# Patient Record
Sex: Female | Born: 1985 | Race: White | Hispanic: No | Marital: Married | Smoking: Never smoker
Health system: Southern US, Community
[De-identification: ages and names within clinical notes are randomized; demographics above are authoritative.]

## PROBLEM LIST (undated history)

## (undated) ENCOUNTER — Inpatient Hospital Stay (HOSPITAL_COMMUNITY): Payer: Self-pay

## (undated) DIAGNOSIS — K589 Irritable bowel syndrome without diarrhea: Secondary | ICD-10-CM

## (undated) DIAGNOSIS — O4690 Antepartum hemorrhage, unspecified, unspecified trimester: Secondary | ICD-10-CM

## (undated) DIAGNOSIS — N390 Urinary tract infection, site not specified: Secondary | ICD-10-CM

## (undated) DIAGNOSIS — Z8619 Personal history of other infectious and parasitic diseases: Secondary | ICD-10-CM

## (undated) DIAGNOSIS — I8390 Asymptomatic varicose veins of unspecified lower extremity: Secondary | ICD-10-CM

## (undated) DIAGNOSIS — F4322 Adjustment disorder with anxiety: Secondary | ICD-10-CM

## (undated) DIAGNOSIS — Z87898 Personal history of other specified conditions: Secondary | ICD-10-CM

## (undated) DIAGNOSIS — F909 Attention-deficit hyperactivity disorder, unspecified type: Secondary | ICD-10-CM

## (undated) DIAGNOSIS — J309 Allergic rhinitis, unspecified: Secondary | ICD-10-CM

## (undated) DIAGNOSIS — IMO0001 Reserved for inherently not codable concepts without codable children: Secondary | ICD-10-CM

## (undated) DIAGNOSIS — R51 Headache: Secondary | ICD-10-CM

## (undated) DIAGNOSIS — G5790 Unspecified mononeuropathy of unspecified lower limb: Secondary | ICD-10-CM

## (undated) DIAGNOSIS — N3010 Interstitial cystitis (chronic) without hematuria: Secondary | ICD-10-CM

## (undated) DIAGNOSIS — K602 Anal fissure, unspecified: Secondary | ICD-10-CM

## (undated) DIAGNOSIS — Z111 Encounter for screening for respiratory tuberculosis: Secondary | ICD-10-CM

## (undated) DIAGNOSIS — M545 Low back pain, unspecified: Secondary | ICD-10-CM

## (undated) DIAGNOSIS — N200 Calculus of kidney: Secondary | ICD-10-CM

## (undated) DIAGNOSIS — D649 Anemia, unspecified: Secondary | ICD-10-CM

## (undated) DIAGNOSIS — R0789 Other chest pain: Secondary | ICD-10-CM

## (undated) DIAGNOSIS — I493 Ventricular premature depolarization: Secondary | ICD-10-CM

## (undated) HISTORY — DX: Personal history of other specified conditions: Z87.898

## (undated) HISTORY — DX: Encounter for screening for respiratory tuberculosis: Z11.1

## (undated) HISTORY — DX: Ventricular premature depolarization: I49.3

## (undated) HISTORY — DX: Allergic rhinitis, unspecified: J30.9

## (undated) HISTORY — DX: Attention-deficit hyperactivity disorder, unspecified type: F90.9

## (undated) HISTORY — DX: Urinary tract infection, site not specified: N39.0

## (undated) HISTORY — DX: Reserved for inherently not codable concepts without codable children: IMO0001

## (undated) HISTORY — DX: Interstitial cystitis (chronic) without hematuria: N30.10

## (undated) HISTORY — DX: Anemia, unspecified: D64.9

## (undated) HISTORY — DX: Personal history of other infectious and parasitic diseases: Z86.19

## (undated) HISTORY — PX: BREAST SURGERY: SHX581

## (undated) HISTORY — DX: Antepartum hemorrhage, unspecified, unspecified trimester: O46.90

## (undated) HISTORY — DX: Low back pain, unspecified: M54.50

## (undated) HISTORY — DX: Irritable bowel syndrome, unspecified: K58.9

## (undated) HISTORY — PX: BREAST IMPLANT REMOVAL: SUR1101

## (undated) HISTORY — DX: Unspecified mononeuropathy of unspecified lower limb: G57.90

## (undated) HISTORY — DX: Low back pain: M54.5

## (undated) HISTORY — PX: ENDOVENOUS ABLATION SAPHENOUS VEIN W/ LASER: SUR449

## (undated) HISTORY — DX: Headache: R51

## (undated) HISTORY — DX: Anal fissure, unspecified: K60.2

## (undated) HISTORY — DX: Adjustment disorder with anxiety: F43.22

## (undated) HISTORY — DX: Other chest pain: R07.89

## (undated) HISTORY — DX: Asymptomatic varicose veins of unspecified lower extremity: I83.90

---
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
## 2005-01-04 ENCOUNTER — Ambulatory Visit: Payer: Self-pay | Admitting: Cardiology

## 2005-01-15 ENCOUNTER — Ambulatory Visit: Payer: Self-pay | Admitting: Gastroenterology

## 2005-01-17 ENCOUNTER — Emergency Department (HOSPITAL_COMMUNITY): Admission: EM | Admit: 2005-01-17 | Discharge: 2005-01-18 | Payer: Self-pay | Admitting: Emergency Medicine

## 2005-02-04 ENCOUNTER — Other Ambulatory Visit: Admission: RE | Admit: 2005-02-04 | Discharge: 2005-02-04 | Payer: Self-pay | Admitting: Obstetrics and Gynecology

## 2005-05-01 ENCOUNTER — Ambulatory Visit: Payer: Self-pay | Admitting: Internal Medicine

## 2005-06-03 ENCOUNTER — Encounter: Payer: Self-pay | Admitting: Internal Medicine

## 2005-06-07 ENCOUNTER — Ambulatory Visit (HOSPITAL_COMMUNITY): Admission: RE | Admit: 2005-06-07 | Discharge: 2005-06-07 | Payer: Self-pay | Admitting: Urology

## 2005-10-21 ENCOUNTER — Ambulatory Visit: Payer: Self-pay | Admitting: Internal Medicine

## 2006-02-06 ENCOUNTER — Other Ambulatory Visit: Admission: RE | Admit: 2006-02-06 | Discharge: 2006-02-06 | Payer: Self-pay | Admitting: Obstetrics and Gynecology

## 2006-04-05 ENCOUNTER — Emergency Department (HOSPITAL_COMMUNITY): Admission: EM | Admit: 2006-04-05 | Discharge: 2006-04-05 | Payer: Self-pay | Admitting: Emergency Medicine

## 2006-05-30 ENCOUNTER — Ambulatory Visit: Payer: Self-pay | Admitting: Internal Medicine

## 2006-06-04 ENCOUNTER — Ambulatory Visit: Payer: Self-pay | Admitting: Internal Medicine

## 2006-06-14 ENCOUNTER — Emergency Department (HOSPITAL_COMMUNITY): Admission: EM | Admit: 2006-06-14 | Discharge: 2006-06-14 | Payer: Self-pay | Admitting: Emergency Medicine

## 2006-06-16 ENCOUNTER — Ambulatory Visit: Payer: Self-pay | Admitting: Internal Medicine

## 2006-06-19 ENCOUNTER — Ambulatory Visit: Payer: Self-pay | Admitting: Gastroenterology

## 2006-06-19 LAB — CONVERTED CEMR LAB
Crystals: NEGATIVE
Hemoglobin, Urine: NEGATIVE
Ketones, ur: NEGATIVE mg/dL
Nitrite: POSITIVE — AB
Specific Gravity, Urine: 1.02 (ref 1.000–1.03)
Total Protein, Urine: NEGATIVE mg/dL
Urine Glucose: NEGATIVE mg/dL
Urobilinogen, UA: 0.2 (ref 0.0–1.0)
pH: 6.5 (ref 5.0–8.0)

## 2006-06-23 ENCOUNTER — Ambulatory Visit: Payer: Self-pay | Admitting: Gastroenterology

## 2006-07-05 ENCOUNTER — Ambulatory Visit: Payer: Self-pay | Admitting: Family Medicine

## 2006-07-10 ENCOUNTER — Ambulatory Visit: Payer: Self-pay | Admitting: Gastroenterology

## 2006-10-07 ENCOUNTER — Ambulatory Visit: Payer: Self-pay | Admitting: Internal Medicine

## 2007-01-09 ENCOUNTER — Ambulatory Visit: Payer: Self-pay | Admitting: Family Medicine

## 2007-01-14 ENCOUNTER — Ambulatory Visit: Payer: Self-pay | Admitting: Internal Medicine

## 2007-01-23 DIAGNOSIS — R51 Headache: Secondary | ICD-10-CM

## 2007-01-23 DIAGNOSIS — R519 Headache, unspecified: Secondary | ICD-10-CM | POA: Insufficient documentation

## 2007-01-29 ENCOUNTER — Encounter: Payer: Self-pay | Admitting: Internal Medicine

## 2007-02-28 IMAGING — US US TRANSVAGINAL NON-OB
2 series · 13 of 25 positions shown · non-contrast
Comparison: None.

CLINICAL DATA: Left lower quadrant abdominal abdominal and left pelvic pain.
Recent normal abdominal ultrasound.

TRANSABDOMINAL AND TRANSVAGINAL PELVIC ULTRASOUND 11/28/2004:
TECHNIQUE: Initially, transabdominal imaging was performed using the bladder as
an acoustical window. Subsequently, the bladder was emptied and transvaginal
imaging was performed.

[Series 1: unknown · 0.13mm/px · 8 of 36 slices shown (1 of 2)]
[im 1/36]
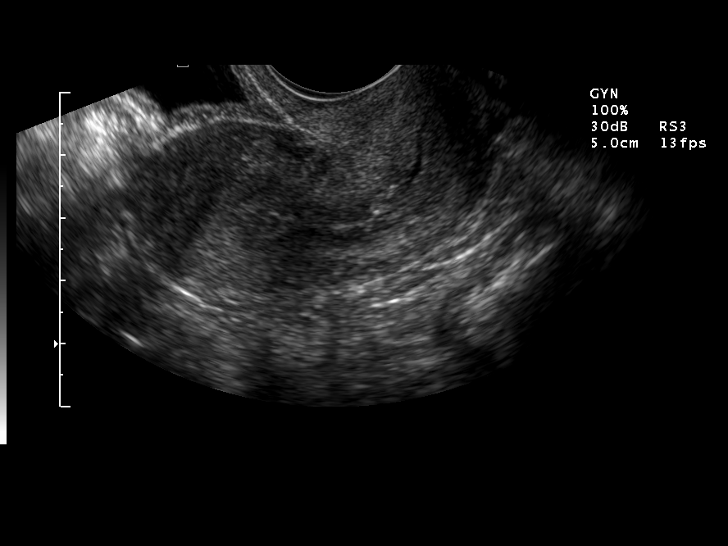
[im 5/36]
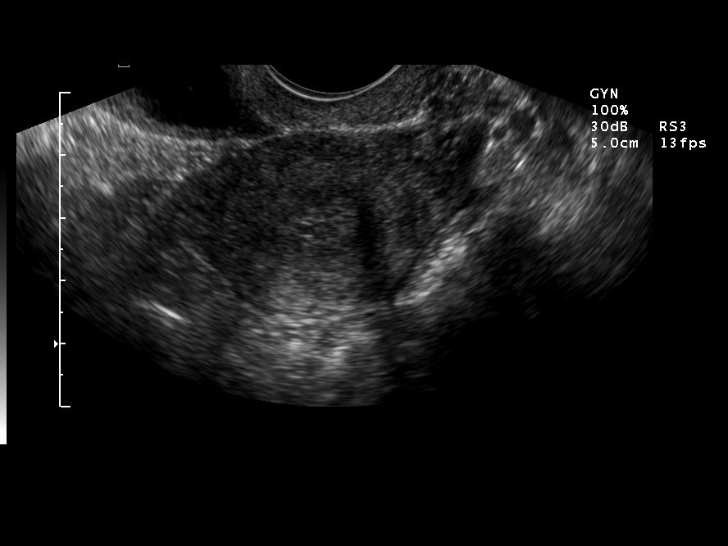
[im 10/36]
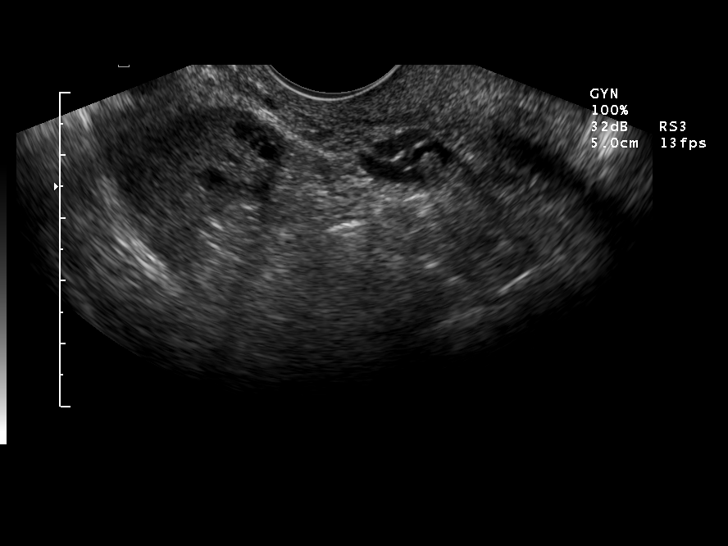
[im 15/36]
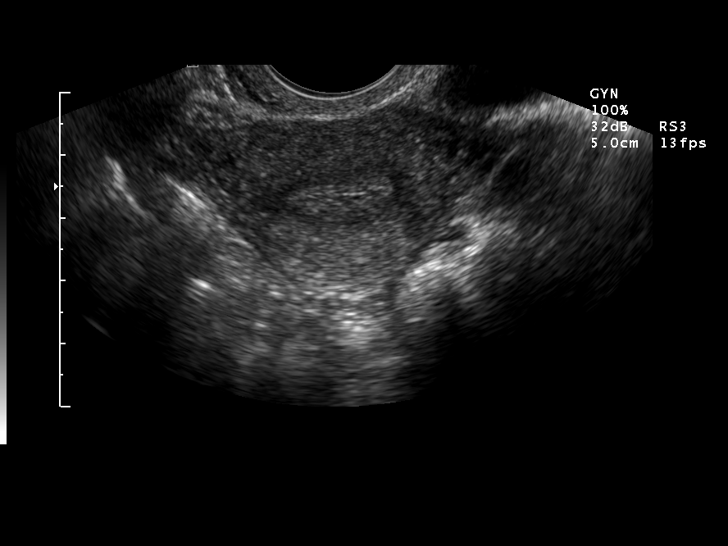
[im 19/36]
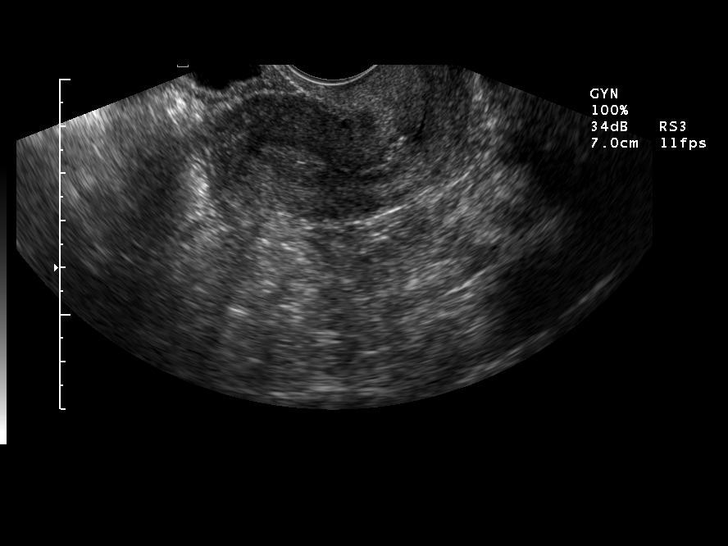
[im 24/36]
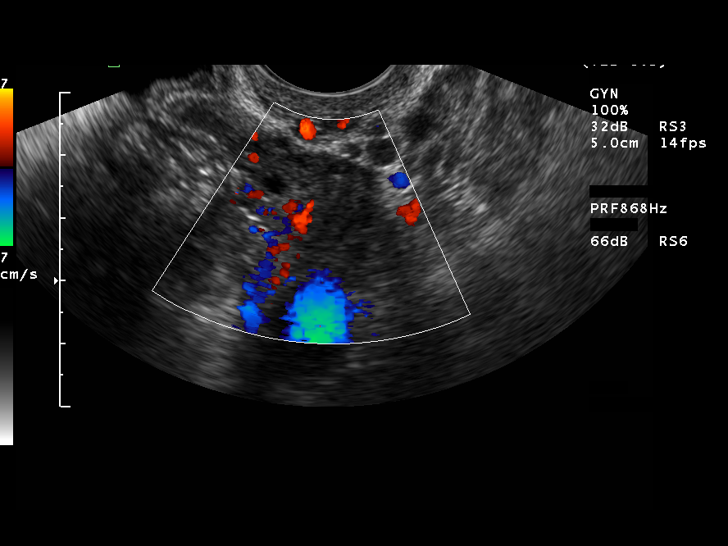
[im 29/36]
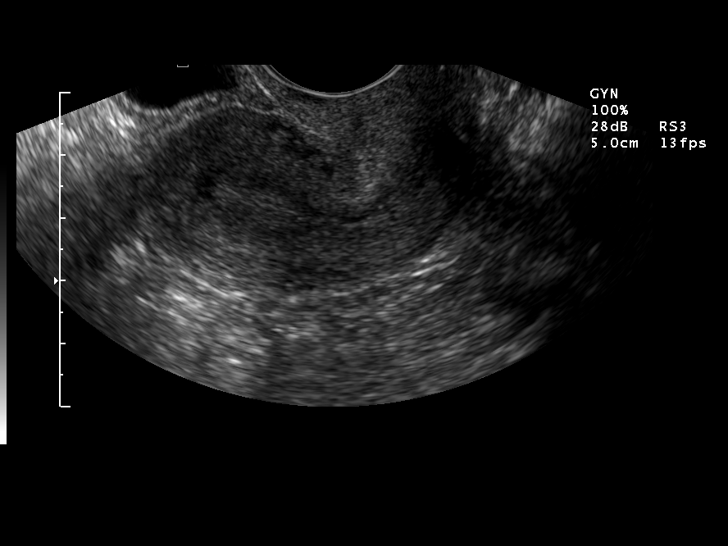
[im 33/36]
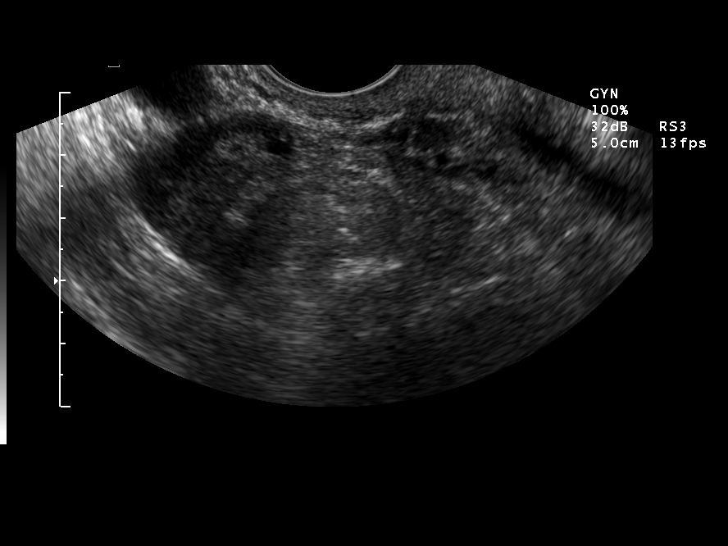

[Series 1: unknown · 0.35mm/px · 5 of 19 slices shown (2 of 2)]
[im 1/19]
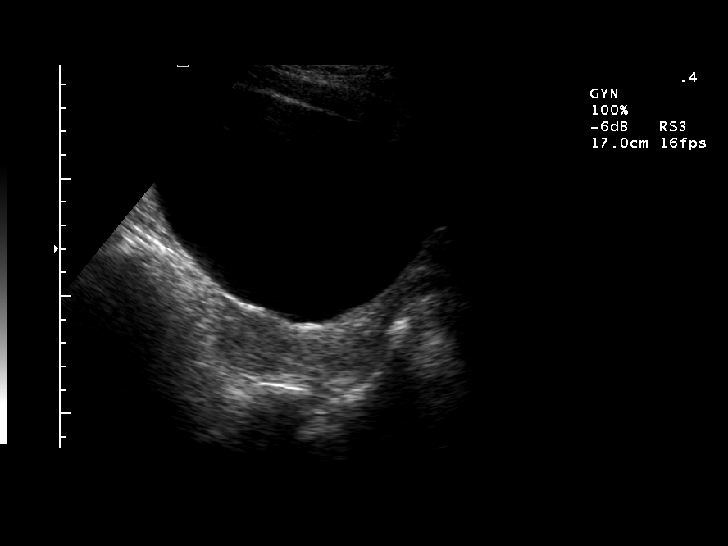
[im 5/19]
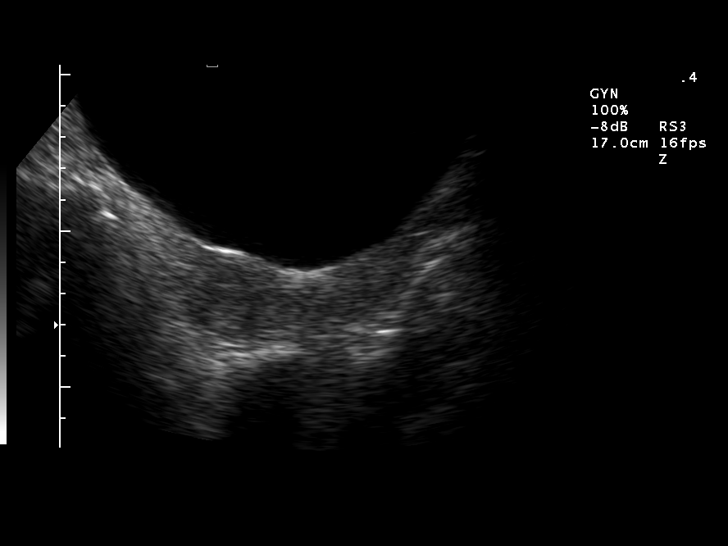
[im 10/19]
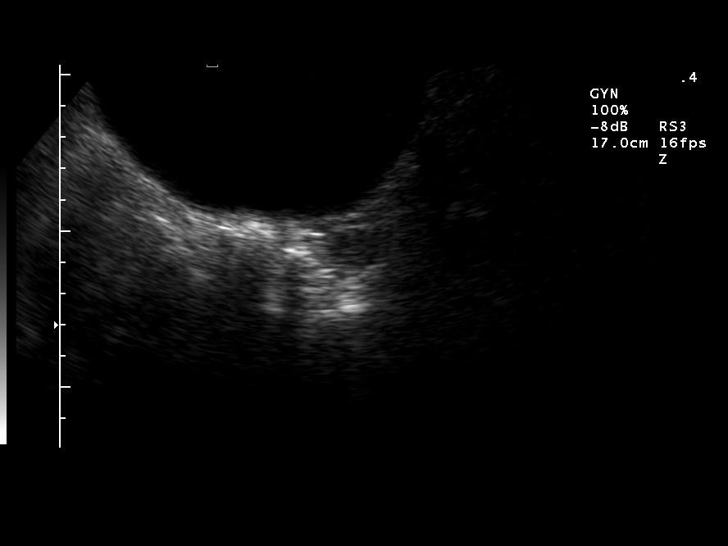
[im 14/19]
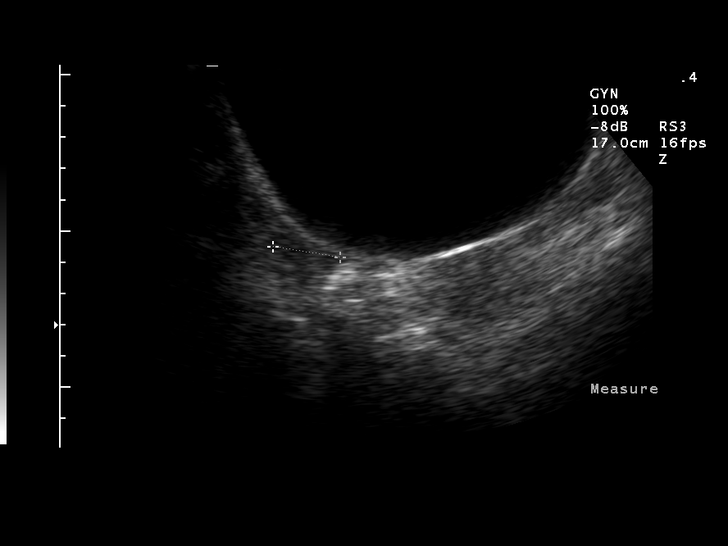
[im 19/19]
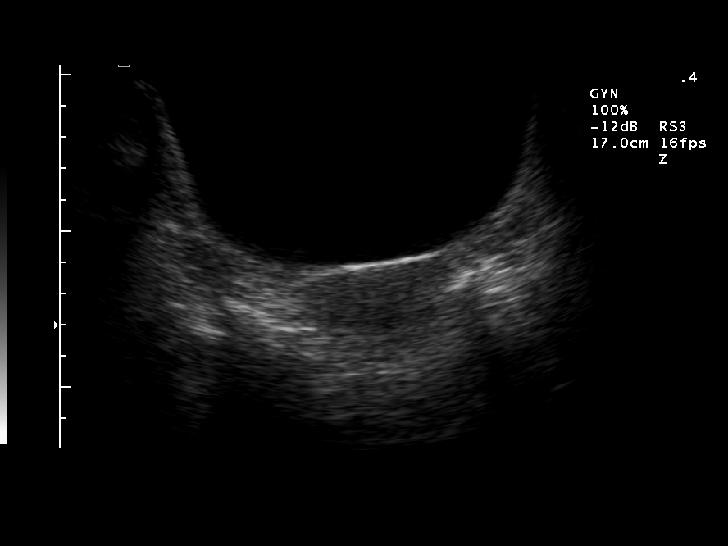

[13 of 25 positions shown; findings below may reference images not displayed]

FINDINGS: The uterus is normal in size and appearance and measures
approximately 5.9 x 3.1 x 3.9 cm. No focal myometrial masses are identified. The
endometrium is normal and trilayered and measures approximately 7 mm in
thickness.

Within the right ovary there is an approximately 0.7 x 0.5 x 0.6 cm echogenic
focus which likely represents an involuting follicle. No significant
abnormalities are identified involving the right ovary. It measures
approximately 2.7 x 2.3 x 2.8 cm. The left ovary is normal in size in appearance
and contains several follicles; it measures approximately 2.7 x 1.7 x 2.2 cm. No
adnexal masses or free fluid were identified.
IMPRESSION: Normal pelvic ultrasound.

## 2007-04-15 ENCOUNTER — Encounter: Admission: RE | Admit: 2007-04-15 | Discharge: 2007-04-15 | Payer: Self-pay | Admitting: Sports Medicine

## 2007-05-07 ENCOUNTER — Encounter: Payer: Self-pay | Admitting: Internal Medicine

## 2008-02-12 ENCOUNTER — Ambulatory Visit: Payer: Self-pay | Admitting: Family Medicine

## 2008-02-12 DIAGNOSIS — F909 Attention-deficit hyperactivity disorder, unspecified type: Secondary | ICD-10-CM | POA: Insufficient documentation

## 2008-08-16 ENCOUNTER — Encounter: Payer: Self-pay | Admitting: Internal Medicine

## 2008-08-17 ENCOUNTER — Ambulatory Visit: Payer: Self-pay | Admitting: Internal Medicine

## 2008-08-17 DIAGNOSIS — G5790 Unspecified mononeuropathy of unspecified lower limb: Secondary | ICD-10-CM | POA: Insufficient documentation

## 2008-08-17 DIAGNOSIS — IMO0001 Reserved for inherently not codable concepts without codable children: Secondary | ICD-10-CM

## 2008-08-17 DIAGNOSIS — N390 Urinary tract infection, site not specified: Secondary | ICD-10-CM | POA: Insufficient documentation

## 2008-08-17 DIAGNOSIS — J309 Allergic rhinitis, unspecified: Secondary | ICD-10-CM | POA: Insufficient documentation

## 2008-09-08 ENCOUNTER — Ambulatory Visit: Payer: Self-pay | Admitting: Internal Medicine

## 2008-11-23 ENCOUNTER — Telehealth: Payer: Self-pay | Admitting: Internal Medicine

## 2009-05-16 ENCOUNTER — Telehealth: Payer: Self-pay | Admitting: Internal Medicine

## 2009-07-01 HISTORY — PX: BREAST ENHANCEMENT SURGERY: SHX7

## 2009-07-04 ENCOUNTER — Ambulatory Visit: Payer: Self-pay | Admitting: Internal Medicine

## 2009-07-31 ENCOUNTER — Emergency Department (HOSPITAL_COMMUNITY): Admission: EM | Admit: 2009-07-31 | Discharge: 2009-07-31 | Payer: Self-pay | Admitting: Emergency Medicine

## 2009-11-10 ENCOUNTER — Ambulatory Visit: Payer: Self-pay | Admitting: Internal Medicine

## 2009-11-10 DIAGNOSIS — K602 Anal fissure, unspecified: Secondary | ICD-10-CM | POA: Insufficient documentation

## 2009-12-04 ENCOUNTER — Telehealth: Payer: Self-pay | Admitting: Internal Medicine

## 2010-01-02 ENCOUNTER — Ambulatory Visit: Payer: Self-pay | Admitting: Internal Medicine

## 2010-01-02 DIAGNOSIS — J45990 Exercise induced bronchospasm: Secondary | ICD-10-CM

## 2010-02-21 ENCOUNTER — Telehealth: Payer: Self-pay | Admitting: Internal Medicine

## 2010-03-26 ENCOUNTER — Telehealth: Payer: Self-pay | Admitting: Internal Medicine

## 2010-04-03 ENCOUNTER — Telehealth: Payer: Self-pay | Admitting: *Deleted

## 2010-05-08 ENCOUNTER — Ambulatory Visit: Payer: Self-pay | Admitting: Internal Medicine

## 2010-05-08 DIAGNOSIS — M549 Dorsalgia, unspecified: Secondary | ICD-10-CM | POA: Insufficient documentation

## 2010-05-10 ENCOUNTER — Ambulatory Visit: Payer: Self-pay | Admitting: Internal Medicine

## 2010-05-17 ENCOUNTER — Ambulatory Visit: Payer: Self-pay | Admitting: Internal Medicine

## 2010-05-17 ENCOUNTER — Telehealth: Payer: Self-pay | Admitting: *Deleted

## 2010-05-17 LAB — CONVERTED CEMR LAB
Bilirubin Urine: NEGATIVE
Glucose, Urine, Semiquant: NEGATIVE
Ketones, urine, test strip: NEGATIVE
Nitrite: NEGATIVE
Protein, U semiquant: NEGATIVE
Specific Gravity, Urine: 1.01
Urobilinogen, UA: 0.2
pH: 7.5

## 2010-06-07 ENCOUNTER — Encounter: Payer: Self-pay | Admitting: Internal Medicine

## 2010-07-31 NOTE — Assessment & Plan Note (Signed)
Summary: F/U FOR MED REFILL//SLM   Vital Signs:  Patient profile:   25 year old female Menstrual status:  regular LMP:     06/12/2009 Height:      67.75 inches Weight:      184 pounds BMI:     28.29 Pulse rate:   60 / minute BP sitting:   100 / 60  (right arm) Cuff size:   regular  Vitals Entered By: Romualdo Bolk, CMA (AAMA) (July 04, 2009 1:41 PM) CC: Follow-up visit on add meds LMP (date): 06/12/2009 LMP - Character: light Menarche (age onset years): 14-15   Menses interval (days): 28 Menstrual flow (days): 3 Enter LMP: 06/12/2009   History of Present Illness: Alexandra [name] comes in for follow up  medication. Since last visit  here  there have been no major changes in health status  .  Last ov 3.10  she has graduated from college . exercising some but not lacrosse.  #ADHD:uses  medication and works at  [employer] .   considering pharmacy school  working 35-40 .   takes med for work.  no sig chest pain .  no associated symptom /  #Sleep  ok .   No mood    or   rare  cp .    with  breathing.     still physically active.  #Asthma allergy stable. #Sees gyne  reg no problems.  on hormonal therapy nuvaring #? Peripheral neuropathy feet: no change .    Preventive Screening-Counseling & Management  Alcohol-Tobacco     Alcohol drinks/day: 0     Smoking Status: never  Caffeine-Diet-Exercise     Caffeine use/day: 1     Does Patient Exercise: yes     Type of exercise: running     Exercise (avg: min/session): >60     Times/week: 7  Current Medications (verified): 1)  Macrobid 100 Mg Caps (Nitrofurantoin Monohyd Macro) .... Take 1 Capsule By Mouth Twice A Day As Needed 2)  Neurontin 600 Mg  Tabs (Gabapentin) .... One By Mouth Three Times A Day Per  Neurology Dr  Estella Husk 3)  Vicodin Es 7.5-750 Mg  Tabs (Hydrocodone-Acetaminophen) .... Prn 4)  Voltaren 75 Mg  Tbec (Diclofenac Sodium) .... As Needed For Back 5)  Adderall 20 Mg  Tabs (Amphetamine-Dextroamphetamine)  .... Take 1 Tablet By Mouth Two Times A Day 6)  Nuvaring 0.12-0.015 Mg/24hr Ring (Etonogestrel-Ethinyl Estradiol) .... As Directed 7)  Ventolin Hfa 108 (90 Base) Mcg/act Aers (Albuterol Sulfate) .Marland Kitchen.. 1-2 Puffs Pre Exercise   Trial  or As Directed 8)  Flonase 50 Mcg/act Susp (Fluticasone Propionate) .... 2 Spray Each Nares Q D  For Nose Allergy 9)  Singulair 10 Mg Tabs (Montelukast Sodium) 10)  Adderall 20 Mg Tabs (Amphetamine-Dextroamphetamine) .Marland Kitchen.. 1 By Mouth Two Times A Day Fill On or After 08/04/2009 11)  Adderall 20 Mg Tabs (Amphetamine-Dextroamphetamine) .Marland Kitchen.. 1 By Mouth Two Times A Day Fill On or After 09/01/2009  Allergies (verified): No Known Drug Allergies  Past History:  Past medical, surgical, family and social histories (including risk factors) reviewed, and no changes noted (except as noted below).  Past Medical History: Headache IBS  colonoscopy Jarold Motto ADD Echo for PVCs normal Cdifficile 2007  Chickenpox  recurrent UTIs  Earlene Plater 2006 Foot pain  ? neuropathy ? fibromyalgia  Recurrent UTI hx   Past History:  Care Management: Neurology: [facility]. Gynecology:[facility]son.  Family History: Reviewed history from 09/08/2008 and no changes required. Family  History Diabetes 1st degree relative    No sudden death arrythmias  SD     Father:  high lipids   ? inhaler     Mother:  HBP     Siblings: well.        Social History: Reviewed history from 09/08/2008 and no changes required.  Single    [employer] college   finished school now at home . hh of 3     2 hh    pet dog and cat exposure     no alcohol    ? sweet tea a day max     goes to gyme at times.        Review of Systems  The patient denies anorexia, fever, weight loss, weight gain, vision loss, decreased hearing, hoarseness, syncope, peripheral edema, prolonged cough, hemoptysis, abdominal pain, melena, hematochezia, severe indigestion/heartburn, hematuria, suspicious skin lesions, transient  blindness, difficulty walking, unusual weight change, abnormal bleeding, enlarged lymph nodes, angioedema, and breast masses.    Physical Exam  General:  Well-developed,well-nourished,in no acute distress; alert,appropriate and cooperative throughout examination Head:  normocephalic and atraumatic.   Eyes:  vision grossly intact, pupils equal, and pupils round.   Ears:  R ear normal, L ear normal, and no external deformities.   Mouth:  pharynx pink and moist.   Neck:  No deformities, masses, or tenderness noted. Lungs:  Normal respiratory effort, chest expands symmetrically. Lungs are clear to auscultation, no crackles or wheezes.no dullness.   Heart:  Normal rate and regular rhythm. S1 and S2 normal without gallop, murmur, click, rub or other extra sounds.no lifts.   Abdomen:  Bowel sounds positive,abdomen soft and non-tender without masses, organomegaly or   noted. Extremities:  no clubbing cyanosis or edema  Neurologic:  alert & oriented X3, strength normal in all extremities, and gait normal.   Skin:  turgor normal and color normal.   Cervical Nodes:  No lymphadenopathy noted Psych:  Oriented X3, good eye contact, not anxious appearing, and not depressed appearing.     Impression & Recommendations:  Problem # 1:  ATTENTION DEFICIT HYPERACTIVITY DISORDER, ADULT (ICD-314.01) stable  with med  helping and no untoward se.    Problem # 2:  ALLERGIC RHINITIS (ICD-477.9) stable Her updated medication list for this problem includes:    Flonase 50 Mcg/act Susp (Fluticasone propionate) .Marland Kitchen... 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 Take 1 capsule by mouth twice a day as needed  Complete Medication List: 1)  Macrobid 100 Mg Caps (Nitrofurantoin monohyd macro) .... Take 1 capsule by mouth  twice a day as needed 2)  Neurontin 600 Mg Tabs (Gabapentin) .... One by mouth three times a day per  neurology dr  Estella Husk 3)  Vicodin Es 7.5-750 Mg Tabs (Hydrocodone-acetaminophen) .... Prn 4)  Voltaren 75 Mg Tbec (Diclofenac sodium) .... As needed for back 5)  Adderall 20 Mg Tabs (Amphetamine-dextroamphetamine) .... Take 1 tablet by mouth two times a day 6)  Nuvaring 0.12-0.015 Mg/24hr Ring (Etonogestrel-ethinyl estradiol) .... As directed 7)  Ventolin Hfa 108 (90 Base) Mcg/act Aers (Albuterol sulfate) .Marland Kitchen.. 1-2 puffs pre exercise   trial  or as directed 8)  Flonase 50 Mcg/act Susp (Fluticasone propionate) .... 2 spray each nares q d  for nose allergy 9)  Singulair 10 Mg Tabs (Montelukast sodium) 10)  Adderall 20 Mg Tabs (Amphetamine-dextroamphetamine) .Marland Kitchen.. 1 by mouth two times a day fill on or after 08/04/2009 11)  Adderall 20 Mg Tabs (Amphetamine-dextroamphetamine) .Marland Kitchen.. 1 by mouth two times a day fill on or after 09/01/2009  Patient Instructions: 1)  Please schedule a follow-up appointment in 6 months .  Prescriptions: ADDERALL 20 MG TABS (AMPHETAMINE-DEXTROAMPHETAMINE) 1 by mouth two times a day FILL ON OR AFTER 09/01/2009  #60 x 0   Entered by:   Romualdo Bolk, CMA (AAMA)   Authorized by:   Madelin Headings MD   Signed by:   Romualdo Bolk, CMA (AAMA) on 07/04/2009   Method used:   Print then Give to Patient   RxID:   [id number] ADDERALL 20 MG TABS (AMPHETAMINE-DEXTROAMPHETAMINE) 1 by mouth two times a day FILL ON OR AFTER 08/04/2009  #60 x 0   Entered by:   Romualdo Bolk, CMA (AAMA)   Authorized by:   Madelin Headings MD   Signed by:   Romualdo Bolk, CMA (AAMA) on 07/04/2009   Method used:   Print then Give to Patient   RxID:   [id number] ADDERALL 20 MG  TABS (AMPHETAMINE-DEXTROAMPHETAMINE) Take 1 tablet by mouth two times a day  #60 x 0   Entered by:   Romualdo Bolk, CMA (AAMA)   Authorized by:   Madelin Headings MD   Signed by:   Romualdo Bolk, CMA  (AAMA) on 07/04/2009   Method used:   Print then Give to Patient   RxID:   [id number]

## 2010-07-31 NOTE — Progress Notes (Signed)
Summary: refill  Phone Note Refill Request Call back at [phone] Message from:  Patient---live call  Refills Requested: Medication #1:  ADDERALL 20 MG  TABS Take 1 tablet by mouth two times a day call pt when ready.  Initial call taken by: Warnell Forester,  February 21, 2010 3:31 PM  Follow-up for Phone Call        LM on machine rx ready to pick up Follow-up by: Romualdo Bolk, CMA Duncan Dull),  February 21, 2010 5:36 PM    Prescriptions: ADDERALL 20 MG  TABS (AMPHETAMINE-DEXTROAMPHETAMINE) Take 1 tablet by mouth two times a day  #60 x 0   Entered by:   Romualdo Bolk, CMA (AAMA)   Authorized by:   Madelin Headings MD   Signed by:   Romualdo Bolk, CMA (AAMA) on 02/21/2010   Method used:   Print then Give to Patient   RxID:   [id number]

## 2010-07-31 NOTE — Progress Notes (Signed)
Summary: refill  Phone Note Refill Request Call back at [phone] Message from:  Patient---live call  Refills Requested: Medication #1:  ADDERALL 20 MG  TABS Take 1 tablet by mouth two times a day call pt when ready.  Initial call taken by: Warnell Forester,  December 04, 2009 9:12 AM  Follow-up for Phone Call        Pt needs a meds ck before next refill. Left message on machine that rx is ready to pick up. Follow-up by: Romualdo Bolk, CMA (AAMA),  December 04, 2009 9:14 AM    Prescriptions: ADDERALL 20 MG  TABS (AMPHETAMINE-DEXTROAMPHETAMINE) Take 1 tablet by mouth two times a day  #60 x 0   Entered by:   Romualdo Bolk, CMA (AAMA)   Authorized by:   Madelin Headings MD   Signed by:   Romualdo Bolk, CMA (AAMA) on 12/04/2009   Method used:   Print then Give to Patient   RxID:   [id number]

## 2010-07-31 NOTE — Assessment & Plan Note (Signed)
Summary: UTI/dm   Vital Signs:  Patient profile:   25 year old female Menstrual status:  regular Weight:      175 pounds Temp:     97.7 degrees F oral BP sitting:   120 / 70  (left arm) Cuff size:   regular  Vitals Entered By: Duard Brady LPN (May 17, 2010 3:12 PM) CC: c/o back pain - feels like fire Is Patient Diabetic? No   CC:  c/o back pain - feels like fire.  History of Present Illness: 25 year old patient who has a history of chronic UTI, as well as chronic low back pain.  She is followed by neurology for a peripheral neuropathy and has a history of ADD as well as fibromyalgia.  For the past few weeks.  She has had some intermittent burning right lumbar pain.  This has intensified over the past 3 days.  She works at a [employer] standing from 9 to 5- and she feels  that the  prolonged standing aggravates the pain  Preventive Screening-Counseling & Management  Alcohol-Tobacco     Smoking Status: never  Allergies (verified): No Known Drug Allergies  Past History:  Past Medical History: Reviewed history from 01/02/2010 and no changes required. Headache IBS  colonoscopy Jarold Motto ADD Echo for PVCs normal Cdifficile 2007  Chickenpox  recurrent UTIs  Earlene Plater 2006 Foot pain  ? neuropathy  back radiating pain  to feet  ? fibromyalgia  Recurrent UTI hx   Family History: Reviewed history from 09/08/2008 and no changes required. Family History Diabetes 1st degree relative    No sudden death arrythmias  SD     Father:  high lipids   ? inhaler     Mother:  HBP     Siblings: well.        Social History: Reviewed history from 11/10/2009 and no changes required.  Single    [employer] college   finished school now at home . hh of 3     2 hh    pet dog and cat exposure     no alcohol    ? sweet tea a day max     goes to gym at times.  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        Review of Systems  The patient denies anorexia, fever, weight loss, weight  gain, vision loss, decreased hearing, hoarseness, chest pain, syncope, dyspnea on exertion, peripheral edema, prolonged cough, headaches, hemoptysis, abdominal pain, melena, hematochezia, severe indigestion/heartburn, hematuria, incontinence, genital sores, muscle weakness, suspicious skin lesions, transient blindness, difficulty walking, depression, unusual weight change, abnormal bleeding, enlarged lymph nodes, angioedema, and breast masses.    Physical Exam  General:  overweight-appearing.  overweight-appearing.   Msk:   Tenderness in the right lumbar area to palpation.  No inflammatory signs.  Straight leg testing negative.  Range-of-motion of the right hip intact;  internal rotation to 10 aggravate the pain somewhat   Impression & Recommendations:  Problem # 1:  BACK PAIN, LUMBOSACRAL, CHRONIC (ICD-724.5)  Her updated medication list for this problem includes:    Vicodin Es 7.5-750 Mg Tabs (Hydrocodone-acetaminophen) .Marland Kitchen... Prn    Voltaren 75 Mg Tbec (Diclofenac sodium) .Marland Kitchen... As needed for back  not taking  currently  05/2010  Orders: UA Dipstick W/ Micro (manual) (24401) Depo- Medrol 80mg  (J1040) Admin of Therapeutic Inj  intramuscular or subcutaneous (02725)  Her updated medication list for this problem includes:    Vicodin Es 7.5-750 Mg Tabs (Hydrocodone-acetaminophen) .Marland Kitchen... Prn  Voltaren 75 Mg Tbec (Diclofenac sodium) .Marland Kitchen... 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 Take 1 capsule by mouth twice a day as needed  Her updated medication list for this problem includes:    Macrobid 100 Mg Caps (Nitrofurantoin monohyd macro) .Marland Kitchen... Take 1 capsule by mouth twice a day as needed  Complete Medication List: 1)  Macrobid 100 Mg Caps (Nitrofurantoin monohyd macro) .... Take 1 capsule by mouth  twice a day as needed 2)  Neurontin 600 Mg Tabs (Gabapentin) .... One by mouth three times a day per  neurology dr  Estella Husk  for back pain. 3)  Vicodin Es 7.5-750 Mg Tabs (Hydrocodone-acetaminophen) .... Prn 4)  Voltaren 75 Mg Tbec (Diclofenac sodium) .... As needed for back  not taking  currently  05/2010 5)  Adderall 20 Mg Tabs (Amphetamine-dextroamphetamine) .... Take 1 tablet by mouth two times a day 6)  Nuvaring 0.12-0.015 Mg/24hr Ring (Etonogestrel-ethinyl estradiol) .... As directed 7)  Ventolin Hfa 108 (90 Base) Mcg/act Aers (Albuterol sulfate) .Marland Kitchen.. 1-2 puffs pre exercise   trial  or as directed 8)  Flonase 50 Mcg/act Susp (Fluticasone propionate) .... 2 spray each nares q d  for nose allergy 9)  Singulair 10 Mg Tabs (Montelukast sodium) 10)  Adderall 30 Mg Tabs (Amphetamine-dextroamphetamine) .Marland Kitchen.. 1 by mouth qam and 1 1/2 in pm 11)  Adderall 20 Mg Tabs (Amphetamine-dextroamphetamine) .Marland Kitchen.. 1 by mouth two times a day fill on or after 05/26/10 12)  Hemorrhoidal-hc 25 Mg Supp (Hydrocortisone acetate) .... Use two times a day for up to 2 weeks 13)  Sumatriptan Succinate 100 Mg Tabs (Sumatriptan succinate) .Marland Kitchen.. 1 by mouth  at onset  and can repeat in 2-4 hours  Patient Instructions: 1)  Most patients (90%) with low back pain will improve with time (2-6 weeks). Keep active but avoid activities that are painful. Apply moist heat and/or ice to lower back several times a day.   Medication Administration  Injection # 1:    Medication: Depo- Medrol 80mg     Diagnosis: BACK PAIN, LUMBOSACRAL, CHRONIC (ICD-724.5)    Route: IM    Site: RUOQ gluteus    Exp Date: 10/2012    Lot #: [id number]    Mfr: [employer]    Patient tolerated injection without complications    Given by: Duard Brady LPN (May 17, 2010 4:03 PM)  Orders Added: 1)  UA Dipstick W/ Micro (manual) [81000] 2)  Est. Patient Level III [45409] 3)  Depo- Medrol 80mg  [J1040] 4)  Admin of Therapeutic Inj  intramuscular or  subcutaneous [96372]    Laboratory Results   Urine Tests  Date/Time Received: May 17, 2010 3:25 PM  Date/Time Reported: May 17, 2010 3:25 PM   Routine Urinalysis   Color: yellow Appearance: Clear Glucose: negative   (Normal Range: Negative) Bilirubin: negative   (Normal Range: Negative) Ketone: negative   (Normal Range: Negative) Spec. Gravity: 1.010   (Normal Range: 1.003-1.035) Blood: trace-lysed   (Normal Range: Negative) pH: 7.5   (Normal Range: 5.0-8.0) Protein: negative   (Normal Range: Negative) Urobilinogen: 0.2   (Normal Range: 0-1) Nitrite: negative   (Normal Range: Negative) Leukocyte Esterace: moderate   (Normal Range: Negative)

## 2010-07-31 NOTE — Letter (Signed)
Summary: Out of Work  Adult nurse at [employer]  [address]   [place], [place] [address]   Phone: [phone]  Fax: [phone]    May 17, 2010   Employee:  Eloise Levels    To Whom It May Concern:   For Medical reasons, please excuse the above named employee from work for the following dates:  Start:   05-18-10  End:   05-21-10  If you need additional information, please feel free to contact our office.         Sincerely,    Gordy Savers  MD

## 2010-07-31 NOTE — Assessment & Plan Note (Signed)
Summary: 6 month rov/njr   Vital Signs:  Patient profile:   25 year old female Menstrual status:  regular Weight:      175 pounds Pulse rate:   60 / minute Pulse rhythm:   regular BP sitting:   90 / 66  (left arm) Cuff size:   regular  Vitals Entered By: Raechel Ache, RN (January 02, 2010 10:39 AM) CC: 6 mo ROV.   History of Present Illness: Alexandra [name] comes in today   for follow up meds and problems. ADHD :  medication ..taking two times a day during  working.    working 5 days a week. to 6 week.  Am and then 1/2 to 1  No cp sob. No mood or sig sleep issues with this and seems to work well  Allergy asthma better  when not  in  spring. Feet   numbness   :  dx from back    was given empiiric rx for herpes /varicella  to see if changes  nymbness . Gi and has stable  Preventive Screening-Counseling & Management  Alcohol-Tobacco     Alcohol drinks/day: 0     Smoking Status: never  Safety-Violence-Falls     Seat Belt Use: yes  Allergies: No Known Drug Allergies  Past History:  Past medical, surgical, family and social histories (including risk factors) reviewed, and no changes noted (except as noted below).  Past Medical History: Headache IBS  colonoscopy Jarold Motto ADD Echo for PVCs normal Cdifficile 2007  Chickenpox  recurrent UTIs  Earlene Plater 2006 Foot pain  ? neuropathy  back radiating pain  to feet  ? fibromyalgia  Recurrent UTI hx   Family History: Reviewed history from 09/08/2008 and no changes required. Family History Diabetes 1st degree relative    No sudden death arrythmias  SD     Father:  high lipids   ? inhaler     Mother:  HBP     Siblings: well.        Social History: Reviewed history from 11/10/2009 and no changes required.  Single    [employer] college   finished school now at home . hh of 3     2 hh    pet dog and cat exposure     no alcohol    ? sweet tea a day max     goes to gym at times.  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     Seat Belt Use:  yes  Review of Systems  The patient denies chest pain, syncope, dyspnea on exertion, and peripheral edema.         able to run    had  radiculoar  signs in  feet from back     Physical Exam  General:  alert, well-developed, well-nourished, and well-hydrated.   Head:  normocephalic and atraumatic.   Eyes:  vision grossly intact, pupils equal, and pupils round.   Ears:  R ear normal and L ear normal.   Neck:  No deformities, masses, or tenderness noted. Lungs:  Normal respiratory effort, chest expands symmetrically. Lungs are clear to auscultation, no crackles or wheezes.no dullness.   Heart:  Normal rate and regular rhythm. S1 and S2 normal without gallop, murmur, click, rub or other extra sounds.no lifts.   Abdomen:  Bowel sounds positive,abdomen soft and non-tender without masses, organomegaly or   noted. Pulses:  nl cap refill  Extremities:  no clubbing cyanosis or edema  Neurologic:  nl gait  no motor deficits ..no tremor Skin:  turgor normal and color normal.   Cervical Nodes:  No lymphadenopathy noted Psych:  Oriented X3, normally interactive, good eye contact, not anxious appearing, and not depressed appearing.     Impression & Recommendations:  Problem # 1:  ATTENTION DEFICIT HYPERACTIVITY DISORDER, ADULT (ICD-314.01) stable on current med without  sig se.  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   Problem # 3:  ALLERGIC RHINITIS (ICD-477.9) with EIA   stable on current regimen Her updated medication list for this problem includes:    Flonase 50 Mcg/act Susp (Fluticasone propionate) .Marland Kitchen... 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 Take 1 capsule by mouth twice a day as needed  Problem # 5:  EXERCISE INDUCED ASTHMA (ICD-493.81) Assessment: Comment Only  Her updated medication list for this problem includes:    Ventolin Hfa 108 (90 Base) Mcg/act Aers (Albuterol sulfate) .Marland Kitchen... 1-2 puffs pre exercise   trial  or as directed    Singulair 10 Mg Tabs (Montelukast sodium)  Complete Medication List: 1)  Macrobid 100 Mg Caps (Nitrofurantoin monohyd macro) .... Take 1 capsule by mouth twice a day as needed 2)  Neurontin 600 Mg Tabs (Gabapentin) .... One by mouth three times a day per  neurology dr  Estella Husk 3)  Vicodin Es 7.5-750 Mg Tabs (Hydrocodone-acetaminophen) .... Prn 4)  Voltaren 75 Mg Tbec (Diclofenac sodium) .... As needed for back 5)  Adderall 20 Mg Tabs (Amphetamine-dextroamphetamine) .... Take 1 tablet by mouth two times a day 6)  Nuvaring 0.12-0.015 Mg/24hr Ring (Etonogestrel-ethinyl estradiol) .... As directed 7)  Ventolin Hfa 108 (90 Base) Mcg/act Aers (Albuterol sulfate) .Marland Kitchen.. 1-2 puffs pre exercise   trial  or as directed 8)  Flonase 50 Mcg/act Susp (Fluticasone propionate) .... 2 spray each nares q d  for nose allergy 9)  Singulair 10 Mg Tabs (Montelukast sodium) 10)  Adderall 20 Mg Tabs (Amphetamine-dextroamphetamine) .Marland Kitchen.. 1 by mouth two times a day fill on or after 08/04/2009 11)  Adderall 20 Mg Tabs (Amphetamine-dextroamphetamine) .Marland Kitchen.. 1 by mouth two times a day fill on or after 09/01/2009 12)  Hemorrhoidal-hc 25 Mg Supp (Hydrocortisone acetate) .... Use two times a day for up to 2 weeks  Patient Instructions: 1)  rov in 6 months or as needed .

## 2010-07-31 NOTE — Assessment & Plan Note (Signed)
Summary: headaches//ccm   Vital Signs:  Patient profile:   25 year old female Menstrual status:  regular LMP:     05/02/2010 Height:      67.75 inches Weight:      177 pounds BMI:     27.21 Pulse rate:   60 / minute BP sitting:   120 / 80  (left arm) Cuff size:   regular  Vitals Entered By: Romualdo Bolk, CMA (AAMA) (May 08, 2010 8:08 AM) CC: Miagraine Ha's. Pt states that she has 3 in 1 1/2 weeks. Pt relates this to work. Nausea and vomiting with them. Pt is also having sentivity to light and sound. LMP (date): 05/02/2010 LMP - Character: light Menarche (age onset years): [age]   Menses interval (days): 28 Menstrual flow (days): 3 Enter LMP: 05/02/2010   History of Present Illness: Alexandra [name] comes in today  for above .  problem .She is having increasing  frequency HAs .  Remote hx of  HAs.    as a teen No injury fever today  Work  intensity is up and  40 hours.  Pain  7-8  /10  rest not that helpful.  See above.    Pattern  increasing   previous  1-2 months .    Onset originally  in middle  school.  Usually on right side and then all ever head and hard to bend over.  Pounding type of HA.   ocass blurry left eye but no flashing  or aura.   NO vision loss . Other trigger ? unclear.  No weakness or nubmenss assoicated with HAs.   Sleep: good increase Caffiene:  tea in am     Not a lot of exercise. No meal skipping.  Not period related and no allergy flare.   Sees neuro  every  4-6 months for her back and poss neuropathy.    only takes narc ocassionally for back pain . 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    Asks for completion.  Preventive Screening-Counseling & Management  Alcohol-Tobacco     Alcohol drinks/day: 0     Smoking Status: never  Caffeine-Diet-Exercise     Caffeine use/day: 1     Does Patient Exercise: yes     Type of exercise: running     Exercise (avg: min/session): >60     Times/week: 7  Current Medications  (verified): 1)  Macrobid 100 Mg Caps (Nitrofurantoin Monohyd Macro) .... Take 1 Capsule By Mouth Twice A Day As Needed 2)  Neurontin 600 Mg  Tabs (Gabapentin) .... One By Mouth Three Times A Day Per  Neurology Dr  Estella Husk 3)  Vicodin Es 7.5-750 Mg  Tabs (Hydrocodone-Acetaminophen) .... Prn 4)  Voltaren 75 Mg  Tbec (Diclofenac Sodium) .... As Needed For Back 5)  Adderall 20 Mg  Tabs (Amphetamine-Dextroamphetamine) .... Take 1 Tablet By Mouth Two Times A Day 6)  Nuvaring 0.12-0.015 Mg/24hr Ring (Etonogestrel-Ethinyl Estradiol) .... As Directed 7)  Ventolin Hfa 108 (90 Base) Mcg/act Aers (Albuterol Sulfate) .Marland Kitchen.. 1-2 Puffs Pre Exercise   Trial  or As Directed 8)  Flonase 50 Mcg/act Susp (Fluticasone Propionate) .... 2 Spray Each Nares Q D  For Nose Allergy 9)  Singulair 10 Mg Tabs (Montelukast Sodium) 10)  Adderall 30 Mg Tabs (Amphetamine-Dextroamphetamine) .Marland Kitchen.. 1 By Mouth Qam and 1 1/2 in Pm 11)  Adderall 20 Mg Tabs (Amphetamine-Dextroamphetamine) .Marland Kitchen.. 1 By Mouth Two Times A Day Fill On or After  05/26/10 12)  Hemorrhoidal-Hc 25 Mg Supp (Hydrocortisone Acetate) .... Use Two Times A Day For Up To 2 Weeks  Allergies (verified): No Known Drug Allergies  Past History:  Past medical, surgical, family and social histories (including risk factors) reviewed, and no changes noted (except as noted below).  Past Medical History: Reviewed history from 01/02/2010 and no changes required. Headache IBS  colonoscopy Jarold Motto ADD Echo for PVCs normal Cdifficile 2007  Chickenpox  recurrent UTIs  Earlene Plater 2006 Foot pain  ? neuropathy  back radiating pain  to feet  ? fibromyalgia  Recurrent UTI hx   Past History:  Care Management: Neurology: [facility]. Gynecology:[facility].  Family History: Reviewed history from 09/08/2008 and no changes required. Family History Diabetes 1st degree relative    No sudden death arrythmias  SD     Father:  high lipids   ? inhaler     Mother:  HBP      Siblings: well.        Social History: Reviewed history from 11/10/2009 and no changes required.  Single    [employer] college   finished school now at home . hh of 3     2 hh    pet dog and cat exposure     no alcohol    ? sweet tea a day max     goes to gym at times.  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        Review of Systems  The patient denies anorexia, fever, weight loss, weight gain, vision loss, decreased hearing, hoarseness, chest pain, syncope, peripheral edema, prolonged cough, hemoptysis, melena, hematochezia, severe indigestion/heartburn, hematuria, muscle weakness, transient blindness, difficulty walking, abnormal bleeding, enlarged lymph nodes, and angioedema.    Physical Exam  General:  alert, well-developed, well-nourished, and well-hydrated.   Head:  normocephalic, atraumatic, and no abnormalities observed.   Eyes:  PERRL, EOMs full, conjunctiva clear  Ears:  R ear normal, L ear normal, and no external deformities.   Nose:  no external deformity and no nasal discharge.   Mouth:  pharynx pink and moist.   Neck:  No deformities, masses, or tenderness noted. Lungs:  Normal respiratory effort, chest expands symmetrically. Lungs are clear to auscultation, no crackles or wheezes.no dullness.   Heart:  Normal rate and regular rhythm. S1 and S2 normal without gallop, murmur, click, rub or other extra sounds.no lifts.   Abdomen:  Bowel sounds positive,abdomen soft and non-tender without masses, organomegaly or   noted. Msk:  no joint warmth and no redness over joints.   Pulses:  pulses intact without delay   Extremities:  no clubbing cyanosis or edema  Neurologic:  alert & oriented X3, strength normal in all extremities, and gait normal.   Skin:  turgor normal, color normal, no ecchymoses, and no petechiae.   Cervical Nodes:  No lymphadenopathy noted Psych:  Oriented X3, normally interactive, good eye contact, not anxious appearing, and not depressed appearing.      Impression & Recommendations:  Problem # 1:  HEADACHE (ICD-784.0) this seems like migraines with no other alarm symptoms increasing felt secondary to intensity and stress of work. Discussed tracking and lifestyle intervention and avoidance of rebound medications. We try tryptophan.   input  When she follows up with her neurologist. and follow-up here is discussed.   HA  calendar given. Her updated medication list for this problem includes:    Vicodin Es 7.5-750 Mg Tabs (Hydrocodone-acetaminophen) .Marland Kitchen... Prn    Voltaren 75  Mg Tbec (Diclofenac sodium) .Marland Kitchen... As needed for back  not taking  currently  05/2010    Sumatriptan Succinate 100 Mg Tabs (Sumatriptan succinate) .Marland Kitchen... 1 by mouth  at onset  and can repeat in 2-4 hours  Problem # 2:  SCREENING, PULMONARY TUBERCULOSIS (ICD-V74.1) low risk  Problem # 3:  FIBROMYALGIA (ICD-729.1) stable  Her updated medication list for this problem includes:    Vicodin Es 7.5-750 Mg Tabs (Hydrocodone-acetaminophen) .Marland Kitchen... Prn    Voltaren 75 Mg Tbec (Diclofenac sodium) .Marland Kitchen... As needed for back  not taking  currently  05/2010  Problem # 4:  BACK PAIN, LUMBOSACRAL, CHRONIC (ICD-724.5) followed by neurology. Previous evaluation. No limitations except heavy lifting Her updated medication list for this problem includes:    Vicodin Es 7.5-750 Mg Tabs (Hydrocodone-acetaminophen) .Marland Kitchen... Prn    Voltaren 75 Mg Tbec (Diclofenac sodium) .Marland Kitchen... As needed for back  not taking  currently  05/2010  Complete Medication List: 1)  Macrobid 100 Mg Caps (Nitrofurantoin monohyd macro) .... Take 1 capsule by mouth twice a day as needed 2)  Neurontin 600 Mg Tabs (Gabapentin) .... One by mouth three times a day per  neurology dr  Estella Husk  for back pain. 3)  Vicodin Es 7.5-750 Mg Tabs (Hydrocodone-acetaminophen) .... Prn 4)  Voltaren 75 Mg Tbec (Diclofenac sodium) .... As needed for back  not taking  currently  05/2010 5)  Adderall 20 Mg Tabs (Amphetamine-dextroamphetamine) ....  Take 1 tablet by mouth two times a day 6)  Nuvaring 0.12-0.015 Mg/24hr Ring (Etonogestrel-ethinyl estradiol) .... As directed 7)  Ventolin Hfa 108 (90 Base) Mcg/act Aers (Albuterol sulfate) .Marland Kitchen.. 1-2 puffs pre exercise   trial  or as directed 8)  Flonase 50 Mcg/act Susp (Fluticasone propionate) .... 2 spray each nares q d  for nose allergy 9)  Singulair 10 Mg Tabs (Montelukast sodium) 10)  Adderall 30 Mg Tabs (Amphetamine-dextroamphetamine) .Marland Kitchen.. 1 by mouth qam and 1 1/2 in pm 11)  Adderall 20 Mg Tabs (Amphetamine-dextroamphetamine) .Marland Kitchen.. 1 by mouth two times a day fill on or after 05/26/10 12)  Hemorrhoidal-hc 25 Mg Supp (Hydrocortisone acetate) .... Use two times a day for up to 2 weeks 13)  Sumatriptan Succinate 100 Mg Tabs (Sumatriptan succinate) .Marland Kitchen.. 1 by mouth  at onset  and can repeat in 2-4 hours  Patient Instructions: 1)  track HAs  as discussed  to minimize triggers  2)  Do not skip meals . 3)   minimize caffiene 4)  Take 2 aleve   at onset   and track  can add  Imitrex  also   5)  Then consider follow up here or with neurology  about further management  6)  call in meantime if worse.  Prescriptions: SUMATRIPTAN SUCCINATE 100 MG TABS (SUMATRIPTAN SUCCINATE) 1 by mouth  at onset  and can repeat in 2-4 hours  #9 x 1   Entered and Authorized by:   Madelin Headings MD   Signed by:   Madelin Headings MD on 05/08/2010   Method used:   [facility] [address] [id number][facility]ail[facility]       [address]       [place], [place]  [address]       Ph: [phone] or [phone]       Fax: [phone]   RxID:   [id number]    Orders Added: 1)  Est. Patient  Level IV [[address]]   Immunizations Administered:  PPD Skin Test:    Vaccine Type: PPD    Site: left forearm    Mfr: Sanofi Pasteur    Dose: 0.1 ml    Route: ID    Given by: Romualdo Bolk, CMA (AAMA)    Exp. Date: 05/03/2011    Lot #: [id number]   Immunizations Administered:  PPD  Skin Test:    Vaccine Type: PPD    Site: left forearm    Mfr: Sanofi Pasteur    Dose: 0.1 ml    Route: ID    Given by: Romualdo Bolk, CMA (AAMA)    Exp. Date: 05/03/2011    Lot #: [id number]

## 2010-07-31 NOTE — Progress Notes (Signed)
Summary: Pts neurologist is out until [facility] 05/21/10. What should she do?  Phone Note Call from Patient Call back at [phone]    Caller: Patient Summary of Call: Pt called and was told to go to her Neurologist,but her neurologist is out until monday 05/21/10. Pls advise pt what she should do re: kindney pain?  Initial call taken by: Lucy Antigua,  May 17, 2010 1:33 PM  Follow-up for Phone Call        Pt is coming in today to be seen. Follow-up by: Romualdo Bolk, CMA Duncan Dull),  May 17, 2010 2:24 PM     Appended Document: Pts neurologist is out until [facility] 05/21/10. What should she do? Pt was on the schedule for this am but last night we called her and found out that she was having a burning pain in her back. We told her that she needed to go the neurologist instead. So we cancelled pt's appt for today.

## 2010-07-31 NOTE — Assessment & Plan Note (Signed)
Summary: PERSONAL // RS   Vital Signs:  Patient profile:   25 year old female Menstrual status:  regular LMP:     10/25/2009 Weight:      174 pounds Temp:     98.1 degrees F oral Pulse rate:   66 / minute BP sitting:   100 / 60  (right arm) Cuff size:   regular  Vitals Entered By: Romualdo Bolk, CMA Duncan Dull) (Nov 10, 2009 3:42 PM) CC: Hemmorroid- rectal pain and some burning and itching, This has been going on for 2 weeks now. LMP (date): 10/25/2009 LMP - Character: light Menarche (age onset years): [age]   Menses interval (days): 28 Menstrual flow (days): 3 Enter LMP: 10/25/2009   History of Present Illness: Alexandra [name] comesin comes in today  for   SDA  for above problem today.   See above .More pain as  an issue.   Going on for a few weeks  and getting worse   bending over and now worse.    NO meds. No blood in stool    .    Had a strain with a firm BM  and then this started.  BMs usually  4-5 x per week.   has had a prob with c onstipation at times . No blood. itch and pain.  Remote hx of similar problem but m  Preventive Screening-Counseling & Management  Alcohol-Tobacco     Alcohol drinks/day: 0     Smoking Status: never  Caffeine-Diet-Exercise     Caffeine use/day: 1     Does Patient Exercise: yes     Type of exercise: running     Exercise (avg: min/session): >60     Times/week: 7  Current Medications (verified): 1)  Macrobid 100 Mg Caps (Nitrofurantoin Monohyd Macro) .... Take 1 Capsule By Mouth Twice A Day As Needed 2)  Neurontin 600 Mg  Tabs (Gabapentin) .... One By Mouth Three Times A Day Per  Neurology Dr  Estella Husk 3)  Vicodin Es 7.5-750 Mg  Tabs (Hydrocodone-Acetaminophen) .... Prn 4)  Voltaren 75 Mg  Tbec (Diclofenac Sodium) .... As Needed For Back 5)  Adderall 20 Mg  Tabs (Amphetamine-Dextroamphetamine) .... Take 1 Tablet By Mouth Two Times A Day 6)  Nuvaring 0.12-0.015 Mg/24hr Ring (Etonogestrel-Ethinyl Estradiol) .... As Directed 7)  Ventolin  Hfa 108 (90 Base) Mcg/act Aers (Albuterol Sulfate) .Marland Kitchen.. 1-2 Puffs Pre Exercise   Trial  or As Directed 8)  Flonase 50 Mcg/act Susp (Fluticasone Propionate) .... 2 Spray Each Nares Q D  For Nose Allergy 9)  Singulair 10 Mg Tabs (Montelukast Sodium) 10)  Adderall 20 Mg Tabs (Amphetamine-Dextroamphetamine) .Marland Kitchen.. 1 By Mouth Two Times A Day Fill On or After 08/04/2009 11)  Adderall 20 Mg Tabs (Amphetamine-Dextroamphetamine) .Marland Kitchen.. 1 By Mouth Two Times A Day Fill On or After 09/01/2009  Allergies (verified): No Known Drug Allergies  Past History:  Past medical, surgical, family and social histories (including risk factors) reviewed, and no changes noted (except as noted below).  Past Medical History: Reviewed history from 07/04/2009 and no changes required. Headache IBS  colonoscopy Jarold Motto ADD Echo for PVCs normal Cdifficile 2007  Chickenpox  recurrent UTIs  Earlene Plater 2006 Foot pain  ? neuropathy ? fibromyalgia  Recurrent UTI hx   Past History:  Care Management: Neurology: [facility]. Gynecology:[facility].  Family History: Reviewed history from 09/08/2008 and no changes required. Family History Diabetes 1st degree relative    No sudden death arrythmias  SD  Father:  high lipids   ? inhaler     Mother:  HBP     Siblings: well.        Social History: Reviewed history from 07/04/2009 and no changes required.  Single    [employer] college   finished school now at home . hh of 3     2 hh    pet dog and cat exposure     no alcohol    ? sweet tea a day max     goes to gym at times.  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        Review of Systems  The patient denies anorexia, fever, weight loss, weight gain, abdominal pain, melena, hematochezia, abnormal bleeding, and enlarged lymph nodes.    Physical Exam  General:  Well-developed,well-nourished,in no acute distress; alert,appropriate and cooperative throughout examination Abdomen:  Bowel sounds positive,abdomen  soft and non-tender without masses, organomegaly or hernias noted. Rectal:  no masses, no fistulae, and no perianal rash.  slight hemorrhoidal  swelling and redness no blood and hemorrhoid felt onrectal  no fissure seen  and no blood Pulses:  nl cap refill  Neurologic:  ontact  Skin:  turgor normal, color normal, and no ecchymoses.   Cervical Nodes:  No lymphadenopathy noted Psych:  Oriented X3, good eye contact, not anxious appearing, and not depressed appearing.     Impression & Recommendations:  Problem # 1:  ANAL FISSURE (ICD-565.0) vs hemorrhoid.     hx of constipation  no change in bowel habits .     stool softener and empiric rx.   follow up if persistent or  progressive    Complete Medication List: 1)  Macrobid 100 Mg Caps (Nitrofurantoin monohyd macro) .... Take 1 capsule by mouth twice a day as needed 2)  Neurontin 600 Mg Tabs (Gabapentin) .... One by mouth three times a day per  neurology dr  Estella Husk 3)  Vicodin Es 7.5-750 Mg Tabs (Hydrocodone-acetaminophen) .... Prn 4)  Voltaren 75 Mg Tbec (Diclofenac sodium) .... As needed for back 5)  Adderall 20 Mg Tabs (Amphetamine-dextroamphetamine) .... Take 1 tablet by mouth two times a day 6)  Nuvaring 0.12-0.015 Mg/24hr Ring (Etonogestrel-ethinyl estradiol) .... As directed 7)  Ventolin Hfa 108 (90 Base) Mcg/act Aers (Albuterol sulfate) .Marland Kitchen.. 1-2 puffs pre exercise   trial  or as directed 8)  Flonase 50 Mcg/act Susp (Fluticasone propionate) .... 2 spray each nares q d  for nose allergy 9)  Singulair 10 Mg Tabs (Montelukast sodium) 10)  Adderall 20 Mg Tabs (Amphetamine-dextroamphetamine) .Marland Kitchen.. 1 by mouth two times a day fill on or after 08/04/2009 11)  Adderall 20 Mg Tabs (Amphetamine-dextroamphetamine) .Marland Kitchen.. 1 by mouth two times a day fill on or after 09/01/2009 12)  Hemorrhoidal-hc 25 Mg Supp (Hydrocortisone acetate) .... Use two times a day for up to 2 weeks  Patient Instructions: 1)  med as discussed    2)  stool softener to try in  addition   3)  call if not improving by end of meds  Prescriptions: HEMORRHOIDAL-HC 25 MG SUPP (HYDROCORTISONE ACETATE) use two times a day for up to 2 weeks  #28 x 1   Entered and Authorized by:   Madelin Headings MD   Signed by:   Madelin Headings MD on 11/10/2009   Method used:   [facility]        [facility] [address] [id number][facility] ([facility]ail)       [address]       [place]       [place], [place]  [address]       Ph: [phone] or [phone]       Fax: [phone]   RxID:   [id number]

## 2010-07-31 NOTE — Assessment & Plan Note (Signed)
Summary: tb reading//ccm   Nurse Visit   Allergies: No Known Drug Allergies  PPD Results    Date of reading: 05/10/2010    Results: < 5mm    Interpretation: negative  Orders Added: 1)  TB Skin Test [[address]] form completed wkp.

## 2010-07-31 NOTE — Progress Notes (Signed)
Summary: new rx  Phone Note Refill Request Message from:  Patient on [phone]  Refills Requested: Medication #1:  ADDERALL 20 MG  TABS Take 1 tablet by mouth two times a day 3 month supply  Initial call taken by: Heron Sabins,  March 26, 2010 1:09 PM  Follow-up for Phone Call        Left message on machine that rx is ready to pick up. Follow-up by: Romualdo Bolk, CMA (AAMA),  March 26, 2010 3:42 PM    New/Updated Medications: ADDERALL 20 MG  TABS (AMPHETAMINE-DEXTROAMPHETAMINE) Take 1 tablet by mouth two times a day ADDERALL 20 MG TABS (AMPHETAMINE-DEXTROAMPHETAMINE) 1 by mouth two times a day FILL ON OR AFTER 04/25/10 ADDERALL 20 MG TABS (AMPHETAMINE-DEXTROAMPHETAMINE) 1 by mouth two times a day FILL ON OR AFTER 05/26/10 Prescriptions: ADDERALL 20 MG TABS (AMPHETAMINE-DEXTROAMPHETAMINE) 1 by mouth two times a day FILL ON OR AFTER 05/26/10  #60 x 0   Entered by:   Romualdo Bolk, CMA (AAMA)   Authorized by:   Madelin Headings MD   Signed by:   Romualdo Bolk, CMA (AAMA) on 03/26/2010   Method used:   Print then Give to Patient   RxID:   [id number] ADDERALL 20 MG TABS (AMPHETAMINE-DEXTROAMPHETAMINE) 1 by mouth two times a day FILL ON OR AFTER 04/25/10  #60 x 0   Entered by:   Romualdo Bolk, CMA (AAMA)   Authorized by:   Madelin Headings MD   Signed by:   Romualdo Bolk, CMA (AAMA) on 03/26/2010   Method used:   Print then Give to Patient   RxID:   [id number] ADDERALL 20 MG  TABS (AMPHETAMINE-DEXTROAMPHETAMINE) Take 1 tablet by mouth two times a day  #60 x 0   Entered by:   Romualdo Bolk, CMA (AAMA)   Authorized by:   Madelin Headings MD   Signed by:   Romualdo Bolk, CMA (AAMA) on 03/26/2010   Method used:   Print then Give to Patient   RxID:   [id number]

## 2010-07-31 NOTE — Progress Notes (Signed)
Summary: new rx  Phone Note Call from Patient Call back at Home Phone [phone] Call back at [phone]   Caller: Patient Call For: Madelin Headings MD Summary of Call: pt needs new rx adderall 30 mg due to only strength avaiable at pharm Initial call taken by: Heron Sabins,  April 03, 2010 3:38 PM  Follow-up for Phone Call        Doe thay have 15 mg  and do 1and 1/2 two times a day  or take 4 10 mg two times a day  but i would not rec to go up to 60 mg per day  Follow-up by: Madelin Headings MD,  April 04, 2010 9:17 AM  Additional Follow-up for Phone Call Additional follow up Details #1::        LMTOCB to let us know what she found out with the pharmacy and we can write a rx accordingly. Additional Follow-up by: Romualdo Bolk, CMA (AAMA),  April 04, 2010 9:23 AM    Additional Follow-up for Phone Call Additional follow up Details #2::    only mg is available is plain 30mg . please return her call. Follow-up by: Warnell Forester,  April 04, 2010 10:39 AM  Additional Follow-up for Phone Call Additional follow up Details #3:: Details for Additional Follow-up Action Taken: can try  1 in am and 1.2 in pm  disp   45 of the 30 mg tabs.  adderall 30 mg .  Additional Follow-up by: Madelin Headings MD,  April 04, 2010 5:57 PM  New/Updated Medications: ADDERALL 30 MG TABS (AMPHETAMINE-DEXTROAMPHETAMINE) 1 by mouth qam and 1 1/2 in pm Prescriptions: ADDERALL 30 MG TABS (AMPHETAMINE-DEXTROAMPHETAMINE) 1 by mouth qam and 1 1/2 in pm  #45 x 0   Entered by:   Romualdo Bolk, CMA (AAMA)   Authorized by:   Madelin Headings MD   Signed by:   Romualdo Bolk, CMA (AAMA) on 04/10/2010   Method used:   Print then Give to Patient   RxID:   [id number]  Left message for pt that rx is ready to pick up. Romualdo Bolk, CMA Duncan Dull)  April 10, 2010 2:32 PM

## 2010-08-02 NOTE — Letter (Signed)
Summary: [facility]   Imported By: Maryln Gottron 06/27/2010 12:27:22  _____________________________________________________________________  External Attachment:    Type:   Image     Comment:   External Document

## 2010-08-03 ENCOUNTER — Telehealth: Payer: Self-pay | Admitting: *Deleted

## 2010-08-03 ENCOUNTER — Other Ambulatory Visit: Payer: Self-pay | Admitting: Internal Medicine

## 2010-08-03 NOTE — Telephone Encounter (Signed)
Error

## 2010-08-30 ENCOUNTER — Telehealth: Payer: Self-pay | Admitting: Internal Medicine

## 2010-08-30 NOTE — Telephone Encounter (Signed)
Pt req script for Adderall 20mg  #60. Pls call when ready for pick up.

## 2010-08-31 MED ORDER — AMPHETAMINE-DEXTROAMPHETAMINE 20 MG PO TABS: 20.0000 mg | ORAL_TABLET | Freq: Two times a day (BID) | ORAL | Status: DC

## 2010-08-31 NOTE — Telephone Encounter (Signed)
Left message on machine that rx will be ready to pick up on Monday and that she is due for a follow up appt before her next refill.

## 2010-09-07 ENCOUNTER — Telehealth: Payer: Self-pay | Admitting: *Deleted

## 2010-09-07 ENCOUNTER — Encounter: Payer: Self-pay | Admitting: Internal Medicine

## 2010-09-07 ENCOUNTER — Ambulatory Visit (INDEPENDENT_AMBULATORY_CARE_PROVIDER_SITE_OTHER): Payer: BC Managed Care – PPO | Admitting: Internal Medicine

## 2010-09-07 VITALS — BP 130/80 | HR 78 | Temp 97.4°F | Resp 12

## 2010-09-07 DIAGNOSIS — K5289 Other specified noninfective gastroenteritis and colitis: Secondary | ICD-10-CM

## 2010-09-07 DIAGNOSIS — K589 Irritable bowel syndrome without diarrhea: Secondary | ICD-10-CM

## 2010-09-07 DIAGNOSIS — F909 Attention-deficit hyperactivity disorder, unspecified type: Secondary | ICD-10-CM

## 2010-09-07 DIAGNOSIS — R112 Nausea with vomiting, unspecified: Secondary | ICD-10-CM

## 2010-09-07 DIAGNOSIS — K529 Noninfective gastroenteritis and colitis, unspecified: Secondary | ICD-10-CM

## 2010-09-07 MED ORDER — ONDANSETRON 4 MG PO TBDP: 4.0000 mg | ORAL_TABLET | Freq: Three times a day (TID) | ORAL | Status: AC | PRN

## 2010-09-07 MED ORDER — PROMETHAZINE HCL 25 MG/ML IJ SOLN: 25.0000 mg | Freq: Once | INTRAMUSCULAR | Status: DC

## 2010-09-07 MED ORDER — PROMETHAZINE HCL 25 MG/ML IJ SOLN
50.0000 mg | Freq: Once | INTRAMUSCULAR | Status: AC
  Administered 2010-09-07: 25 mg via INTRAMUSCULAR

## 2010-09-07 NOTE — Progress Notes (Signed)
  Subjective:    Patient ID: Alexandra [name], female    DOB: December 01, 1985, 25 y.o.   MRN: [id number]  HPI  patient comes in as a walk-in for acute onset of vomiting at 12:30 AM this morning. She has vomited a most every hour and then more frequently up until now. She then began to have diarrhea like "being out of my butt" was no blood fever or rashes.   No severe abdominal pain just cramps nausea and discomfort. She has not tried to keep anything down and feels thirsty. Her last meal was fried rice chicken from by mouth to PF changs last pm .  Hasn't eaten anything since then. She is now having equivalent of dry heaves and sometimes green substance.   No history of travel exposures only new medicines was propranolol for her headaches and  Trazodone given to her for her knee problem fibromyalgia but she hasn't taken not.   Her last period was about 2 weeks ago and she is on Nuvaring  Past Medical History  Diagnosis Date  . Allergy   . Anal fissure   . ADHD (attention deficit hyperactivity disorder)   . Low back pain   . Atypical chest pain   . Anemia   . Myalgia and myositis, unspecified   . Headache   . Mononeuritis of lower limb, unspecified   . Screening examination for pulmonary tuberculosis   . Urinary tract infection, site not specified   . IBS (irritable bowel syndrome)    No past surgical history on file.  reports that she has never smoked. She does not have any smokeless tobacco history on file. She reports that she does not drink alcohol. Her drug history not on file. family history includes Asthma in her father; Hyperlipidemia in her father; and Hypertension in her mother. No Known Allergies   Review of Systems  negative for fever change in vision and decreased hearing cough rash GU problem. Rest of her problems such as peripheral neuropathy ADD exercise-induced asthma and fibromyalgia are the same.    Objective:   Physical Exam  well-developed well-nourished mildly ill  nontoxic in no acute distress. Cognition is normal. HEENT: Normocephalic ;atraumatic , Eyes;  PERRL, EOMs  Full, lids and conjunctiva clear,,Ears: no deformities, canals nl, TM landmarks normal, Nose: no deformity or discharge  Mouth : OP clear without lesion or edema . Mucous membranes are moist Neck supple and no masses or adenopathy Chest:  Clear to A&P without wheezes rales or rhonchi CV:  S1-S2 no gallops or murmurs peripheral perfusion is normal Abdomen:  Sof,t normal bowel sounds without hepatosplenomegaly, no guarding rebound or masses no CVA tenderness  Midly tender or sore but soft . No clubbing cyanosis or edema  Skin no acute rashes turgor is normal  Neuro cognition and affect nl speech normal     Assessment & Plan:   Acute vomiting and diarrhea of presumed infectious acute gastroenteritis with mild hydration issue. Currently she is stable cardiovascular-wise.  She is a remote history of C. Difficile but I do not think that that is the problem today.   Discussed treatment her symptoms hydration and close observation over the weekend no work for the next 2-3 days or until significantly better. Expectant management.  Father came to pick her up    Instruction s given  No work for 2-3 days until better

## 2010-09-07 NOTE — Telephone Encounter (Signed)
[facility] Triage Call Report Triage Record Num: [id number] Operator: Edgar Frisk Patient Name: Alexandra [name] Call Date & Time: 09/07/2010 5:13:01AM Patient Phone: [phone] PCP: Neta Mends. Panosh Patient Gender: Female PCP Fax : [phone] Patient DOB: [birth year] [facility] Name[facility] Reason for Call: Pt reports woke at 12mn vomiting , vomiting approx q 30 min since onset , now has dry heaves, no fever, some nausea, no pain or cramping, no other symptms. care advice per guideline SS for call back reviewed Protocol(s) Used: Nausea or Vomiting Recommended Outcome per Protocol: Provide Home/Self Care Reason for Outcome: All other situations Care Advice: Call provider if symptoms continue for 24 hours, blood in vomit, severe abdominal pain, fever over 101.5 F (38.6 C), rapid breathing or pulse, or severe vomiting with diarrhea. ~ Nausea Care Advice: - Drink small amounts of clear, sweetened liquids or ice cold drinks. - Eat light, bland foods such as saltine crackers or plain bread. - Do not eat high fat, highly seasoned, high fiber, or high sugar content foods. - Avoid mixing hot food and cold foods. - Eat smaller, more frequent meals. - Rest as much as possible in a sitting or in a propped lying position. Do not lie flat for at least 2 hours after eating. - Do not take pain medication (such as aspirin, NSAIDs) while nauseated. - Rest as much as possible until symptoms improve since activity may worsen nausea. ~ Go to the ED if you have developed signs and symptoms of dehydration such as very dry mouth and tongue; increased pulse rate at rest; no urine output for 8 hours or more; increasing weakness or drowsiness, or lightheadedness when trying to sit upright or standing. ~ Vomiting Care Advice: - Do not eat solid foods until vomiting subsides. - Begin taking fluids by sucking on ice chips or popsicles or taking sips of cool clear, nonprescription oral  rehydration solution). - Gradually drink larger amounts of these fluids so that you are drinking six to eight 8 oz. (.2 liter) of fluids a day. - Keep activity to a minimum. - After vomiting subsides, eat smaller, more frequent meals of easily digested foods such as crackers, toast, bananas, rice, cooked cereal, applesauce, broth, baked or mashed potatoes, chicken or [place] without skin. Eat slowly. - Take fluids 30 minutes before or 60 minutes after meals. - Avoid high fat, highly seasoned, high fiber or high sugar content foods. - Avoid extremely hot or cold foods. - Do not take pain medication (such as aspirin, NSAIDs) while nauseated or vomiting. - Consult your provider for advice regarding continuing prescription medication. - Rest as much as possible in a sitting or in a propped lying position. Do not lie flat for at least 2 hours after eating. ~ 09/07/2010 5:19:26AM Page 1 of 1 [id number]

## 2010-09-07 NOTE — Patient Instructions (Signed)
Small sips of water of hydration fluid frequently . After the next 3-4 hours .  ie 1 oz evey 15-20 minutes  Rest and fluids for hydration. Expect vomiting to bet better in the next 4-6 hours  . diarrhea may last days ... Gastroenteritis (Vomiting, Diarrhea, and/or Dehydration) Viral gastroenteritis is also known as stomach flu. This condition affects the stomach and intestinal tract. The illness typically lasts 3 to 8 days. Most people develop an immune response. This eventually gets rid of the virus. While this natural response develops, the virus can make you quite ill. The majority of those affected are young infants. CAUSES Diarrhea and vomiting are often caused by a virus. Medicines (antibiotics) that kill germs will not help unless there is also a germ (bacterial) infection. SYMPTOMS The most common symptom is diarrhea. This can cause severe loss of fluids (dehydration) and body salt (electrolyte) imbalance. TREATMENT Treatments for this illness are aimed at rehydration. Antidiarrheal medicines are not recommended. They do not decrease diarrhea volume and may be harmful. Usually, home treatment is all that is needed. The most serious cases involve vomiting so severely that you are not able to keep down fluids taken by mouth (orally). In these cases, intravenous (IV) fluids are needed. Vomiting with viral gastroenteritis is common, but it will usually go away with treatment. HOME CARE INSTRUCTIONS Small amounts of fluids should be taken frequently. Large amounts at one time may not be tolerated. Plain water may be harmful in infants and the elderly. Oral rehydration solutions (ORS) are available at pharmacies and grocery stores. ORS replace water and important electrolytes in proper proportions. Sports drinks are not as effective as ORS and may be harmful due to sugars worsening diarrhea.  As a general guideline for children, replace any new fluid losses from diarrhea and/or vomiting with ORS as  follows:   If your child weighs 22 pounds or under (10 kg or less), give 60-120 mL (1/4 - 1/2 cup or 2 - 4 ounces) of ORS for each diarrheal stool or vomiting episode.   If your child weighs more than 22 pounds (more than 10 kgs), give 120-240 mL (1/2 - 1 cup or 4 - 8 ounces) of ORS for each diarrheal stool or vomiting episode.   In a child with vomiting, it may be helpful to give the above ORS replacement in 5 mL (1 teaspoon) amounts every 5 minutes, then increase as tolerated.   While correcting for dehydration, children should eat normally. However, foods high in sugar should be avoided because this may worsen diarrhea. Large amounts of carbonated soft drinks, juice, gelatin desserts, and other highly sugared drinks should be avoided.   After correction of dehydration, other liquids that are appealing to the child may be added. Children should drink small amounts of fluids frequently and fluids should be increased as tolerated.   Adults should eat normally while drinking more fluids than usual. Drink small amounts of fluids frequently and increase as tolerated. Drink enough water and fluids to keep your urine clear or pale yellow. Broths, weak decaffeinated tea, lemon-lime soft drinks (allowed to go flat), and ORS replace fluids and electrolytes.  Avoid:  Carbonated drinks.  Juice.   Extremely hot or cold fluids.   Caffeine drinks.   Fatty, greasy foods.   Alcohol.  Tobacco.   Too much intake of anything at one time.   Gelatin desserts.    Probiotics are active cultures of beneficial bacteria. They may lessen the amount and number of  diarrheal stools in adults. Probiotics can be found in yogurt with active cultures and in supplements.   Wash your hands well to avoid spreading bacteria and viruses.   Antidiarrheal medicines are not recommended for infants and children.   Only take over-the-counter or prescription medicines for pain, discomfort, or fever as directed by your  caregiver. Do not give aspirin to children.   For adults with dehydration, ask your caregiver if you should continue all prescribed and over-the-counter medicines.   If your caregiver has given you a follow-up appointment, it is very important to keep that appointment. Not keeping the appointment could result in a lasting (chronic) or permanent injury and disability. If there is any problem keeping the appointment, you must call to reschedule.  SEEK IMMEDIATE MEDICAL CARE IF:  You are unable to keep fluids down.   There is no urine output in 6 to 8 hours or there is only a small amount of very dark urine.   You develop shortness of breath.   There is blood in the vomit (may look like coffee grounds) or stool.   Belly (abdominal) pain develops, increases, or localizes.   There is persistent vomiting or diarrhea.   You or your child has an oral temperature above 101, not controlled by medicine.   Your baby is older than 3 months with a rectal temperature of 102 F (38.9 C) or higher.   Your baby is 79 months old or younger with a rectal temperature of 100.4 F (38 C) or higher.  MAKE SURE YOU:  Understand these instructions.   Will watch your condition.   Will get help right away if you are not doing well or get worse.  Document Released: 06/17/2005 Document Re-Released: 12/05/2009 [facility] Patient Information 2011 [place], [place].

## 2010-09-17 LAB — URINE CULTURE
Colony Count: NO GROWTH
Culture: NO GROWTH

## 2010-09-17 LAB — URINALYSIS, ROUTINE W REFLEX MICROSCOPIC
Bilirubin Urine: NEGATIVE
Glucose, UA: NEGATIVE mg/dL
Hgb urine dipstick: NEGATIVE
Ketones, ur: NEGATIVE mg/dL
Nitrite: NEGATIVE
Protein, ur: NEGATIVE mg/dL
Specific Gravity, Urine: 1.019 (ref 1.005–1.030)
Urobilinogen, UA: 0.2 mg/dL (ref 0.0–1.0)
pH: 7 (ref 5.0–8.0)

## 2010-09-17 LAB — URINE MICROSCOPIC-ADD ON

## 2010-09-17 LAB — POCT PREGNANCY, URINE: Preg Test, Ur: NEGATIVE

## 2010-09-25 ENCOUNTER — Ambulatory Visit: Payer: BC Managed Care – PPO | Admitting: Internal Medicine

## 2010-10-03 ENCOUNTER — Encounter: Payer: Self-pay | Admitting: Internal Medicine

## 2010-10-03 ENCOUNTER — Ambulatory Visit (INDEPENDENT_AMBULATORY_CARE_PROVIDER_SITE_OTHER): Payer: BC Managed Care – PPO | Admitting: Internal Medicine

## 2010-10-03 VITALS — BP 120/70 | HR 66 | Wt 168.0 lb

## 2010-10-03 DIAGNOSIS — M79609 Pain in unspecified limb: Secondary | ICD-10-CM

## 2010-10-03 DIAGNOSIS — F909 Attention-deficit hyperactivity disorder, unspecified type: Secondary | ICD-10-CM

## 2010-10-03 DIAGNOSIS — M79605 Pain in left leg: Secondary | ICD-10-CM

## 2010-10-03 DIAGNOSIS — R51 Headache: Secondary | ICD-10-CM

## 2010-10-03 DIAGNOSIS — J309 Allergic rhinitis, unspecified: Secondary | ICD-10-CM

## 2010-10-03 DIAGNOSIS — J45990 Exercise induced bronchospasm: Secondary | ICD-10-CM

## 2010-10-03 MED ORDER — AMPHETAMINE-DEXTROAMPHETAMINE 20 MG PO TABS: ORAL_TABLET | ORAL | Status: DC

## 2010-10-03 MED ORDER — AMPHETAMINE-DEXTROAMPHETAMINE 20 MG PO TABS: 20.0000 mg | ORAL_TABLET | Freq: Two times a day (BID) | ORAL | Status: DC

## 2010-10-03 NOTE — Progress Notes (Signed)
  Subjective:    Patient ID: Alexandra [name], female    DOB: 12-15-1985, 25 y.o.   MRN: [id number]  HPI Patient comes in today for followup of her ADD medications. Taking meds most days esp on work days.   Since her last visit she is significantly improved from her GI episode. Her neurologist is placed her on long-acting Inderal for her headaches which has helped significantly. She denies significant side effects. Her blood pressure has been good her sleep has been stable. Her peripheral neuropathy is about the same. Allergy stable  Takes inhaler prn high pollen days   Review of Systems She's had some pain on her left lateral thigh that may be nerve pain not worse with exercise doesn't cause her to limp and there was no injury   it does not limit her activity.  No hip or groin pain. No limitation of exercise     Objective:   Physical Exam Well-developed well-nourished in no acute distress HEENT: Normocephalic ;atraumatic , Eyes;  PERRL, EOMs  Full, lids and conjunctiva clear,,Ears: no deformities, canals nl, TM landmarks normal, Nose: no deformity or discharge  Mouth : OP clear without lesion or edema . Neck no masses or thyromegaly Chest:  Clear to A&P without wheezes rales or rhonchi CV:  S1-S2 no gallops or murmurs peripheral perfusion is normal Abdomen:  Sof,t normal bowel sounds without hepatosplenomegaly, no guarding rebound or masses no CVA tenderness Neuro  non focal Left leg no mass No clubbing cyanosis or edema no atrophy.   Nl gait.    Assessment & Plan:  ADHD  Stable continue on meds    . No untoward side effects.  Leg pain  ? Ms  Consider IT band  Monitor stretch recheck if  persistent or progressive .  Asthma allergy stable on meds    Refill meds  Wellness check in 6 months.  Or prn

## 2010-10-03 NOTE — Progress Notes (Signed)
  Subjective:    Patient ID: Alexandra [name], female    DOB: [birth year], 25 y.o.   MRN: [id number]  HPI Patient comes in for follow up of  ADHD  Meds.        Review of Systems     Objective:   Physical Exam        Assessment & Plan:

## 2010-11-01 ENCOUNTER — Telehealth: Payer: Self-pay | Admitting: Internal Medicine

## 2010-11-01 NOTE — Telephone Encounter (Signed)
Pt called to req copy of immunization record. Pt is starting new job at hospital. Pt also would like to req to be tested for lyme disease. Pt was dx with Fibro 4 yrs ago and meds prescribed for this, aren't working.

## 2010-11-02 NOTE — Telephone Encounter (Signed)
Per Dr. Fabian Sharp- Needs ov to discuss. Who dx with fibro and we need records. Pt states that we referred her to neuro so we should have records but she will call over there and get them for [place]. She is also aware that her immunization records are up front for her to pick up.

## 2010-11-05 ENCOUNTER — Encounter: Payer: Self-pay | Admitting: Internal Medicine

## 2010-11-05 ENCOUNTER — Ambulatory Visit (INDEPENDENT_AMBULATORY_CARE_PROVIDER_SITE_OTHER): Payer: BC Managed Care – PPO | Admitting: Internal Medicine

## 2010-11-05 VITALS — BP 110/60 | HR 60 | Wt 167.0 lb

## 2010-11-05 DIAGNOSIS — M129 Arthropathy, unspecified: Secondary | ICD-10-CM

## 2010-11-05 DIAGNOSIS — M1990 Unspecified osteoarthritis, unspecified site: Secondary | ICD-10-CM

## 2010-11-05 DIAGNOSIS — J309 Allergic rhinitis, unspecified: Secondary | ICD-10-CM

## 2010-11-05 DIAGNOSIS — F909 Attention-deficit hyperactivity disorder, unspecified type: Secondary | ICD-10-CM

## 2010-11-05 DIAGNOSIS — M549 Dorsalgia, unspecified: Secondary | ICD-10-CM

## 2010-11-05 DIAGNOSIS — IMO0001 Reserved for inherently not codable concepts without codable children: Secondary | ICD-10-CM

## 2010-11-05 LAB — BASIC METABOLIC PANEL
BUN: 8 mg/dL (ref 6–23)
Calcium: 9.1 mg/dL (ref 8.4–10.5)
Chloride: 104 mEq/L (ref 96–112)
Creatinine, Ser: 0.7 mg/dL (ref 0.4–1.2)

## 2010-11-05 LAB — CBC WITH DIFFERENTIAL/PLATELET
Basophils Relative: 0.4 % (ref 0.0–3.0)
Eosinophils Relative: 0.7 % (ref 0.0–5.0)
HCT: 40.1 % (ref 36.0–46.0)
Monocytes Relative: 8.6 % (ref 3.0–12.0)
Neutrophils Relative %: 53.3 % (ref 43.0–77.0)
Platelets: 214 10*3/uL (ref 150.0–400.0)
RBC: 4.33 Mil/uL (ref 3.87–5.11)
WBC: 8.4 10*3/uL (ref 4.5–10.5)

## 2010-11-05 LAB — POCT URINALYSIS DIPSTICK
Nitrite, UA: NEGATIVE
Protein, UA: NEGATIVE
Spec Grav, UA: 1.015
Urobilinogen, UA: 0.2

## 2010-11-05 LAB — HEPATIC FUNCTION PANEL
ALT: 12 U/L (ref 0–35)
Bilirubin, Direct: 0.1 mg/dL (ref 0.0–0.3)
Total Bilirubin: 0.7 mg/dL (ref 0.3–1.2)
Total Protein: 7.3 g/dL (ref 6.0–8.3)

## 2010-11-05 LAB — TSH: TSH: 0.85 u[IU]/mL (ref 0.35–5.50)

## 2010-11-05 LAB — SEDIMENTATION RATE: Sed Rate: 20 mm/hr (ref 0–22)

## 2010-11-05 NOTE — Assessment & Plan Note (Signed)
With radiculopathy treated by Dr. Marylene Land with various medicines including injections.

## 2010-11-05 NOTE — Patient Instructions (Signed)
Will notify you of  Results . Copy to you . Your knees pain could be from  Patellar  Tracking  Problem also.  Suggest you get copy of specialty evaluations for your records and future care  And share them with [place].

## 2010-11-05 NOTE — Progress Notes (Signed)
Subjective:    Patient ID: Alexandra [name], female    DOB: 08-06-1985, 25 y.o.   MRN: [id number]  HPI The patient comes in today with father who encouraged to visit because of concern about her diagnosis of fibromyalgia that could be Lyme disease. They remember in retrospect a tick  being pulled off of her before the onset of her symptoms.  She used to be very active and in sports. And then began having joint pains that began in her feet. She went to a podiatrist who then referred her to a rheumatologist that upon questioning in the exam room was probably Dr. Kellie Simmering.  He told her that she had fibromyalgia because of her pain points. Since that time she has had GI symptoms and went to Dr. Jarold Motto who told her she had IBS.  She has also had a back pain with radicular symptoms down her leg is being treated by her neurologist Dr Astrid Drafts neurological.   She has had 2 injections in her back that did not help but she was playing sports at that time. It is suggested that she could try another.  She had a trial of Cymbalta which made her sac has recently been changed to [facility] last week which seems to have been helping her pain some. She is having bilateral knee pain that has been somewhat chronic with occasional swelling and her father states that he got his orthopedist to do MRIs on her knees but they showed no disease. The specialist was in [place].  The specialist was not asked about the differential diagnosis of possible Lyme disease at the time.      Review of Systems No unusual rashes headaches paralysis chest pain shortness of breath change in bowel habits blood in her stool.    Objective:   Physical Exam Well-developed well-nourished in no acute distress HEENT is grossly normal OP is clear Neck supple without adenopathy Chest:  Clear to A&P without wheezes rales or rhonchi CV:  S1-S2 no gallops or murmurs peripheral perfusion is normal Abdomen:  Sof,t normal bowel  sounds without hepatosplenomegaly, no guarding rebound or masses no CVA tenderness EXTR  No acute changes  Crepitus over both patella and some valgus changes but no effusion today.  Skin no acute rashes.    Neuro grossly intact sensation not tested      Assessment & Plan:  Joint pains knees and back mostly Musculoskeletal pains with history of diagnosis of fibromyalgia.  Diagnosis was given after foot pain and knee pain by a rheumatologist. During the visit today Records were faxed over.  And diagnosis was  Poly  arthralgia with a borderline elevated ANA. No evidence of rheumatoid disease and no elevation of inflammatory markers.  In the number of significant tender points. She was given Flexeril at night at some point. Concern about other diagnoses.  Discussed at length the pandoras box of Lyme disease diagnoses.  Although it is reasonable to do the serology tests because of her arthritis arthralgia. And sudden onset of sx and hx of tick bite?( If deer or dog type) However fibromyalgia is a different symptom complex.  The back pain and radicular sx seem separate sx . If a serology were positive  Could do the oral antibiotic rx but would not make FM sx better.   Fortunately Lavora has a good attitude and is coping with what she has been functioning through her days.  Since she has not had monitoring blood work tests through her  specialists  all order basic blood work today including a reflex Lyme titer HLA-B27 which apparently was never done and celiac 10 panel..  Will let her know  results including a copy of results  . Then plan followup. IBS stable  Total visit > 50% spent counseling and coordinating care

## 2010-11-06 LAB — B. BURGDORFI ANTIBODIES: B burgdorferi Ab IgG+IgM: 0.63 {ISR}

## 2010-11-06 LAB — CELIAC PANEL 10
Tissue Transglut Ab: 10.8 U/mL (ref <20)
Tissue Transglutaminase Ab, IgA: 5.7 U/mL (ref <20)

## 2010-11-07 LAB — HLA-B27 ANTIGEN

## 2010-11-08 ENCOUNTER — Telehealth: Payer: Self-pay | Admitting: Internal Medicine

## 2010-11-08 NOTE — Telephone Encounter (Signed)
Pt would like blood work results °

## 2010-11-09 ENCOUNTER — Telehealth: Payer: Self-pay | Admitting: *Deleted

## 2010-11-09 ENCOUNTER — Other Ambulatory Visit (INDEPENDENT_AMBULATORY_CARE_PROVIDER_SITE_OTHER): Payer: BC Managed Care – PPO | Admitting: Internal Medicine

## 2010-11-09 DIAGNOSIS — R300 Dysuria: Secondary | ICD-10-CM

## 2010-11-09 DIAGNOSIS — Z0289 Encounter for other administrative examinations: Secondary | ICD-10-CM

## 2010-11-09 NOTE — Telephone Encounter (Signed)
Message copied by Tor Netters on Fri Nov 09, 2010  8:31 AM ------      Message from: [facility], [place] K      Created: Thu Nov 08, 2010  6:19 PM       Advise patient that all tests are normal.. Including lyme antibody  test.  Her urine has slight amount of WBC but this may be insignificant unless she is having bladder infection symptoms.            She does not have lyme disease.             Please respond to phone message requesting results and then get her a copy of results.

## 2010-11-09 NOTE — Telephone Encounter (Signed)
Pt aware of results. She does have some burning upon urination. Pt has been on Macrobid for recurrent UTI's. She thinks that she may be immune to the Macrobid. She has taken 2 doses of this so far. Pt uses [facility].

## 2010-11-09 NOTE — Telephone Encounter (Signed)
Per Dr. Fabian Sharp- have pt come in to get a urine culture. Pt aware and will come in this afternoon for a culture.

## 2010-11-13 NOTE — Progress Notes (Signed)
Pt aware of results 

## 2010-11-16 NOTE — Assessment & Plan Note (Signed)
[facility]                                 ON-CALL NOTE   CAROLINE, LONGIE                      MRN:          [id number]  DATE:06/15/2006                            DOB:          1985/08/22    DATE OF INTERACTION:  June 15, 2006 at 5:58 p.m.   PHONE NUMBER:  [phone]   CALLER:  Brita Romp.   OBJECTIVE:  The patient saw Dr. Fabian Sharp a couple weeks ago, had blood in  her stool.  On Friday night had right lower quadrant back pain.  Went to  the emergency room and had a CT scan done that was read as normal.  Was  given morphine and sent home with Percocet to take.  No diagnosis was  given.  She had blood in her stool again today.  Has an ultrasound  scheduled tomorrow for gynecology.  Had a colonoscopy a year ago,  presumably normal with no diverticula and diagnosed with irritable bowel  at that time.  She had a back MRI during this time that was negative.  She is constipated at times, uses a laxative to go, does not know  whether she has hemorrhoids or not.  The blood has been bright red and  is indeed from the rectum, not from the vaginal area.  She is not  particularly hurting right now.  She did have fever Friday night when  she went to the emergency room.  Has not had any this weekend.   ASSESSMENT:  Blood from the rectum with right lower back pain.   PLAN:  Would get the ultrasound tomorrow as scheduled, and then see Dr.  Fabian Sharp later in the week, possibly back to GI.   PRIMARY CARE Cesario Weidinger:  Dr. Fabian Sharp.  Home office is [place].     Arta Silence, MD  Electronically Signed    RNS/MedQ  DD: 06/15/2006  DT: 06/15/2006  Job #: [id number]

## 2010-11-16 NOTE — Assessment & Plan Note (Signed)
[facility]                         GASTROENTEROLOGY OFFICE NOTE   NAME:[name], Alexandra PASCH                    MRN:          [id number]  DATE:06/19/2006                            DOB:          16-Feb-1986    Alexandra [name] has had recurrent right CVA pain, and apparently has had  recurrent urinary tract infections, and has been on recurrent  antibiotics.  Has chronic IBS, and recently developed some bloody  diarrhea.  Was found to have a positive C. difficile toxin or titer, and  has been on appropriate doses of metronidazole, and has improvement in  terms of her crampy abdominal pain, diarrhea, and bleeding.  She and her  father come in today because of continued frustration because of her IBS-  type complaints despite her taking a year off from college to try to get  her life together.  She continues with typical IBS complaints of gas,  bloating, and crampy abdominal pain.  She has had otherwise negative  extensive GI workup from our previous notes of colonoscopy in June 2006.  Laboratory data at that time also showed normal CBC, sed rate, C-  reactive protein, and negative sprue serologies.  She also had a  transabdominal transpelvic ultrasound in May 2006 which was entirely  normal.  Upper abdominal ultrasound at that time also was normal without  evidence of cholelithiasis.   Patient was recently in the emergency room on June 15, 2006, again  with abdominal pain and back pain, and at that time was complaining of  some constipation.  Laboratory data showed a mild leukocytosis with a  left shift, and she did have C. difficile infection.  Urinalysis on  June 14, 2006 was unremarkable without evidence of pyuria.  CT scan  on June 14, 2006 was entirely normal except for probable  constipation.  CT of the abdomen also was unremarkable.  Other results  from the lab show negative stool exams for Giardia Cryptosporidia, and I  think she had a  negative stool culture.   EXAMINATION:  Exam today shows the patient to be healthy, in no acute  distress without any stigmata of chronic liver disease.  Her weight is 179 pounds, which is up 3 pounds from July 2006.  Blood  pressure is 100/60, which is compatible with her previous blood  pressures, and pulse was 62 and regular.  She, for some reason, was exquisitely sensitive to palpation in her CVA  areas, but I could not appreciate any masses, tenderness, erythema, or  increased in her back.  Her abdominal exam otherwise was unremarkable  without hepatosplenomegaly, masses or tenderness.  Bowel sounds were  normal.  She had a very flat affect.   ASSESSMENT:  1. Clostridium difficile colitis which appears to be resolving with      metronidazole therapy.  2. Chronic irritable bowel syndrome with alternating diarrhea,      constipation, gas and bloating.  It is certainly possible that she      has an element of chronic bacterial overgrowth syndrome.  3. Chronic back pain of unexplained etiology with history of recurrent  urinary tract infections.   RECOMMENDATIONS:  1. Complete metronidazole therapy.  2. Align probiotic therapy for several weeks.  3. After she completes metronidazole, prescribe Xifaxan 400 mg t.i.d.      for 10 days.  4. Check inflammatory bowel disease serology panel and repeat      urinalysis.  5. Consider referral to [facility] depending on clinical      course.     Vania Rea. Jarold Motto, MD, Caleen Essex, FAGA  Electronically Signed    DRP/MedQ  DD: 06/19/2006  DT: 06/19/2006  Job #: [id number]   cc:   Neta Mends. Fabian Sharp, MD

## 2010-11-16 NOTE — Assessment & Plan Note (Signed)
[facility]                                 ON-CALL NOTE   NAME:GRANTCharelle, Petrakis                    MRN:          [id number]  DATE:07/04/2006                            DOB:          21-May-1986    Father, Brita Romp, called from [phone], stating that his daughter  has UTI symptoms.  They called at 9:54 p.m.  I recommended they come to  the clinic on Saturday morning to be evaluated.  They asked if there is  something they can do until then.  I recommended they get AZO over-the-  counter, or go to the emergency room if the pain worsened tonight,  otherwise I will see her in the morning to evaluate her.     Lelon Perla, DO  Electronically Signed    Shawnie Dapper  DD: 07/05/2006  DT: 07/05/2006  Job #: [id number]   cc:   Neta Mends. Fabian Sharp, MD

## 2011-01-22 ENCOUNTER — Encounter: Payer: Self-pay | Admitting: Internal Medicine

## 2011-01-22 ENCOUNTER — Ambulatory Visit (INDEPENDENT_AMBULATORY_CARE_PROVIDER_SITE_OTHER): Payer: 59 | Admitting: Internal Medicine

## 2011-01-22 VITALS — BP 120/80 | HR 72 | Temp 99.0°F | Wt 168.0 lb

## 2011-01-22 DIAGNOSIS — G5790 Unspecified mononeuropathy of unspecified lower limb: Secondary | ICD-10-CM

## 2011-01-22 DIAGNOSIS — M545 Low back pain, unspecified: Secondary | ICD-10-CM

## 2011-01-22 DIAGNOSIS — M549 Dorsalgia, unspecified: Secondary | ICD-10-CM

## 2011-01-22 MED ORDER — METHYLPREDNISOLONE ACETATE 80 MG/ML IJ SUSP
120.0000 mg | Freq: Once | INTRAMUSCULAR | Status: AC
  Administered 2011-01-22: 120 mg via INTRAMUSCULAR

## 2011-01-22 MED ORDER — PREDNISONE 10 MG PO TABS: ORAL_TABLET | ORAL | Status: DC

## 2011-01-22 NOTE — Patient Instructions (Signed)
To begin the prednisone tablets tomorrow. Call your neurologist about refilling narcotic medication. Avoid prolonged sitting at work. Sitting can flareup back pain and pinched nerves.

## 2011-01-22 NOTE — Progress Notes (Signed)
  Subjective:    Patient ID: Alexandra [name], female    DOB: 1985-12-16, 25 y.o.   MRN: [id number]  HPI Patient comes in today for an acute work in visit. Onset a few days ago with heavy spasm , revolved and ocass twinge and sharp and throbbing and now constant .    And hard to get comfortable   At work.     Sitts and standing. Never this bad in the past.  Not leg but on right.   OUt of refills fr vicodin.  Calling today  Es .    Just  On savella for about  3 months.   Past history family history social history reviewed in the electronic medical record.  Review of Systems Negative for chest pain shortness of breath fever weakness no change in the numbness in her feet  no fall has had a change in work schedule now sitting a lot more during her job. No specific injury.  Past history family history social history reviewed in the electronic medical record. Past Medical History  Diagnosis Date  . Allergic rhinitis, cause unspecified   . Anal fissure   . ADHD (attention deficit hyperactivity disorder)   . Low back pain   . Atypical chest pain   . Anemia   . Myalgia and myositis, unspecified   . Headache   . Mononeuritis of lower limb, unspecified   . Screening examination for pulmonary tuberculosis   . Urinary tract infection, site not specified   . IBS (irritable bowel syndrome)   . History of Clostridium difficile infection     2007  . PVC (premature ventricular contraction)     had negative ECHO  . Hx of varicella    No past surgical history on file.  reports that she has never smoked. She does not have any smokeless tobacco history on file. She reports that she does not drink alcohol. Her drug history not on file. family history includes Asthma in her father; Hyperlipidemia in her father; and Hypertension in her mother. No Known Allergies     Objective:   Physical Exam Well-developed well-nourished in no acute distress but uncomfortable in certain positions. Affect  slightly flat   gait within normal limits. Tender mid spine upper lumbar area and to the right paralumbar area. Weakness noted DTRs are present sensation not tested.  Oriented x3 cranial nerves appear to be intact. No CVA pain.  Chest:  Clear to A&P without wheezes rales or rhonchi CV:  S1-S2 no gallops or murmurs peripheral perfusion is normal Skin no acute rashes  No vesicles     Assessment & Plan:  Acute low back pain radiating to the right Acute on chronic  Under care of the neurologists with history of neuropathy.  Difficult situation on multiple medicines for management perhaps prolonged sitting at her new job is aggravating it. Options discussed will give parenteral steroids as it helped for a few days last time and then pills of prednisone. She will refill her narcotic medication which he doesn't take up frequently from her specialist.  DepoMedrol  today and prednisone  Taper.  FU  with neuro before September  .    Addendum apparently  They  Cant get her in before September.. Consider other referral locally or other .

## 2011-01-23 ENCOUNTER — Telehealth: Payer: Self-pay | Admitting: Internal Medicine

## 2011-01-23 NOTE — Telephone Encounter (Signed)
Pt requesting refill amphetamine-dextroamphetamine (ADDERALL, 20MG ,) 20 MG tablet

## 2011-01-24 ENCOUNTER — Ambulatory Visit: Payer: BC Managed Care – PPO | Admitting: Internal Medicine

## 2011-01-24 NOTE — Telephone Encounter (Signed)
Fax sent.

## 2011-01-25 ENCOUNTER — Telehealth: Payer: Self-pay | Admitting: Internal Medicine

## 2011-01-25 NOTE — Telephone Encounter (Signed)
-   Refill Adderall

## 2011-01-28 MED ORDER — AMPHETAMINE-DEXTROAMPHETAMINE 20 MG PO TABS: 20.0000 mg | ORAL_TABLET | Freq: Two times a day (BID) | ORAL | Status: DC

## 2011-01-28 NOTE — Telephone Encounter (Signed)
Left message for pt to call back to let us know if she wants 90 days supply or 3- 30 days.

## 2011-01-28 NOTE — Telephone Encounter (Signed)
Pt called back stating that she wants a 90 days supply.

## 2011-01-28 NOTE — Telephone Encounter (Signed)
Rx ready to pick up. Pt aware of this.

## 2011-04-10 ENCOUNTER — Ambulatory Visit (INDEPENDENT_AMBULATORY_CARE_PROVIDER_SITE_OTHER): Payer: 59 | Admitting: Internal Medicine

## 2011-04-10 ENCOUNTER — Encounter: Payer: Self-pay | Admitting: Internal Medicine

## 2011-04-10 DIAGNOSIS — M419 Scoliosis, unspecified: Secondary | ICD-10-CM

## 2011-04-10 DIAGNOSIS — F909 Attention-deficit hyperactivity disorder, unspecified type: Secondary | ICD-10-CM

## 2011-04-10 DIAGNOSIS — M549 Dorsalgia, unspecified: Secondary | ICD-10-CM

## 2011-04-10 DIAGNOSIS — M4120 Other idiopathic scoliosis, site unspecified: Secondary | ICD-10-CM

## 2011-04-10 DIAGNOSIS — J45990 Exercise induced bronchospasm: Secondary | ICD-10-CM

## 2011-04-10 MED ORDER — AMPHETAMINE-DEXTROAMPHETAMINE 20 MG PO TABS: 20.0000 mg | ORAL_TABLET | Freq: Two times a day (BID) | ORAL | Status: DC

## 2011-04-10 NOTE — Patient Instructions (Signed)
Try taking 20 mg in am and 10 mg a few hours later to see if get better response  .   Call for refill and progress when   Need refill. Agree with continued exercise to help your back.

## 2011-04-10 NOTE — Progress Notes (Signed)
  Subjective:    Patient ID: Alexandra [name], female    DOB: 11-30-1985, 25 y.o.   MRN: [id number]  HPI  Patient comes in today for follow up of  multiple medical problems.   ADHD: taking adderall  About 30 mg in am     last most of day some1-2  Different times .  No se of meds no cp sob psych se.  Back : has seen spine specialist second opinion and not is a pt   program and  Pain much more tolerable. Trying to correct  Postural issues  .  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  Ha: controlled  Allergies stable asthma stable  Review of Systems Neg cp sob fever;no   New numbness or weakness  No cough rash rest of hpin ns   Past history family history social history reviewed in the electronic medical record.     Objective:   Physical Exam WDWN in nad  Neck no masses  Supple Chest:  Clear to A&P without wheezes rales or rhonchi CV:  S1-S2 no gallops or murmurs peripheral perfusion is normal Abdomen:  Sof,t normal bowel sounds without hepatosplenomegaly, no guarding rebound or masses no CVA tenderness  Reviewed last note from Dr Estella Husk sept 4     Assessment & Plan:  ADD Doing well on same meds 1-2 per day  No side effects of meds of consequence  Back pain radiation with scoliosis Spinal postural problems Asthma quiescent

## 2011-04-12 ENCOUNTER — Encounter: Payer: Self-pay | Admitting: Internal Medicine

## 2011-04-12 NOTE — Assessment & Plan Note (Signed)
Ok to maintain on 1-2 per day 20- 40 mg per day . 90 day rx given  . Then 6 months check

## 2011-04-16 ENCOUNTER — Other Ambulatory Visit (HOSPITAL_COMMUNITY): Payer: Self-pay | Admitting: Sports Medicine

## 2011-04-16 DIAGNOSIS — M25562 Pain in left knee: Secondary | ICD-10-CM

## 2011-04-21 ENCOUNTER — Ambulatory Visit (HOSPITAL_COMMUNITY)
Admission: RE | Admit: 2011-04-21 | Discharge: 2011-04-21 | Disposition: A | Discharge status: Home / Self Care | Payer: 59 | Source: Ambulatory Visit | Attending: Sports Medicine | Admitting: Sports Medicine

## 2011-04-21 DIAGNOSIS — M25569 Pain in unspecified knee: Secondary | ICD-10-CM | POA: Insufficient documentation

## 2011-04-21 DIAGNOSIS — M25562 Pain in left knee: Secondary | ICD-10-CM

## 2011-05-02 ENCOUNTER — Encounter: Payer: Self-pay | Admitting: Internal Medicine

## 2011-05-02 ENCOUNTER — Ambulatory Visit (INDEPENDENT_AMBULATORY_CARE_PROVIDER_SITE_OTHER): Payer: 59 | Admitting: Internal Medicine

## 2011-05-02 VITALS — BP 110/60 | HR 66 | Temp 98.2°F | Wt 171.0 lb

## 2011-05-02 DIAGNOSIS — R51 Headache: Secondary | ICD-10-CM

## 2011-05-02 MED ORDER — PREDNISONE 20 MG PO TABS: ORAL_TABLET | ORAL | Status: AC

## 2011-05-02 MED ORDER — KETOROLAC TROMETHAMINE 60 MG/2ML IM SOLN
60.0000 mg | Freq: Once | INTRAMUSCULAR | Status: AC
  Administered 2011-05-02: 60 mg via INTRAMUSCULAR

## 2011-05-02 NOTE — Patient Instructions (Signed)
IT is possible that weather is trigger  Even if not in the past. Ok to take Imitrex a couple more days. If needed can take prednisone for 5 days that sometimes can help   Put the migraine in remission. Call if  persistent or progressive or fever etc.

## 2011-05-02 NOTE — Progress Notes (Signed)
  Subjective:    Patient ID: Alexandra [name], female    DOB: [birth year], 25 y.o.   MRN: [id number]  HPI  Patient comes in today for SDA  For acute problem evaluation.  Onset of neck pain and then HA last week  . Took imitrex  Over weekend  Then got better and relapsed again.   No fever but had face flushig.  Nausea and LBP and neck pain. Vision change didn't happened. weather ususally not a trigger.   Review of Systems NO fever uri sx trauma VD rash  Has some nausea with migraine  Back about the same continues to exercise   Rest non c   Past history family history social history reviewed in the electronic medical record.     Objective:   Physical Exam  WDWN in nad mildly uncomfortable HEENT: Normocephalic ;atraumatic , Eyes;  PERRL, EOMs  Full, lids and conjunctiva clear,,Ears: no deformities, canals nl, TM landmarks normal, Nose: no deformity or discharge  Mouth : OP clear without lesion or edema . CV rr no g or m  Neck supple co pain at lateral musclke neck but no meningismus.   No mass or adenopathy NEuro oriented non focal   dtrs present  CN 3-12 seem ok  Grossly non focal exam  Gait wnl Oriented x 3 and no noted deficits in memory, attention, and speech.      Assessment & Plan:  Headache   Exacerbation acute  Migraines waxing and waning long lasting   Slightly atypical from reg migraines   Observe toradol im now  and pred if needed. Call if not getting better
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
## 2011-06-27 ENCOUNTER — Other Ambulatory Visit: Payer: Self-pay | Admitting: Internal Medicine

## 2011-08-08 ENCOUNTER — Telehealth: Payer: Self-pay | Admitting: Internal Medicine

## 2011-08-08 ENCOUNTER — Emergency Department (HOSPITAL_COMMUNITY): Payer: 59

## 2011-08-08 ENCOUNTER — Emergency Department (HOSPITAL_COMMUNITY)
Admission: EM | Admit: 2011-08-08 | Discharge: 2011-08-08 | Disposition: A | Discharge status: Home / Self Care | Payer: 59 | Attending: Emergency Medicine | Admitting: Emergency Medicine

## 2011-08-08 ENCOUNTER — Encounter (HOSPITAL_COMMUNITY): Payer: Self-pay | Admitting: *Deleted

## 2011-08-08 DIAGNOSIS — F909 Attention-deficit hyperactivity disorder, unspecified type: Secondary | ICD-10-CM | POA: Insufficient documentation

## 2011-08-08 DIAGNOSIS — M549 Dorsalgia, unspecified: Secondary | ICD-10-CM | POA: Insufficient documentation

## 2011-08-08 DIAGNOSIS — Z79899 Other long term (current) drug therapy: Secondary | ICD-10-CM | POA: Insufficient documentation

## 2011-08-08 DIAGNOSIS — N23 Unspecified renal colic: Secondary | ICD-10-CM | POA: Insufficient documentation

## 2011-08-08 DIAGNOSIS — R630 Anorexia: Secondary | ICD-10-CM | POA: Insufficient documentation

## 2011-08-08 DIAGNOSIS — R10819 Abdominal tenderness, unspecified site: Secondary | ICD-10-CM | POA: Insufficient documentation

## 2011-08-08 DIAGNOSIS — R110 Nausea: Secondary | ICD-10-CM | POA: Insufficient documentation

## 2011-08-08 DIAGNOSIS — R109 Unspecified abdominal pain: Secondary | ICD-10-CM | POA: Insufficient documentation

## 2011-08-08 LAB — COMPREHENSIVE METABOLIC PANEL
ALT: 12 U/L (ref 0–35)
AST: 19 U/L (ref 0–37)
Albumin: 4 g/dL (ref 3.5–5.2)
Alkaline Phosphatase: 59 U/L (ref 39–117)
Calcium: 9.5 mg/dL (ref 8.4–10.5)
GFR calc Af Amer: >90 mL/min (ref >90)
Glucose, Bld: 75 mg/dL (ref 70–99)
Potassium: 3.9 mEq/L (ref 3.5–5.1)
Sodium: 137 mEq/L (ref 135–145)
Total Protein: 7.9 g/dL (ref 6.0–8.3)

## 2011-08-08 LAB — URINE MICROSCOPIC-ADD ON

## 2011-08-08 LAB — URINALYSIS, ROUTINE W REFLEX MICROSCOPIC
Bilirubin Urine: NEGATIVE
Glucose, UA: NEGATIVE mg/dL
Ketones, ur: NEGATIVE mg/dL
Nitrite: NEGATIVE
Protein, ur: NEGATIVE mg/dL
Specific Gravity, Urine: 1.025 (ref 1.005–1.030)
Urobilinogen, UA: 0.2 mg/dL (ref 0.0–1.0)
pH: 7 (ref 5.0–8.0)

## 2011-08-08 LAB — DIFFERENTIAL
Basophils Absolute: 0 10*3/uL (ref 0.0–0.1)
Basophils Relative: 0 % (ref 0–1)
Eosinophils Absolute: 0.1 10*3/uL (ref 0.0–0.7)
Eosinophils Relative: 1 % (ref 0–5)
Neutrophils Relative %: 50 % (ref 43–77)

## 2011-08-08 LAB — CBC
MCH: 30.6 pg (ref 26.0–34.0)
MCHC: 35.1 g/dL (ref 30.0–36.0)
MCV: 87.3 fL (ref 78.0–100.0)
Platelets: 192 10*3/uL (ref 150–400)
RBC: 4.64 MIL/uL (ref 3.87–5.11)
RDW: 12.9 % (ref 11.5–15.5)

## 2011-08-08 LAB — POCT PREGNANCY, URINE: Preg Test, Ur: NEGATIVE

## 2011-08-08 NOTE — ED Provider Notes (Signed)
History     CSN: [id number]  Arrival date & time 08/08/11  1237   First MD Initiated Contact with Patient 08/08/11 1303      Chief Complaint  Patient presents with  . Flank Pain  . Nausea    (Consider location/radiation/quality/duration/timing/severity/associated sxs/prior treatment) HPI Comments: Ms. Kennedy Bucker complains of 2 day history of nausea, worsening chronic back pain, and intermittent shooting RLQ abdominal pains. Her nausea has been constant and is worsened by eating. She denies vomiting.  She has not tried taking anything to relieve the nausea. Ms. Kennedy Bucker has chronic right-sided lumbar pain. This pain has been constant for the past two days and has increased in severity. She has not taken anything to relieve this pain aside from her regular meds (gabapentin, vicodin ES). The shooting abdominal pain is located in her right lower quadrant. She describes the pain as sharp. It radiates from her right side down to her right lower abdomen. The pain comes on suddenly, lasts for a few seconds, then goes away. She had 3-4 episodes of this pain yesterday evening and 2-3 today. She denies fever, dysuria, hematuria, change to bowel movements, and blood in stool. LMP was 2 weeks ago and was normal for her. No history of kidney stones, no history of abdominal surgery.   Patient is a 26 y.o. female presenting with flank pain.  Flank Pain This is a new problem. The current episode started in the past 7 days. The problem occurs intermittently. The problem has been gradually worsening. Associated symptoms include abdominal pain and nausea. Pertinent negatives include no chest pain, chills, coughing, fever, headaches, myalgias, rash, sore throat or vomiting. The symptoms are aggravated by nothing. She has tried oral narcotics for the symptoms. The treatment provided mild relief.    Past Medical History  Diagnosis Date  . Allergic rhinitis, cause unspecified   . Anal fissure   . ADHD (attention deficit  hyperactivity disorder)   . Low back pain   . Atypical chest pain   . Anemia   . Myalgia and myositis, unspecified   . Headache   . Mononeuritis of lower limb, unspecified   . Screening examination for pulmonary tuberculosis   . Urinary tract infection, site not specified   . IBS (irritable bowel syndrome)   . History of Clostridium difficile infection     2007  . PVC (premature ventricular contraction)     had negative ECHO  . Hx of varicella     Past Surgical History  Procedure Date  . Breast surgery     augmentation    Family History  Problem Relation Age of Onset  . Hypertension Mother   . Hyperlipidemia Father   . Asthma Father     History  Substance Use Topics  . Smoking status: Never Smoker   . Smokeless tobacco: Not on file  . Alcohol Use: No    OB History    Grav Para Term Preterm Abortions TAB SAB Ect Mult Living                  Review of Systems  Constitutional: Positive for appetite change (no appetite, but able to eat). Negative for fever and chills.  HENT: Negative for sore throat and rhinorrhea.   Eyes: Negative for redness.  Respiratory: Negative for cough.   Cardiovascular: Negative for chest pain.  Gastrointestinal: Positive for nausea and abdominal pain. Negative for vomiting, diarrhea, blood in stool and anal bleeding.  Genitourinary: Positive for flank pain. Negative for  dysuria, frequency, hematuria, vaginal bleeding, vaginal discharge and difficulty urinating.  Musculoskeletal: Positive for back pain. Negative for myalgias.  Skin: Negative for rash.  Neurological: Negative for headaches.    Allergies  Review of patient's allergies indicates no known allergies.  Home Medications   Current Outpatient Rx  Name Route Sig Dispense Refill  . ALBUTEROL SULFATE HFA 108 (90 BASE) MCG/ACT IN AERS Inhalation Inhale 2 puffs into the lungs every 6 (six) hours as needed.      . AMPHETAMINE-DEXTROAMPHETAMINE 20 MG PO TABS Oral Take 1 tablet (20  mg total) by mouth 2 (two) times daily. 180 tablet 0  . ETONOGESTREL-ETHINYL ESTRADIOL 0.12-0.015 MG/24HR VA RING Vaginal Place 1 each vaginally every 28 (twenty-eight) days. Insert vaginally and leave in place for 3 consecutive weeks, then remove for 1 week.     Marland Kitchen FLUTICASONE PROPIONATE 50 MCG/ACT NA SUSP  USE 2 SPRAYS IN EACH NOSTRIL DAILY FOR NOSE ALLERGY 16 g 3  . GABAPENTIN 800 MG PO TABS Oral Take 800 mg by mouth 3 (three) times daily. 1 qam and 2 hs     . MONTELUKAST SODIUM 10 MG PO TABS Oral Take 10 mg by mouth at bedtime.      . SUMATRIPTAN SUCCINATE 100 MG PO TABS Oral Take 100 mg by mouth every 2 (two) hours as needed.        BP 112/83  Pulse 67  Temp(Src) 98.7 F (37.1 C) (Oral)  Resp 18  Ht 5\' 8"  (1.727 m)  Wt 165 lb (74.844 kg)  BMI 25.09 kg/m2  SpO2 100%  LMP 07/27/2011  Physical Exam  Nursing note and vitals reviewed. Constitutional: She is oriented to person, place, and time. She appears well-developed and well-nourished. No distress.  HENT:  Head: Normocephalic and atraumatic.  Eyes: Conjunctivae are normal. Right eye exhibits no discharge. Left eye exhibits no discharge.  Neck: Normal range of motion. Neck supple.  Cardiovascular: Normal rate, regular rhythm and normal heart sounds.   No murmur heard. Pulmonary/Chest: Effort normal and breath sounds normal.  Abdominal: Soft. Bowel sounds are normal. She exhibits no distension and no mass. There is tenderness (diffulsely tender throughout right abdomen). There is no rebound and no guarding.  Neurological: She is alert and oriented to person, place, and time.  Skin: Skin is warm and dry. She is not diaphoretic.  Psychiatric: She has a normal mood and affect.    ED Course  Procedures (including critical care time)  Labs Reviewed  URINALYSIS, ROUTINE W REFLEX MICROSCOPIC - Abnormal; Notable for the following:    APPearance TURBID (*)    Hgb urine dipstick SMALL (*)    Leukocytes, UA LARGE (*)    All other  components within normal limits  COMPREHENSIVE METABOLIC PANEL - Abnormal; Notable for the following:    Total Bilirubin 0.2 (*)    All other components within normal limits  URINE MICROSCOPIC-ADD ON - Abnormal; Notable for the following:    Squamous Epithelial / LPF FEW (*)    Bacteria, UA MANY (*)    Crystals CA OXALATE CRYSTALS (*)    All other components within normal limits  CBC  DIFFERENTIAL  POCT PREGNANCY, URINE  URINE CULTURE   US Renal  08/08/2011  *RADIOLOGY REPORT*  Clinical Data: 26 year old female with flank pain and nausea.  RENAL/URINARY TRACT ULTRASOUND COMPLETE  Comparison:  CT abdomen pelvis 06/14/2006.  Findings:  Right Kidney:  No hydronephrosis.  Renal length 11.5 cm.  Cortical echotexture within normal limits.  Left Kidney:  No hydronephrosis.  Renal length 11.2 cm.  Cortical echotexture within normal limits.  Bladder:  Unremarkable.  IMPRESSION: Normal renal ultrasound.  Original Report Authenticated By: Harley Hallmark, M.D.     1. Renal colic     1:59 PM Patient seen and examined. Work-up initiated. Pt declines pain medicine.   Vital signs reviewed and are as follows: Filed Vitals:   08/08/11 1245  BP: 112/83  Pulse: 67  Temp: 98.7 F (37.1 C)  Resp: 18   3:58 PM Patient was discussed with Gavin Pound. Ghim, MD Renal US ordered. Will avoid radiation from CT and order renal US to r/o significant hydronephrosis.   3:58 PM Patient in [place].  Patient had normal renal ultrasound. Patient and family were informed of results. She was given strainer and urged to followup with her primary care physician. Urine culture sent. Patient has home pain medication.  The patient was urged to return to the Emergency Department immediately with worsening of current symptoms, worsening abdominal pain, persistent vomiting, blood noted in stools, fever, or any other concerns. The patient verbalized understanding.     MDM  Patient with flank pain without fever. Suspicion for  kidney stone given blood in the calcium oxalate crystals in urine. Ultrasound performed in lieu of CT scan. Remainder of lab tests are reassuring. Kidney function is normal. Urine culture sent. Will treat as renal colic at this time. Return precautions given.        Eustace Moore [place], [place] 08/08/11 2021

## 2011-08-08 NOTE — ED Notes (Signed)
Pt unable to void @ present.

## 2011-08-08 NOTE — Telephone Encounter (Signed)
Pt called complaining of rt lower quadrant, abd pain. Pt was offered ov, per Triage, but pt declined visit and decided to go to ER.

## 2011-08-08 NOTE — Telephone Encounter (Signed)
Please do follow up call to patient to see how she is doing.

## 2011-08-08 NOTE — ED Notes (Signed)
Patient transported to Ultrasound 

## 2011-08-08 NOTE — ED Notes (Signed)
Pt states "been having pain x 2 days"; pt indicates pain is in right flank that is constant but then has sharp pain radiating into RLQ; denies dysruia

## 2011-08-09 NOTE — Telephone Encounter (Signed)
Spoke to pt and she is feeling okay but when she moves it starts to hurt. They says it is possibly a kidney stone. Pt is going to follow up with her urologist. But will call us if she needs anything in the mean time.

## 2011-08-09 NOTE — ED Provider Notes (Signed)
Medical screening examination/treatment/procedure(s) were performed by non-physician practitioner and as supervising physician I was immediately available for consultation/collaboration. Maddisyn Hegwood Y.   Gavin Pound. Oletta Lamas, MD 08/09/11 1610

## 2011-08-09 NOTE — Telephone Encounter (Signed)
Left message on machine to call back and let us know how she is doing.

## 2011-08-10 LAB — URINE CULTURE

## 2011-08-15 ENCOUNTER — Ambulatory Visit (INDEPENDENT_AMBULATORY_CARE_PROVIDER_SITE_OTHER): Payer: 59 | Admitting: Family

## 2011-08-15 ENCOUNTER — Encounter: Payer: Self-pay | Admitting: Family

## 2011-08-15 VITALS — BP 120/60 | Temp 98.2°F | Wt 175.0 lb

## 2011-08-15 DIAGNOSIS — N200 Calculus of kidney: Secondary | ICD-10-CM

## 2011-08-15 DIAGNOSIS — N10 Acute tubulo-interstitial nephritis: Secondary | ICD-10-CM

## 2011-08-15 LAB — POCT URINALYSIS DIPSTICK
Bilirubin, UA: NEGATIVE
Glucose, UA: NEGATIVE
Nitrite, UA: NEGATIVE
Spec Grav, UA: 1.02
Urobilinogen, UA: 1

## 2011-08-15 MED ORDER — SULFAMETHOXAZOLE-TRIMETHOPRIM 800-160 MG PO TABS: 1.0000 | ORAL_TABLET | Freq: Two times a day (BID) | ORAL | Status: AC

## 2011-08-15 NOTE — Progress Notes (Signed)
Subjective:    Patient ID: Alexandra [name], female    DOB: 11/07/1985, 26 y.o.   MRN: [id number]  HPI 26 year old white female, nonsmoker, patient of Dr. Fabian Sharp is in today with complaints of right lower back pain. She was seen in the emergency department one week ago diagnosed with urinary calculi. Her ultrasound did not show urinary calculi. However she did have a urine that had 3+ leukocytes and a culture showed multiple bacteria in her urine. The patient has pain in the right flank area that is constant, rates it a 7/10 without improvement. She does respond to pain medication. She denies any fever, lightheadedness, dizziness, nausea or vomiting, diarrhea or constipation.    Review of Systems  Constitutional: Negative.   Respiratory: Negative.   Cardiovascular: Negative.   Gastrointestinal: Positive for abdominal pain.  Genitourinary: Positive for flank pain. Negative for frequency.  Musculoskeletal: Positive for back pain.       Right flank pain  Skin: Negative.   Neurological: Negative.   Hematological: Negative.   Psychiatric/Behavioral: Negative.    Past Medical History  Diagnosis Date  . Allergic rhinitis, cause unspecified   . Anal fissure   . ADHD (attention deficit hyperactivity disorder)   . Low back pain   . Atypical chest pain   . Anemia   . Myalgia and myositis, unspecified   . Headache   . Mononeuritis of lower limb, unspecified   . Screening examination for pulmonary tuberculosis   . Urinary tract infection, site not specified   . IBS (irritable bowel syndrome)   . History of Clostridium difficile infection     2007  . PVC (premature ventricular contraction)     had negative ECHO  . Hx of varicella     History   Social History  . Marital Status: Single    Spouse Name: N/A    Number of Children: N/A  . Years of Education: N/A   Occupational History  . Not on file.   Social History Main Topics  . Smoking status: Never Smoker   . Smokeless  tobacco: Not on file  . Alcohol Use: No  . Drug Use: Not on file  . Sexually Active: Not on file   Other Topics Concern  . Not on file   Social History Narrative   Single graduated [employer].Living apt with roommate2 hhPet dog and cat exposure? Sweet tea a day maxGoes to the gym at [employer] the Lead pharmacy tech at [employer]Now works [employer].    Past Surgical History  Procedure Date  . Breast surgery     augmentation    Family History  Problem Relation Age of Onset  . Hypertension Mother   . Hyperlipidemia Father   . Asthma Father     No Known Allergies  Current Outpatient Prescriptions on File Prior to Visit  Medication Sig Dispense Refill  . albuterol (VENTOLIN HFA) 108 (90 BASE) MCG/ACT inhaler Inhale 2 puffs into the lungs every 6 (six) hours as needed.        Marland Kitchen amphetamine-dextroamphetamine (ADDERALL) 20 MG tablet Take 20 mg by mouth 2 (two) times daily. Use as needed for school purposes      . etonogestrel-ethinyl estradiol (NUVARING) 0.12-0.015 MG/24HR vaginal ring Place 1 each vaginally every 28 (twenty-eight) days. Insert vaginally and leave in place for 3 consecutive weeks, then remove for 1 week.       . fluticasone (FLONASE) 50 MCG/ACT nasal spray USE 2 SPRAYS IN  EACH NOSTRIL DAILY FOR NOSE ALLERGY  16 g  3  . gabapentin (NEURONTIN) 800 MG tablet Take 4,000 mg by mouth as directed. 1 morning, 1 afternoon and 3 at bedtime      . HYDROcodone-acetaminophen (VICODIN ES) 7.5-750 MG per tablet Take 1 tablet by mouth every 6 (six) hours as needed.      . SUMAtriptan (IMITREX) 100 MG tablet Take 100 mg by mouth every 2 (two) hours as needed.          BP 120/60  Temp(Src) 98.2 F (36.8 C) (Oral)  Wt 175 lb (79.379 kg)  LMP 01/26/2013chart     Objective:   Physical Exam  Constitutional: She is oriented to person, place, and time. She appears well-developed and well-nourished.  Neck: Normal range of motion. Neck  supple.  Pulmonary/Chest: Effort normal and breath sounds normal.  Abdominal: Soft. There is tenderness.       Tenderness noted to the pelvic region.  Genitourinary:       Right CVA tenderness to palpation.  Musculoskeletal: Normal range of motion.  Neurological: She is alert and oriented to person, place, and time.  Skin: Skin is warm and dry.  Psychiatric: She has a normal mood and affect.          Assessment & Plan:  Assessment: Acute pyelonephritis  Plan: Bactrim twice a day x10 days. Drink plenty of fluids. We'll bring patient back for recheck of her urine in one week. Patient to call the office if her symptoms worsen or persist, recheck as scheduled and when necessary. We'll send her urine for culture.Marland Kitchen

## 2011-08-15 NOTE — Patient Instructions (Signed)
Pyelonephritis, Adult Pyelonephritis is a kidney infection. In general, there are 2 main types of pyelonephritis:  Infections that come on quickly without any warning (acute pyelonephritis).   Infections that persist for a long period of time (chronic pyelonephritis).  CAUSES  Two main causes of pyelonephritis are:  Bacteria traveling from the bladder to the kidney. This is a problem especially in pregnant women. The urine in the bladder can become filled with bacteria from multiple causes, including:   Inflammation of the prostate gland (prostatitis).   Sexual intercourse in females.   Bladder infection (cystitis).   Bacteria traveling from the bloodstream to the tissue part of the kidney.  Problems that may increase your risk of getting a kidney infection include:  Diabetes.   Kidney stones or bladder stones.   Cancer.   Catheters placed in the bladder.   Other abnormalities of the kidney or ureter.  SYMPTOMS   Abdominal pain.   Pain in the side or flank area.   Fever.   Chills.   Upset stomach.   Blood in the urine (dark urine).   Frequent urination.   Strong or persistent urge to urinate.   Burning or stinging when urinating.  DIAGNOSIS  Your caregiver may diagnose your kidney infection based on your symptoms. A urine sample may also be taken. TREATMENT  In general, treatment depends on how severe the infection is.   If the infection is mild and caught early, your caregiver may treat you with oral antibiotics and send you home.   If the infection is more severe, the bacteria may have gotten into the bloodstream. This will require intravenous (IV) antibiotics and a hospital stay. Symptoms may include:   High fever.   Severe flank pain.   Shaking chills.   Even after a hospital stay, your caregiver may require you to be on oral antibiotics for a period of time.   Other treatments may be required depending upon the cause of the infection.  HOME CARE  INSTRUCTIONS   Take your antibiotics as directed. Finish them even if you start to feel better.   Make an appointment to have your urine checked to make sure the infection is gone.   Drink enough fluids to keep your urine clear or pale yellow.   Take medicines for the bladder if you have urgency and frequency of urination as directed by your caregiver.  SEEK IMMEDIATE MEDICAL CARE IF:   You have a fever.   You are unable to take your antibiotics or fluids.   You develop shaking chills.   You experience extreme weakness or fainting.   There is no improvement after 2 days of treatment.  MAKE SURE YOU:  Understand these instructions.   Will watch your condition.   Will get help right away if you are not doing well or get worse.  Document Released: 06/17/2005 Document Revised: 02/27/2011 Document Reviewed: 11/21/2010 Exit[employer] Patient Information 2012 [employer]. 

## 2011-08-17 LAB — URINE CULTURE
Colony Count: NO GROWTH
Organism ID, Bacteria: NO GROWTH

## 2011-08-19 ENCOUNTER — Telehealth: Payer: Self-pay | Admitting: Internal Medicine

## 2011-08-19 MED ORDER — CIPROFLOXACIN HCL 250 MG PO TABS: 250.0000 mg | ORAL_TABLET | Freq: Two times a day (BID) | ORAL | Status: AC

## 2011-08-19 MED ORDER — AMPHETAMINE-DEXTROAMPHETAMINE 20 MG PO TABS: 20.0000 mg | ORAL_TABLET | Freq: Two times a day (BID) | ORAL | Status: DC

## 2011-08-19 NOTE — Telephone Encounter (Signed)
(  ADDERALL) 20 MG tablet- needs refill//came in saw Padonda on Thursday says med she was given she is allergic to and ahe wanted to see if she can get Cipiro or something like that called in.

## 2011-08-19 NOTE — Telephone Encounter (Signed)
Rx sent to pharmacy   

## 2011-08-19 NOTE — Telephone Encounter (Signed)
Cipro 250mg  bid x 5 days #10 no refills

## 2011-08-19 NOTE — Telephone Encounter (Signed)
Rx printed out for the Adderall 20 mg for Dr. Fabian Sharp to sign. Will forward to Padonda to see about changing the antibiotic to Cipro.

## 2011-08-20 ENCOUNTER — Telehealth: Payer: Self-pay | Admitting: Internal Medicine

## 2011-08-20 NOTE — Telephone Encounter (Signed)
Come in and see any provider today.. Dr. Fabian Sharp preferably.

## 2011-08-20 NOTE — Telephone Encounter (Signed)
Patient called stating she is having a shooting pain on her right side with a burning sensation and it makes her nauseated. Please advise. Please call patient at work [phone]

## 2011-08-20 NOTE — Telephone Encounter (Signed)
Pt states she came in last week and was treated for an UTI and took Septra. Dr. Fabian Sharp gave her Cipro yesterday, but today she has moved her bowels x 3 times and has a sharp throbbing pain on right side with nausea.  Not sure about fever.  No vomiting. Not diarrhea, and no cramping. Pt is very worried about not getting better.  Please call and let her know whether to come in.  Very discouraged about this whole episode.

## 2011-08-20 NOTE — Telephone Encounter (Signed)
Per Dr. Fabian Sharp- can work in today but will have to wait because it will be a work in. Ask if she has seen her urologist. Find out were the pain is.   Spoke to pt- Left message to call back.

## 2011-08-20 NOTE — Telephone Encounter (Signed)
Spoke to pt- she hasn't seen the urologist yet. The pain is on the lower rt side but more intense. It's shift to the middle of her back and sometimes to the left. But constant on the rt side. She is about the freeze and others say she feels warm.   Spoke to [facility] and they told me to have pt call and ask for [facility]. Then they would have to set up appt from there. They couldn't tell me if they would be able to see pt today or not.

## 2011-08-21 ENCOUNTER — Telehealth: Payer: Self-pay | Admitting: Internal Medicine

## 2011-08-21 NOTE — Telephone Encounter (Signed)
Per convo with the nurse patient made an appt with urology for tomorrow however she would like the nurse to call and try to get her in today and would like a call back.

## 2011-08-21 NOTE — Telephone Encounter (Signed)
Spoke to Alexandra [name]- they couldn't see her yesterday. She is having severe lower abd pain. They couldn't see her today. The first thing they had was tomorrow.  I called [facility] and they can see her today at 11:15am by Dr. Laverle Patter. Alexandra [name] aware of this.

## 2011-08-23 ENCOUNTER — Ambulatory Visit: Payer: 59 | Admitting: Internal Medicine

## 2011-08-26 ENCOUNTER — Telehealth: Payer: Self-pay | Admitting: Internal Medicine

## 2011-08-26 DIAGNOSIS — N200 Calculus of kidney: Secondary | ICD-10-CM

## 2011-08-26 NOTE — Telephone Encounter (Signed)
Does this pt need to be dealing with [place] or [facility]???

## 2011-08-26 NOTE — Telephone Encounter (Signed)
Pt states she called [facility] and states she left a message for the doctor.  Pt states she has taken Miralax since Wednesday and mag ox on Friday.  Pt states she has been cleansed but is having worse sharpe pains.  Pt states she is waiting for a return call from [facility] and will call our office on tomorrow to report what she is told.  Pt offered OV with Dr. Fabian Sharp or GI.  Pt states she will wait to see what [facility] says first.

## 2011-08-26 NOTE — Telephone Encounter (Signed)
Pt did what [facility] told her to do. Pt did Miralax since Wed 08/21/11 and also Magnesium Citrate on Friday 08/23/11. Pt said that she is still having periodic sharp pain over rt kidney. Pt req call back from nurse. Needs to know what else she can do.

## 2011-08-28 NOTE — Telephone Encounter (Signed)
Pt would like Alexandra [name] to return her call

## 2011-08-28 NOTE — Telephone Encounter (Signed)
Per Dr. Fabian Sharp- can see if [place] will do this because we didn't see her for this problem. She is taking the vicodin from her neurologist.

## 2011-08-28 NOTE — Telephone Encounter (Signed)
Pt called back and wants [place] [facility]. She is going to get the records and CT from [facility] for them.

## 2011-08-28 NOTE — Telephone Encounter (Signed)
Ok but patient will have to get records sent that are not  From our office.  Please send the ed visit notes and scans

## 2011-08-28 NOTE — Telephone Encounter (Signed)
Spoke to pt- she saw the Urologist and they told her that she has 4 kidney stones. 2 in each kidney. She is having some pain. They done a CT scan and it show that she was backed up. She used miralax and mag citrate. She is cleared out. She is still having pain. Pt saw Dr. Laverle Patter.  Per Dr. Fabian Sharp- not sure there is anything she can do either. Can get all the records and come in for an appt. Pt also asked about seeing another Urologist. I advised her that she can ask about seeing another one in that group or getting her records and going to [place]. But to call us and let us know what we can do to help her out.

## 2011-08-28 NOTE — Telephone Encounter (Signed)
We sent the notes and pt has an appt for 3/18. Pt is wanting to know if she can get something stronger than vicodin in case she needs it because vicodin is not helping. She only wants in case she needs it until her appt.

## 2011-08-28 NOTE — Telephone Encounter (Signed)
Pt called back again and is req call back from [place] today.

## 2011-08-29 NOTE — Telephone Encounter (Signed)
Schedule a recheck.

## 2011-09-02 NOTE — Telephone Encounter (Signed)
Left message for pt to call back to schedule a recheck if she needs one or to let us know how she is doing

## 2011-09-04 ENCOUNTER — Telehealth: Payer: Self-pay | Admitting: Internal Medicine

## 2011-09-04 MED ORDER — AMPHETAMINE-DEXTROAMPHETAMINE 20 MG PO TABS: 20.0000 mg | ORAL_TABLET | Freq: Two times a day (BID) | ORAL | Status: DC

## 2011-09-04 NOTE — Telephone Encounter (Signed)
Patient called stating that she picked up her adderall rx and it should have been for 90 days and instead was more like 15days. Please assist and inform patient.

## 2011-09-04 NOTE — Telephone Encounter (Signed)
I told the patient she can come and pick up her rx.

## 2011-09-04 NOTE — Telephone Encounter (Signed)
Left a message for pt to return call.  Ok per Dr. Fabian Sharp to correct Adderall rx #180.

## 2011-09-30 ENCOUNTER — Other Ambulatory Visit: Payer: Self-pay | Admitting: Internal Medicine

## 2011-10-02 ENCOUNTER — Telehealth: Payer: Self-pay | Admitting: Family Medicine

## 2011-10-02 MED ORDER — MONTELUKAST SODIUM 10 MG PO TABS: 10.0000 mg | ORAL_TABLET | Freq: Every day | ORAL | Status: DC

## 2011-10-02 NOTE — Telephone Encounter (Signed)
Pt also needs refill on her Singulair 10mg . Her allergies are out of control. Also, she works at [employer]. Please send all Rx's there from now on - make this her new pharmacy. On 4/1 the Flon[facility] was sent [facility] - pt will call thema dn get that one transferred. Thanks!

## 2011-10-02 NOTE — Telephone Encounter (Signed)
Rx sent to [facility] for Singulair.  Pharmacy changed in pt's chart.

## 2011-10-03 ENCOUNTER — Encounter (HOSPITAL_COMMUNITY): Payer: Self-pay

## 2011-10-03 ENCOUNTER — Emergency Department (INDEPENDENT_AMBULATORY_CARE_PROVIDER_SITE_OTHER)
Admission: EM | Admit: 2011-10-03 | Discharge: 2011-10-03 | Disposition: A | Discharge status: Home Health | Payer: 59 | Source: Home / Self Care | Attending: Emergency Medicine | Admitting: Emergency Medicine

## 2011-10-03 DIAGNOSIS — J029 Acute pharyngitis, unspecified: Secondary | ICD-10-CM

## 2011-10-03 HISTORY — DX: Calculus of kidney: N20.0

## 2011-10-03 LAB — POCT RAPID STREP A: Streptococcus, Group A Screen (Direct): NEGATIVE

## 2011-10-03 MED ORDER — LIDOCAINE VISCOUS 2 % MT SOLN: 10.0000 mL | Freq: Three times a day (TID) | OROMUCOSAL | Status: AC | PRN

## 2011-10-03 MED ORDER — FEXOFENADINE-PSEUDOEPHED ER 60-120 MG PO TB12: 1.0000 | ORAL_TABLET | Freq: Two times a day (BID) | ORAL | Status: DC

## 2011-10-03 MED ORDER — PSEUDOEPHEDRINE-GUAIFENESIN ER 120-1200 MG PO TB12: 1.0000 | ORAL_TABLET | Freq: Two times a day (BID) | ORAL | Status: DC | PRN

## 2011-10-03 NOTE — ED Notes (Signed)
Pt c/o sore throat onset Sunday.  Pt states she felt like it was seasonal allergies but SX seem to be worsening.  Pt using singulair with no relief.

## 2011-10-03 NOTE — ED Provider Notes (Signed)
History     CSN: [id number]  Arrival date & time 10/03/11  1536   First MD Initiated Contact with Patient 10/03/11 1601      Chief Complaint  Patient presents with  . Sore Throat    (Consider location/radiation/quality/duration/timing/severity/associated sxs/prior treatment) HPI Comments: Patient reports sore throat, nasal congestion, postnasal drip x4 days. Reports pain with swelling, and states that her throat seems like it is swelling shut. Reports raspy voice, but no voice changes. No nausea, vomiting, fevers, ear pain. No drooling, difficulty breathing. No swelling under tongue, facial swelling, trismus. No abdominal pain, rash. No reflux symptoms. States this feels similar to previous episodes of pharyngitis caused by her seasonal allergies, so she started taking Singulair and Flonase, but this has not helped. No sick contacts. Patient wants to check x-ray that this is not strep throat.  ROS as noted in HPI. All other ROS negative.   Patient is a 26 y.o. female presenting with pharyngitis. The history is provided by the patient. No language interpreter was used.  Sore Throat This is a new problem. The current episode started more than 2 days ago. The problem occurs constantly. Pertinent negatives include no chest pain, no abdominal pain, no headaches and no shortness of breath. The symptoms are aggravated by swallowing. The symptoms are relieved by nothing. Treatments tried: allergy medications. The treatment provided no relief.    Past Medical History  Diagnosis Date  . Allergic rhinitis, cause unspecified   . Anal fissure   . ADHD (attention deficit hyperactivity disorder)   . Low back pain   . Atypical chest pain   . Anemia   . Myalgia and myositis, unspecified   . Headache   . Mononeuritis of lower limb, unspecified   . Screening examination for pulmonary tuberculosis   . Urinary tract infection, site not specified   . IBS (irritable bowel syndrome)   . History of  Clostridium difficile infection     2007  . PVC (premature ventricular contraction)     had negative ECHO  . Hx of varicella   . Kidney stones     Past Surgical History  Procedure Date  . Breast surgery     augmentation    Family History  Problem Relation Age of Onset  . Hypertension Mother   . Hyperlipidemia Father   . Asthma Father     History  Substance Use Topics  . Smoking status: Never Smoker   . Smokeless tobacco: Not on file  . Alcohol Use: No    OB History    Grav Para Term Preterm Abortions TAB SAB Ect Mult Living                  Review of Systems  Respiratory: Negative for shortness of breath.   Cardiovascular: Negative for chest pain.  Gastrointestinal: Negative for abdominal pain.  Neurological: Negative for headaches.    Allergies  Septra  Home Medications   Current Outpatient Rx  Name Route Sig Dispense Refill  . ALBUTEROL SULFATE HFA 108 (90 BASE) MCG/ACT IN AERS Inhalation Inhale 2 puffs into the lungs every 6 (six) hours as needed.      . AMPHETAMINE-DEXTROAMPHETAMINE 20 MG PO TABS Oral Take 1 tablet (20 mg total) by mouth 2 (two) times daily. Use as needed for school purposes 180 tablet 0  . AMPHETAMINE-DEXTROAMPHETAMINE 20 MG PO TABS  Take 2 tabs daily Fill on or after 11/02/10 60 tablet 0  . AMPHETAMINE-DEXTROAMPHETAMINE 20 MG PO TABS  2 daily Fill on or after 12/03/10 60 tablet 0  . ETONOGESTREL-ETHINYL ESTRADIOL 0.12-0.015 MG/24HR VA RING Vaginal Place 1 each vaginally every 28 (twenty-eight) days. Insert vaginally and leave in place for 3 consecutive weeks, then remove for 1 week.     Marland Kitchen FEXOFENADINE-PSEUDOEPHED ER 60-120 MG PO TB12 Oral Take 1 tablet by mouth every 12 (twelve) hours. 14 tablet 0  . FLUTICASONE PROPIONATE 50 MCG/ACT NA SUSP  USE 2 SPRAYS IN EACH NOSTRIL DAILY FOR NOSE ALLERGY 16 g 3  . GABAPENTIN 800 MG PO TABS Oral Take 4,000 mg by mouth as directed. 1 morning, 1 afternoon and 3 at bedtime    . HYDROCODONE-ACETAMINOPHEN  7.5-750 MG PO TABS Oral Take 1 tablet by mouth every 6 (six) hours as needed.    Marland Kitchen LIDOCAINE VISCOUS 2 % MT SOLN Oral Take 10 mLs by mouth 3 (three) times daily as needed for pain. Swish and spit. Do not swallow. 100 mL 0  . MONTELUKAST SODIUM 10 MG PO TABS Oral Take 1 tablet (10 mg total) by mouth at bedtime. 30 tablet 1  . SUMATRIPTAN SUCCINATE 100 MG PO TABS Oral Take 100 mg by mouth every 2 (two) hours as needed.        BP 118/83  Pulse 62  Temp(Src) 98.9 F (37.2 C) (Oral)  Resp 16  SpO2 100%  LMP 09/21/2011  Physical Exam  Nursing note and vitals reviewed. Constitutional: She is oriented to person, place, and time. She appears well-developed and well-nourished. No distress.  HENT:  Head: Normocephalic and atraumatic. No trismus in the jaw.  Right Ear: Tympanic membrane normal.  Left Ear: Tympanic membrane normal.  Nose: Mucosal edema present. Right sinus exhibits no maxillary sinus tenderness and no frontal sinus tenderness. Left sinus exhibits no maxillary sinus tenderness and no frontal sinus tenderness.  Mouth/Throat: Mucous membranes are normal. Posterior oropharyngeal erythema present. No oropharyngeal exudate.  Eyes: Conjunctivae and EOM are normal.  Neck: Normal range of motion.  Cardiovascular: Normal rate.   Pulmonary/Chest: Effort normal.  Abdominal: She exhibits no distension.  Musculoskeletal: Normal range of motion.  Lymphadenopathy:    She has no cervical adenopathy.  Neurological: She is alert and oriented to person, place, and time.  Skin: Skin is warm and dry.  Psychiatric: She has a normal mood and affect. Her behavior is normal. Judgment and thought content normal.    ED Course  Procedures (including critical care time)   Labs Reviewed  POCT RAPID STREP A ([facility] ONLY)   No results found.   1. Pharyngitis     Results for orders placed during the hospital encounter of 10/03/11  POCT RAPID STREP A ([facility] ONLY)      Component Value  Range   Streptococcus, Group A Screen (Direct) NEGATIVE  NEGATIVE     MDM  Rapid strep negative. Findings most consistent with an allergic or viral pharyngitis. Sending home with viscous lidocaine, and Will start her on Allegra-D, have her continue her Flonase and Singulair. Discussed results with patient. Patient agrees with plan   Luiz Blare, MD 10/03/11 2049

## 2011-10-03 NOTE — Discharge Instructions (Signed)
Take the medication as written. continue the flonase and singulair.  You may take 600 mg of motrin with 1 gram of tylenol up to 4 times a day as needed for pain. This is an effective combination for pain.  Use a neti pot or the NeilMed sinus rinse as often as you want to to reduce nasal congestion. Follow the directions on the box.

## 2011-10-04 ENCOUNTER — Telehealth: Payer: Self-pay | Admitting: Family Medicine

## 2011-10-04 ENCOUNTER — Ambulatory Visit: Payer: 59 | Admitting: Internal Medicine

## 2011-10-04 MED ORDER — FLUTICASONE PROPIONATE 50 MCG/ACT NA SUSP: 2.0000 | Freq: Every day | NASAL | Status: DC

## 2011-10-04 MED ORDER — MONTELUKAST SODIUM 10 MG PO TABS: 10.0000 mg | ORAL_TABLET | Freq: Every day | ORAL | Status: DC

## 2011-10-04 NOTE — Telephone Encounter (Signed)
Pt requested a 90 day supply and send to [facility], Dr. Fabian Sharp did approve and I sent scripts e-scribe.

## 2011-10-09 ENCOUNTER — Ambulatory Visit (INDEPENDENT_AMBULATORY_CARE_PROVIDER_SITE_OTHER): Payer: 59 | Admitting: Internal Medicine

## 2011-10-09 ENCOUNTER — Encounter: Payer: Self-pay | Admitting: Internal Medicine

## 2011-10-09 VITALS — BP 100/78 | HR 85 | Temp 98.7°F | Wt 169.0 lb

## 2011-10-09 DIAGNOSIS — J309 Allergic rhinitis, unspecified: Secondary | ICD-10-CM

## 2011-10-09 DIAGNOSIS — N200 Calculus of kidney: Secondary | ICD-10-CM

## 2011-10-09 DIAGNOSIS — F909 Attention-deficit hyperactivity disorder, unspecified type: Secondary | ICD-10-CM

## 2011-10-09 DIAGNOSIS — IMO0001 Reserved for inherently not codable concepts without codable children: Secondary | ICD-10-CM

## 2011-10-09 DIAGNOSIS — G5790 Unspecified mononeuropathy of unspecified lower limb: Secondary | ICD-10-CM

## 2011-10-09 DIAGNOSIS — R51 Headache: Secondary | ICD-10-CM

## 2011-10-09 MED ORDER — SUMATRIPTAN SUCCINATE 100 MG PO TABS: 100.0000 mg | ORAL_TABLET | ORAL | Status: DC | PRN

## 2011-10-09 NOTE — Progress Notes (Signed)
Subjective:    Patient ID: Alexandra [name], female    DOB: 09/08/1985, 26 y.o.   MRN: [id number]  HPI Patient comes in today for follow up of  multiple medical problems.   [facility]  [facility]rologist  To see   About why having stones.     Back   Pain and on.   Work 4- 8 30.    Better at present good days and bad days see past notes. She had been seen in the emergency room and also by provider here eventually ended up at urology a [facility]. Urologist in [place] felt that the kidney stones are not causing her back pain. Currently she is stable. The followup visit in the week about her kidney stones.  Allergies  : Bed time of year  Clogged nose and throat and  Tight  Like shrinking ; she is on chronic Singulair and Flonase .   Headaches ; no change  not too often needs a refill on the Imitrex three-month supplies less expensive. Doesn't use them any  Neck pain that causes migraines.   She has had this ever since her neurologist did some nerve testing on the back of her neck but it is intermittent. Her neurologist is the one who gets her pain medicine and she only uses sparingly if needed.  ADHD.:  Using bid  Am and 1-2  In afernoon.  The effect of the medicine works Mostly all day  she's not taking the medicine on most weekends.   Seen in urgen care recently for sore throat.   Review of Systems Neg fever syncope cp sob currently bleeding or vision changes or mood changes   Past history family history social history reviewed in the electronic medical record. Outpatient Prescriptions Prior to Visit  Medication Sig Dispense Refill  . amphetamine-dextroamphetamine (ADDERALL) 20 MG tablet Take 1 tablet (20 mg total) by mouth 2 (two) times daily. Use as needed for school purposes  180 tablet  0  . etonogestrel-ethinyl estradiol (NUVARING) 0.12-0.015 MG/24HR vaginal ring Place 1 each vaginally every 28 (twenty-eight) days. Insert vaginally and leave in place for 3 consecutive weeks, then remove for 1  week.       . fluticasone (FLONASE) 50 MCG/ACT nasal spray Place 2 sprays into the nose daily.  48 g  3  . gabapentin (NEURONTIN) 800 MG tablet Take 4,000 mg by mouth as directed. 1 morning, 1 afternoon and 3 at bedtime      . HYDROcodone-acetaminophen (VICODIN ES) 7.5-750 MG per tablet Take 1 tablet by mouth every 6 (six) hours as needed.      . montelukast (SINGULAIR) 10 MG tablet Take 1 tablet (10 mg total) by mouth at bedtime.  90 tablet  3  . SUMAtriptan (IMITREX) 100 MG tablet Take 100 mg by mouth every 2 (two) hours as needed.        Marland Kitchen albuterol (VENTOLIN HFA) 108 (90 BASE) MCG/ACT inhaler Inhale 2 puffs into the lungs every 6 (six) hours as needed.        Marland Kitchen amphetamine-dextroamphetamine (ADDERALL, 20MG ,) 20 MG tablet Take 2 tabs daily Fill on or after 11/02/10  60 tablet  0  . amphetamine-dextroamphetamine (ADDERALL, 20MG ,) 20 MG tablet 2 daily Fill on or after 12/03/10  60 tablet  0  . fexofenadine-pseudoephedrine (ALLEGRA-D) 60-120 MG per tablet Take 1 tablet by mouth every 12 (twelve) hours.  14 tablet  0  . lidocaine (XYLOCAINE) 2 % solution Take 10 mLs by mouth 3 (three) times  daily as needed for pain. Swish and spit. Do not swallow.  100 mL  0       Objective:   Physical Exam BP 100/78  Pulse 85  Temp(Src) 98.7 F (37.1 C) (Oral)  Wt 169 lb (76.658 kg)  SpO2 99%  LMP 09/21/2011 HEENT: Normocephalic ;atraumatic , Eyes;  PERRL, EOMs  Full, lids and conjunctiva clear,,Ears: no deformities, canals nl, TM landmarks normal, Nose: no deformity or discharge  Congested  Mouth : OP clear without lesion or edema . Some drainage noted Neck: Supple without adenopathy or masses or bruits Chest:  Clear to A&P without wheezes rales or rhonchi CV:  S1-S2 no gallops or murmurs peripheral perfusion is normal Abdomen:  Sof,t normal bowel sounds without hepatosplenomegaly, no guarding rebound or masses no CVA tenderness' Neuro grossly non focal      Assessment & Plan:    adhd stable  Ok to  remain on meds and  Call for refill when due   cpx  And med check in 6 months   Back pain and hx of renal stones  That may not because the pain . Stable at present under eval for secondary causes.     Allergic  continue control meds  HAs   Refill imitrex 90 days  Radicular back pain per neuro   It appears she is due for HCM   After  Pt left left reviewed record and EHR is not complete  Hope it can be completed at her  [facility] .

## 2011-10-09 NOTE — Patient Instructions (Signed)
No change in meds an have you follow up with the urologist.   Call for  Refill when needed.   3 months supply.

## 2011-12-04 ENCOUNTER — Telehealth: Payer: Self-pay | Admitting: Internal Medicine

## 2011-12-04 MED ORDER — AMPHETAMINE-DEXTROAMPHETAMINE 20 MG PO TABS: 20.0000 mg | ORAL_TABLET | Freq: Two times a day (BID) | ORAL | Status: DC

## 2011-12-04 NOTE — Telephone Encounter (Signed)
Ok to refill 90 days  

## 2011-12-04 NOTE — Telephone Encounter (Signed)
Patient called stating that she need a refill of her adderall and would like a 3 mth supply. Please assist.

## 2011-12-04 NOTE — Telephone Encounter (Signed)
Called pt and left a message that rx ready for pick up.

## 2011-12-04 NOTE — Telephone Encounter (Signed)
Pt last seen 10/09/11.  Rx last filled on 09/04/11. Pls advise.

## 2011-12-04 NOTE — Telephone Encounter (Signed)
Addended by: Azucena Freed on: 12/04/2011 02:45 PM   Modules accepted: Orders

## 2012-03-31 ENCOUNTER — Telehealth: Payer: Self-pay | Admitting: Internal Medicine

## 2012-03-31 MED ORDER — AMPHETAMINE-DEXTROAMPHETAMINE 20 MG PO TABS: 20.0000 mg | ORAL_TABLET | Freq: Two times a day (BID) | ORAL | Status: DC

## 2012-03-31 NOTE — Telephone Encounter (Signed)
Pt called req refill of amphetamine-dextroamphetamine (ADDERALL) 20 MG tablet. Pt is aware that Dr Fabian Sharp is out of office this wk.

## 2012-03-31 NOTE — Telephone Encounter (Signed)
Printed 30 day supply for Dr. Clent Ridges to sign as [facility] is out of the office.

## 2012-04-06 ENCOUNTER — Other Ambulatory Visit: Payer: Self-pay | Admitting: Family Medicine

## 2012-04-06 MED ORDER — AMPHETAMINE-DEXTROAMPHETAMINE 20 MG PO TABS: 20.0000 mg | ORAL_TABLET | Freq: Two times a day (BID) | ORAL | Status: DC

## 2012-04-07 ENCOUNTER — Other Ambulatory Visit: Payer: Self-pay | Admitting: Family Medicine

## 2012-04-07 MED ORDER — AMPHETAMINE-DEXTROAMPHETAMINE 20 MG PO TABS: 20.0000 mg | ORAL_TABLET | Freq: Two times a day (BID) | ORAL | Status: DC

## 2012-04-20 ENCOUNTER — Ambulatory Visit (INDEPENDENT_AMBULATORY_CARE_PROVIDER_SITE_OTHER): Payer: PRIVATE HEALTH INSURANCE | Admitting: Internal Medicine

## 2012-04-20 ENCOUNTER — Encounter: Payer: Self-pay | Admitting: Internal Medicine

## 2012-04-20 VITALS — BP 112/66 | HR 66 | Temp 97.9°F | Wt 181.0 lb

## 2012-04-20 DIAGNOSIS — I8390 Asymptomatic varicose veins of unspecified lower extremity: Secondary | ICD-10-CM | POA: Insufficient documentation

## 2012-04-20 DIAGNOSIS — J45990 Exercise induced bronchospasm: Secondary | ICD-10-CM

## 2012-04-20 DIAGNOSIS — M549 Dorsalgia, unspecified: Secondary | ICD-10-CM

## 2012-04-20 DIAGNOSIS — R51 Headache: Secondary | ICD-10-CM

## 2012-04-20 DIAGNOSIS — G5790 Unspecified mononeuropathy of unspecified lower limb: Secondary | ICD-10-CM

## 2012-04-20 DIAGNOSIS — F909 Attention-deficit hyperactivity disorder, unspecified type: Secondary | ICD-10-CM

## 2012-04-20 NOTE — Patient Instructions (Signed)
Continue as discussed . Contact us for 90 refill when  Due  ROV in 6 months or as needed.  Consider water exercise  For your MS issues.  Consider seeing Dr Darrick Penna .    Sports med    If need more advice about exercise .

## 2012-04-20 NOTE — Progress Notes (Signed)
Subjective:    Patient ID: Alexandra [name], female    DOB: 1986-04-11, 26 y.o.   MRN: [id number]  HPI Patient comes in today for follow up of  multiple medical problems.  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  Per day.   Not taking on  Non work days.   Sometimes weekends.   Will be working . Denies se of med such as ha sleep cp sob gi related .   Back and legs    Pain : about  The same  Some days hurts more than others   And some nausea at night.     No recent renal stone. Sx    Neurology  Sees her every 3-4 months .   Has prn vicodin  7.5 750  Every 4-6 hours  If needed. Takes rarely   none recently . On gabapentin  Moving more in job trying to run.      HAs   Ok   / Chemical engineer  neuro follows in.   Takes muscle relaxant at times   Had Varicose vein ablation for reflux when went in for  Spider vein rx .  Legs feels less tired and uncomfortable .  Asthma quiescent no recent flare.  Review of Systems Neg fever cp sob depression panic  Bleeding   Syncope new injury joint swelling Outpatient Encounter Prescriptions as of 04/20/2012  Medication Sig Dispense Refill  . albuterol (VENTOLIN HFA) 108 (90 BASE) MCG/ACT inhaler Inhale 2 puffs into the lungs every 6 (six) hours as needed.        Marland Kitchen amphetamine-dextroamphetamine (ADDERALL) 20 MG tablet Take 1 tablet (20 mg total) by mouth 2 (two) times daily. Use as needed for school purposes  180 tablet  0  . butalbital-acetaminophen-caffeine (FIORICET WITH CODEINE) 50-325-40-30 MG per capsule Take 1 capsule by mouth every 6 (six) hours as needed.      . etonogestrel-ethinyl estradiol (NUVARING) 0.12-0.015 MG/24HR vaginal ring Place 1 each vaginally every 28 (twenty-eight) days. Insert vaginally and leave in place for 3 consecutive weeks, then remove for 1 week.       . fluticasone (FLONASE) 50 MCG/ACT nasal spray Place 2 sprays  into the nose daily.  48 g  3  . gabapentin (NEURONTIN) 800 MG tablet Take 4,000 mg by mouth as directed. 1 morning, 1 afternoon and 3 at bedtime      . HYDROcodone-acetaminophen (VICODIN ES) 7.5-750 MG per tablet Take 1 tablet by mouth every 6 (six) hours as needed.      . montelukast (SINGULAIR) 10 MG tablet Take 1 tablet (10 mg total) by mouth at bedtime.  90 tablet  3  . nitrofurantoin, macrocrystal-monohydrate, (MACROBID) 100 MG capsule Take 100 mg by mouth 2 (two) times daily as needed.      . orphenadrine (NORFLEX) 100 MG tablet Take 100 mg by mouth 2 (two) times daily.      . SUMAtriptan (IMITREX) 100 MG tablet Take 1 tablet (100 mg total) by mouth every 2 (two) hours as needed for migraine.  27 tablet  0  . valACYclovir (VALTREX) 1000 MG tablet Take 1,000 mg by mouth 2 (two) times daily.      Marland Kitchen amphetamine-dextroamphetamine (ADDERALL, 20MG ,) 20 MG tablet Take 2 tabs daily Fill on or after 11/02/10  60 tablet  0  . amphetamine-dextroamphetamine (ADDERALL, 20MG ,) 20 MG tablet 2 daily Fill on or after 12/03/10  60 tablet  0  . DISCONTD: fexofenadine-pseudoephedrine (ALLEGRA-D) 60-120 MG per tablet Take 1 tablet by mouth every 12 (twelve) hours.  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 Likes new job assignment    Objective:   Physical Exam  BP 112/66  Pulse 66  Temp 97.9 F (36.6 C) (Oral)  Wt 181 lb (82.101 kg)  SpO2 99%  LMP 03/28/2012 [place]  HEENT: Normocephalic ;atraumatic , Eyes;  PERRL, EOMs  Full, lids and conjunctiva clear,,Ears: no deformities, canals nl, TM landmarks normal, Nose: no deformity or discharge  Mouth : OP clear without lesion or edema . Neck: Supple without adenopathy or masses or bruits Chest:  Clear to A&P without wheezes rales or rhonchi CV:  S1-S2 no gallops or murmurs peripheral perfusion is normal Abdomen:  Sof,t normal bowel sounds without hepatosplenomegaly, no guarding rebound or masses no CVA tenderness Some  right back spasm     Assessment & Plan:  ADD ADHD;Doing well  On meds   cll for 90 day rx  Last filled oct 8  When due and ROV in 6 months.   Asthma ; quiescent HAs ;some migraine and some cervicogenic  Here and   Neuro  Given rx for fioricet but doesn't take it    Back legs pain; Had vv reslux and ablation some improvement in leg pain. Exercises prn likes to run but limits cause of back .  Mood ;denies depression.

## 2012-07-07 ENCOUNTER — Other Ambulatory Visit: Payer: Self-pay | Admitting: Internal Medicine

## 2012-07-07 MED ORDER — AMPHETAMINE-DEXTROAMPHETAMINE 20 MG PO TABS: 20.0000 mg | ORAL_TABLET | Freq: Two times a day (BID) | ORAL | Status: DC

## 2012-07-07 NOTE — Telephone Encounter (Signed)
Pt notified to pick up rx at the front desk.

## 2012-07-07 NOTE — Telephone Encounter (Signed)
Pt needs refill of amphetamine-dextroamphetamine (ADDERALL) 20 MG tablet.  (3 months)

## 2012-07-24 ENCOUNTER — Ambulatory Visit (INDEPENDENT_AMBULATORY_CARE_PROVIDER_SITE_OTHER): Payer: PRIVATE HEALTH INSURANCE | Admitting: Internal Medicine

## 2012-07-24 ENCOUNTER — Encounter: Payer: Self-pay | Admitting: Internal Medicine

## 2012-07-24 VITALS — BP 116/80 | HR 81 | Temp 97.8°F | Wt 184.0 lb

## 2012-07-24 DIAGNOSIS — R220 Localized swelling, mass and lump, head: Secondary | ICD-10-CM

## 2012-07-24 DIAGNOSIS — J069 Acute upper respiratory infection, unspecified: Secondary | ICD-10-CM | POA: Insufficient documentation

## 2012-07-24 DIAGNOSIS — R221 Localized swelling, mass and lump, neck: Secondary | ICD-10-CM

## 2012-07-24 DIAGNOSIS — R5383 Other fatigue: Secondary | ICD-10-CM

## 2012-07-24 DIAGNOSIS — R51 Headache: Secondary | ICD-10-CM

## 2012-07-24 LAB — CBC WITH DIFFERENTIAL/PLATELET
Eosinophils Relative: 1.1 % (ref 0.0–5.0)
Lymphocytes Relative: 27.7 % (ref 12.0–46.0)
Monocytes Relative: 11.1 % (ref 3.0–12.0)
Neutrophils Relative %: 59.8 % (ref 43.0–77.0)
Platelets: 239 10*3/uL (ref 150.0–400.0)
RBC: 4.42 Mil/uL (ref 3.87–5.11)
WBC: 8.6 10*3/uL (ref 4.5–10.5)

## 2012-07-24 LAB — T3, FREE: T3, Free: 2.6 pg/mL (ref 2.3–4.2)

## 2012-07-24 LAB — HEPATIC FUNCTION PANEL
Bilirubin, Direct: 0 mg/dL (ref 0.0–0.3)
Total Bilirubin: 0.7 mg/dL (ref 0.3–1.2)
Total Protein: 7.3 g/dL (ref 6.0–8.3)

## 2012-07-24 LAB — BASIC METABOLIC PANEL
BUN: 11 mg/dL (ref 6–23)
Creatinine, Ser: 0.7 mg/dL (ref 0.4–1.2)
GFR: 114.22 mL/min (ref >60.00)

## 2012-07-24 LAB — TSH: TSH: 1.14 u[IU]/mL (ref 0.35–5.50)

## 2012-07-24 LAB — T4, FREE: Free T4: 0.89 ng/dL (ref 0.60–1.60)

## 2012-07-24 NOTE — Progress Notes (Signed)
Chief Complaint  Patient presents with  . Cough    Currently taking z-pak.  Started it yesterday. Neck swelling    HPI: Patient comes in today for SDA for  new problem evaluation. Noted over the last couple days that her neck was getting bigger and awoke this morning and felt it was bigger than usual.  Not pain not hard to swallow. Has had cough for a week ,no fever.   Has had some coughing illness for about a week it went to her employee health yesterday at work  And given a z pack   for question diagnosis she's begun taking this without side effect.  No asthma flare fever specific pain but concerned about the size of her neck. She is also noted fatigue over the last 3 weeks without associated fever. Sleep she states is okay is now working 5 days a week instead of 4. ROS: See pertinent positives and negatives per HPI. No nausea vomiting diarrhea is having frequent headaches are mild and taking ibuprofen on a regular basis she is under the care of her neurologist it doesn't seem concerned about this. There is no plan to change.  Past Medical History  Diagnosis Date  . Allergic rhinitis, cause unspecified   . Anal fissure   . ADHD (attention deficit hyperactivity disorder)   . Low back pain   . Atypical chest pain   . Anemia   . Myalgia and myositis, unspecified   . Headache   . Mononeuritis of lower limb, unspecified   . Screening examination for pulmonary tuberculosis   . Urinary tract infection, site not specified   . IBS (irritable bowel syndrome)   . History of Clostridium difficile infection     2007  . PVC (premature ventricular contraction)     had negative ECHO  . Hx of varicella   . Kidney stones   . Varicose veins     ablation    Family History  Problem Relation Age of Onset  . Hypertension Mother   . Hyperlipidemia Father   . Asthma Father     History   Social History  . Marital Status: Single    Spouse Name: N/A    Number of Children: N/A  . Years of  Education: N/A   Social History Main Topics  . Smoking status: Never Smoker   . Smokeless tobacco: None  . Alcohol Use: No  . Drug Use: No  . Sexually Active: None   Other Topics Concern  . None   Social History Narrative   Single graduated [employer].Living apt with roommate2 hh  [name] Pet  2 dogs and cat exposure? 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  Now 5 days per week    Outpatient Encounter Prescriptions as of 07/24/2012  Medication Sig Dispense Refill  . amphetamine-dextroamphetamine (ADDERALL) 20 MG tablet Take 1 tablet (20 mg total) by mouth 2 (two) times daily. Use as needed for school purposes  180 tablet  0  . butalbital-acetaminophen-caffeine (FIORICET WITH CODEINE) 50-325-40-30 MG per capsule Take 1 capsule by mouth every 6 (six) hours as needed.      . etonogestrel-ethinyl estradiol (NUVARING) 0.12-0.015 MG/24HR vaginal ring Place 1 each vaginally every 28 (twenty-eight) days. Insert vaginally and leave in place for 3 consecutive weeks, then remove for 1 week.       Marland Kitchen  fluticasone (FLONASE) 50 MCG/ACT nasal spray Place 2 sprays into the nose daily.  48 g  3  . gabapentin (NEURONTIN) 800 MG tablet Take 4,000 mg by mouth as directed. 1 morning, 1 afternoon and 3 at bedtime      . HYDROcodone-acetaminophen (VICODIN ES) 7.5-750 MG per tablet Take 1 tablet by mouth every 6 (six) hours as needed.      . montelukast (SINGULAIR) 10 MG tablet Take 1 tablet (10 mg total) by mouth at bedtime.  90 tablet  3  . nitrofurantoin, macrocrystal-monohydrate, (MACROBID) 100 MG capsule Take 100 mg by mouth 2 (two) times daily as needed.      . orphenadrine (NORFLEX) 100 MG tablet Take 100 mg by mouth 2 (two) times daily.      . SUMAtriptan (IMITREX) 100 MG tablet Take 1 tablet (100 mg total) by mouth every 2 (two) hours as needed for  migraine.  27 tablet  0  . amphetamine-dextroamphetamine (ADDERALL, 20MG ,) 20 MG tablet Take 2 tabs daily Fill on or after 11/02/10  60 tablet  0  . amphetamine-dextroamphetamine (ADDERALL, 20MG ,) 20 MG tablet 2 daily Fill on or after 12/03/10  60 tablet  0  . [DISCONTINUED] albuterol (VENTOLIN HFA) 108 (90 BASE) MCG/ACT inhaler Inhale 2 puffs into the lungs every 6 (six) hours as needed.        . [DISCONTINUED] valACYclovir (VALTREX) 1000 MG tablet Take 1,000 mg by mouth 2 (two) times daily.        EXAM:  BP 116/80  Pulse 81  Temp 97.8 F (36.6 C) (Oral)  Wt 184 lb (83.462 kg)  SpO2 98%  LMP 06/26/2012  There is no height on file to calculate BMI.  GENERAL: vitals reviewed and listed above, alert, oriented, appears well hydrated and in no acute distress  HEENT: atraumatic, conjunctiva  clear, no obvious abnormalities on inspection of external nose and ears TMs are intact OP : no lesion edema or exudate no specific lesion mild OP redness  NECK: no obvious masses on inspection palpation does look full not red shoddy a.c. nodes thyroid is palpable but not tender and no specific nodules there is no crepitus or JVD noted  LUNGS: clear to auscultation bilaterally, no wheezes, rales or rhonchi, good air movement CV: HRRR, no clubbing cyanosis or  peripheral edema nl cap refill  Abdomen soft d and medically guarding or rebound MS: moves all extremities without noticeable focal  abnormality Skin some dryness some tattoos normal color PSYCH: pleasant and cooperative, no obvious depression or anxiety Wt Readings from Last 3 Encounters:  07/24/12 184 lb (83.462 kg)  04/20/12 181 lb (82.101 kg)  10/09/11 169 lb (76.658 kg)    ASSESSMENT AND PLAN:  Discussed the following assessment and plan:  1. Neck swelling  Basic metabolic panel, CBC with Differential, TSH, T4, free, T3, free, Hepatic function panel, Thyroid antibodies, US Soft Tissue Head/Neck   Uncertain cause no emphysematous changes  but cannot pretty dramatically thyroid is palpable but uncertain if this is the cause of haven't seen her a while   2. upper respiratory tract infection with cough     Under treatment by employee health with a Z-Pak  3. Fatigue  Basic metabolic panel, CBC with Differential, TSH, T4, free, T3, free, Hepatic function panel, Thyroid antibodies  4. Headache  Basic metabolic panel, CBC with Differential, TSH, T4, free, T3, free, Hepatic function panel, Thyroid antibodies   Using almost daily anti-inflammatories we'll check renal and liver tests  No alarm features on exam or history but will follow get thyroid ultrasound lab tests monitor:: Service was areas of concern  Of note she has had weight gain in the last year uncertain if this is related. -Patient advised to return or notify health care team  immediately if symptoms worsen or persist or new concerns arise.  Patient Instructions  Will check out your thyroid gland  With labs and then ultrasound . No other alarm symptoms today     Will notify you  of labs when available. And be contacted about ultrasound.   dont see infection or alarming   Findings today. Neta Mends. [place] M.D.

## 2012-07-24 NOTE — Patient Instructions (Signed)
Will check out your thyroid gland  With labs and then ultrasound . No other alarm symptoms today     Will notify you  of labs when available. And be contacted about ultrasound.   dont see infection or alarming   Findings today. Marland Kitchen

## 2012-07-27 LAB — THYROID ANTIBODIES: Thyroperoxidase Ab SerPl-aCnc: <10 IU/mL (ref <35.0)

## 2012-07-28 ENCOUNTER — Telehealth: Payer: Self-pay | Admitting: Internal Medicine

## 2012-07-28 ENCOUNTER — Ambulatory Visit
Admission: RE | Admit: 2012-07-28 | Discharge: 2012-07-28 | Disposition: A | Discharge status: Home / Self Care | Payer: PRIVATE HEALTH INSURANCE | Source: Ambulatory Visit | Attending: Internal Medicine | Admitting: Internal Medicine

## 2012-07-28 DIAGNOSIS — R221 Localized swelling, mass and lump, neck: Secondary | ICD-10-CM

## 2012-07-28 NOTE — Telephone Encounter (Signed)
Patient notified all is normal.

## 2012-07-28 NOTE — Telephone Encounter (Addendum)
Pt is call back requesting  ?xray on neck results

## 2012-08-15 ENCOUNTER — Other Ambulatory Visit: Payer: Self-pay

## 2012-08-31 ENCOUNTER — Ambulatory Visit: Payer: PRIVATE HEALTH INSURANCE | Admitting: Internal Medicine

## 2012-10-22 ENCOUNTER — Telehealth: Payer: Self-pay | Admitting: Internal Medicine

## 2012-10-22 MED ORDER — AMPHETAMINE-DEXTROAMPHETAMINE 20 MG PO TABS: 20.0000 mg | ORAL_TABLET | Freq: Two times a day (BID) | ORAL | Status: DC

## 2012-10-22 NOTE — Telephone Encounter (Signed)
Pt needs new rx generic adderall 20 mg twice a day #180.for 90 day supply

## 2012-10-22 NOTE — Telephone Encounter (Signed)
Printed for WP to sign. 

## 2012-10-22 NOTE — Telephone Encounter (Signed)
Patient notified by telephone that her rx is ready at the front desk.

## 2012-11-06 ENCOUNTER — Ambulatory Visit (INDEPENDENT_AMBULATORY_CARE_PROVIDER_SITE_OTHER): Payer: PRIVATE HEALTH INSURANCE | Admitting: Internal Medicine

## 2012-11-06 ENCOUNTER — Encounter: Payer: Self-pay | Admitting: Internal Medicine

## 2012-11-06 VITALS — BP 100/66 | HR 79 | Temp 98.2°F | Wt 180.0 lb

## 2012-11-06 DIAGNOSIS — J309 Allergic rhinitis, unspecified: Secondary | ICD-10-CM

## 2012-11-06 DIAGNOSIS — Z87898 Personal history of other specified conditions: Secondary | ICD-10-CM

## 2012-11-06 MED ORDER — FLUTICASONE PROPIONATE 50 MCG/ACT NA SUSP: 2.0000 | Freq: Every day | NASAL | Status: DC

## 2012-11-06 MED ORDER — MONTELUKAST SODIUM 10 MG PO TABS: 10.0000 mg | ORAL_TABLET | Freq: Every day | ORAL | Status: DC

## 2012-11-06 NOTE — Progress Notes (Signed)
Chief Complaint  Patient presents with  . Follow-up    Allergy medication management    HPI: Patient comes in today because of need for refill of her allergy medications. In the middle the pollen season her allergy still are symptomatic. She's taking Flonase every day with significant help in Singulair. She still gets some itchy eyes and nasal stuffiness but no wheezing or acute swelling.   Interim history she's had increasing migraine headaches since she had a spinal tap March 19 done by her neurologist because of white patches on her MRI to rule out a mass. Fortunately there were no MS bands however she had a post spinal tap headache a blood patch and still having headaches and migraines. She is learning to tolerate them standing and avoiding sitting to standing. Otherwise no new diagnosis she continues to work  ROS: See pertinent positives and negatives per HPI. No asthma symptoms Past Medical History  Diagnosis Date  . Allergic rhinitis, cause unspecified   . Anal fissure   . ADHD (attention deficit hyperactivity disorder)   . Low back pain   . Atypical chest pain   . Anemia   . Myalgia and myositis, unspecified   . Headache   . Mononeuritis of lower limb, unspecified   . Screening examination for pulmonary tuberculosis   . Urinary tract infection, site not specified   . IBS (irritable bowel syndrome)   . History of Clostridium difficile infection     2007  . PVC (premature ventricular contraction)     had negative ECHO  . Hx of varicella   . Kidney stones   . Varicose veins     ablation  . H/O headache following lumbar puncture     done to R/O MS    Family History  Problem Relation Age of Onset  . Hypertension Mother   . Hyperlipidemia Father   . Asthma Father     History   Social History  . Marital Status: Single    Spouse Name: N/A    Number of Children: N/A  . Years of Education: N/A   Social History Main Topics  . Smoking status: Never Smoker   .  Smokeless tobacco: None  . Alcohol Use: No  . Drug Use: No  . Sexually Active: None   Other Topics Concern  . None   Social History Narrative   Single    graduated [employer].   Living apt with roommate   2 hh  [name]    Pet  2 dogs and cat exposure   ? 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 amphetamine-dextroamphetamine (ADDERALL) 20 MG tablet Take 1 tablet (20 mg total) by mouth 2 (two) times daily. Use as needed for school purposes  180 tablet  0  . etonogestrel-ethinyl estradiol (NUVARING) 0.12-0.015 MG/24HR vaginal ring Place 1 each vaginally every 28 (twenty-eight) days. Insert vaginally and leave in place for 3 consecutive weeks, then remove for 1 week.      . fluticasone (FLONASE) 50 MCG/ACT nasal spray Place 2 sprays into the nose daily.  48 g  3  . gabapentin (NEURONTIN) 800 MG tablet Take 4,000 mg by  mouth as directed. 1 morning, 1 afternoon and 3 at bedtime      . Hydrocodone-Acetaminophen (VICODIN ES) 7.5-300 MG TABS Take by mouth.      . nitrofurantoin, macrocrystal-monohydrate, (MACROBID) 100 MG capsule Take 100 mg by mouth 2 (two) times daily as needed.      . orphenadrine (NORFLEX) 100 MG tablet Take 100 mg by mouth 2 (two) times daily.      . rizatriptan (MAXALT) 10 MG tablet Take 10 mg by mouth as needed for migraine. May repeat in 2 hours if needed      . [DISCONTINUED] fluticasone (FLONASE) 50 MCG/ACT nasal spray Place 2 sprays into the nose daily.  48 g  3  . [DISCONTINUED] HYDROcodone-acetaminophen (VICODIN ES) 7.5-750 MG per tablet Take 1 tablet by mouth every 6 (six) hours as needed.      Marland Kitchen amphetamine-dextroamphetamine (ADDERALL, 20MG ,) 20 MG tablet Take 2 tabs daily Fill on or  after 11/02/10  60 tablet  0  . amphetamine-dextroamphetamine (ADDERALL, 20MG ,) 20 MG tablet 2 daily Fill on or after 12/03/10  60 tablet  0  . montelukast (SINGULAIR) 10 MG tablet Take 1 tablet (10 mg total) by mouth at bedtime.  90 tablet  3  . [DISCONTINUED] butalbital-acetaminophen-caffeine (FIORICET WITH CODEINE) 50-325-40-30 MG per capsule Take 1 capsule by mouth every 6 (six) hours as needed.      . [DISCONTINUED] montelukast (SINGULAIR) 10 MG tablet Take 1 tablet (10 mg total) by mouth at bedtime.  90 tablet  3  . [DISCONTINUED] SUMAtriptan (IMITREX) 100 MG tablet Take 1 tablet (100 mg total) by mouth every 2 (two) hours as needed for migraine.  27 tablet  0   No facility-administered encounter medications on file as of 11/06/2012.    EXAM:  BP 100/66  Pulse 79  Temp(Src) 98.2 F (36.8 C) (Oral)  Wt 180 lb (81.647 kg)  BMI 27.38 kg/m2  SpO2 98%  LMP 10/16/2012  Body mass index is 27.38 kg/(m^2).  GENERAL: vitals reviewed and listed above, alert, oriented, appears well hydrated and in no acute distress has some mild nasal congestion HEENT: atraumatic, conjunctiva  clear, no obvious abnormalities on inspection of external nose and ears nose is congested face nontender OP : no lesion edema or exudate  NECK: no obvious masses on inspection palpation no adenopathy  LUNGS: clear to auscultation bilaterally, no wheezes, rales or rhonchi, good air movement  CV: HRRR, no clubbing cyanosis or  peripheral edema nl cap refill  Abdomen without organomegaly guarding or rebound MS: moves all extremities without noticeable focal  abnormality  PSYCH: pleasant and cooperative,   ASSESSMENT AND PLAN:  Discussed the following assessment and plan: Migraines  aggravateed post lp fortunatelyneg for  ms ALLERGIC RHINITIS - Continue current management consider adding nonsedating antihistamine if needed  H/O headache following lumbar puncture  -Patient advised to return or notify health care team   if symptoms worsen or persist or new concerns arise.  Patient Instructions  Medications refilled consider adding a nonsedating antihistamine on days where allergies are flaring.      Neta Mends. Debby Clyne M.D.

## 2012-11-06 NOTE — Patient Instructions (Signed)
Medications refilled consider adding a nonsedating antihistamine on days where allergies are flaring.

## 2013-03-09 ENCOUNTER — Ambulatory Visit: Payer: PRIVATE HEALTH INSURANCE | Admitting: Internal Medicine

## 2013-03-13 ENCOUNTER — Emergency Department (HOSPITAL_COMMUNITY)
Admission: EM | Admit: 2013-03-13 | Discharge: 2013-03-14 | Disposition: A | Discharge status: Home / Self Care | Payer: PRIVATE HEALTH INSURANCE | Attending: Emergency Medicine | Admitting: Emergency Medicine

## 2013-03-13 ENCOUNTER — Encounter (HOSPITAL_COMMUNITY): Payer: Self-pay | Admitting: *Deleted

## 2013-03-13 ENCOUNTER — Emergency Department (HOSPITAL_COMMUNITY): Payer: PRIVATE HEALTH INSURANCE

## 2013-03-13 DIAGNOSIS — N23 Unspecified renal colic: Secondary | ICD-10-CM | POA: Insufficient documentation

## 2013-03-13 DIAGNOSIS — F909 Attention-deficit hyperactivity disorder, unspecified type: Secondary | ICD-10-CM | POA: Insufficient documentation

## 2013-03-13 DIAGNOSIS — Z87440 Personal history of urinary (tract) infections: Secondary | ICD-10-CM | POA: Insufficient documentation

## 2013-03-13 DIAGNOSIS — Z862 Personal history of diseases of the blood and blood-forming organs and certain disorders involving the immune mechanism: Secondary | ICD-10-CM | POA: Insufficient documentation

## 2013-03-13 DIAGNOSIS — Z3202 Encounter for pregnancy test, result negative: Secondary | ICD-10-CM | POA: Insufficient documentation

## 2013-03-13 DIAGNOSIS — Z8719 Personal history of other diseases of the digestive system: Secondary | ICD-10-CM | POA: Insufficient documentation

## 2013-03-13 DIAGNOSIS — Z8679 Personal history of other diseases of the circulatory system: Secondary | ICD-10-CM | POA: Insufficient documentation

## 2013-03-13 DIAGNOSIS — R109 Unspecified abdominal pain: Secondary | ICD-10-CM | POA: Insufficient documentation

## 2013-03-13 DIAGNOSIS — R112 Nausea with vomiting, unspecified: Secondary | ICD-10-CM | POA: Insufficient documentation

## 2013-03-13 DIAGNOSIS — IMO0001 Reserved for inherently not codable concepts without codable children: Secondary | ICD-10-CM | POA: Insufficient documentation

## 2013-03-13 DIAGNOSIS — Z8619 Personal history of other infectious and parasitic diseases: Secondary | ICD-10-CM | POA: Insufficient documentation

## 2013-03-13 DIAGNOSIS — Z8669 Personal history of other diseases of the nervous system and sense organs: Secondary | ICD-10-CM | POA: Insufficient documentation

## 2013-03-13 DIAGNOSIS — N201 Calculus of ureter: Secondary | ICD-10-CM

## 2013-03-13 LAB — URINALYSIS, ROUTINE W REFLEX MICROSCOPIC
Glucose, UA: NEGATIVE mg/dL
Protein, ur: NEGATIVE mg/dL
Specific Gravity, Urine: 1.026 (ref 1.005–1.030)
Urobilinogen, UA: 0.2 mg/dL (ref 0.0–1.0)

## 2013-03-13 LAB — URINE MICROSCOPIC-ADD ON

## 2013-03-13 LAB — POCT PREGNANCY, URINE: Preg Test, Ur: NEGATIVE

## 2013-03-13 MED ORDER — HYDROMORPHONE HCL PF 1 MG/ML IJ SOLN
0.5000 mg | Freq: Once | INTRAMUSCULAR | Status: AC
  Administered 2013-03-13: 0.5 mg via INTRAVENOUS
  Filled 2013-03-13: qty 1

## 2013-03-13 MED ORDER — ONDANSETRON HCL 4 MG/2ML IJ SOLN
4.0000 mg | Freq: Once | INTRAMUSCULAR | Status: AC
  Administered 2013-03-13: 4 mg via INTRAVENOUS
  Filled 2013-03-13: qty 2

## 2013-03-13 MED ORDER — DEXTROSE 5 % IV SOLN
1.0000 g | INTRAVENOUS | Status: DC
  Administered 2013-03-14: 1 g via INTRAVENOUS
  Filled 2013-03-13: qty 10

## 2013-03-13 MED ORDER — KETOROLAC TROMETHAMINE 30 MG/ML IJ SOLN
30.0000 mg | Freq: Once | INTRAMUSCULAR | Status: AC
  Administered 2013-03-13: 30 mg via INTRAVENOUS
  Filled 2013-03-13: qty 1

## 2013-03-13 MED ORDER — OXYCODONE-ACETAMINOPHEN 5-325 MG PO TABS: 1.0000 | ORAL_TABLET | Freq: Four times a day (QID) | ORAL | Status: DC | PRN

## 2013-03-13 MED ORDER — ONDANSETRON HCL 8 MG PO TABS: 8.0000 mg | ORAL_TABLET | Freq: Three times a day (TID) | ORAL | Status: DC | PRN

## 2013-03-13 MED ORDER — HYDROMORPHONE HCL PF 1 MG/ML IJ SOLN
1.0000 mg | Freq: Once | INTRAMUSCULAR | Status: AC
  Administered 2013-03-13: 1 mg via INTRAVENOUS
  Filled 2013-03-13: qty 1

## 2013-03-13 MED ORDER — SODIUM CHLORIDE 0.9 % IV SOLN
INTRAVENOUS | Status: DC
  Administered 2013-03-13: 21:00:00 via INTRAVENOUS

## 2013-03-13 MED ORDER — CEPHALEXIN 500 MG PO CAPS: 500.0000 mg | ORAL_CAPSULE | Freq: Four times a day (QID) | ORAL | Status: DC

## 2013-03-13 NOTE — ED Provider Notes (Addendum)
CSN: [id number]     Arrival date & time 03/13/13  2015 History   First MD Initiated Contact with Patient 03/13/13 2052     Chief Complaint  Patient presents with  . Nephrolithiasis  . Emesis   (Consider location/radiation/quality/duration/timing/severity/associated sxs/prior Treatment) Patient is a 27 y.o. female presenting with vomiting. The history is provided by the patient.  Emesis Associated symptoms: no abdominal pain, no chills and no headaches   pt w hx kidney stones c/o right flank pain posteriorly for past day. Constant, dull, moderate. Waxes/wanes in intensity. Similar to prior kidney stone pain. Nausea. No vomiting or diarrhea. No anterior/abd/pelvic pain. lnmp 1 week ago. No vaginal discharge or bleeding. No dysuria or hematuria. No fever or chills. No flank or back trauma or strain.     Past Medical History  Diagnosis Date  . Allergic rhinitis, cause unspecified   . Anal fissure   . ADHD (attention deficit hyperactivity disorder)   . Low back pain   . Atypical chest pain   . Anemia   . Myalgia and myositis, unspecified   . Headache(784.0)   . Mononeuritis of lower limb, unspecified   . Screening examination for pulmonary tuberculosis   . Urinary tract infection, site not specified   . IBS (irritable bowel syndrome)   . History of Clostridium difficile infection     2007  . PVC (premature ventricular contraction)     had negative ECHO  . Hx of varicella   . Kidney stones   . Varicose veins     ablation  . H/O headache following lumbar puncture     done to R/O MS   Past Surgical History  Procedure Laterality Date  . Breast surgery      augmentation  . Endovenous ablation saphenous vein w/ laser     Family History  Problem Relation Age of Onset  . Hypertension Mother   . Hyperlipidemia Father   . Asthma Father    History  Substance Use Topics  . Smoking status: Never Smoker   . Smokeless tobacco: Not on file  . Alcohol Use: No   OB History   Grav  Para Term Preterm Abortions TAB SAB Ect Mult Living                 Review of Systems  Constitutional: Negative for fever and chills.  HENT: Negative for neck pain.   Eyes: Negative for redness.  Respiratory: Negative for shortness of breath.   Cardiovascular: Negative for chest pain.  Gastrointestinal: Positive for nausea. Negative for abdominal pain.  Genitourinary: Positive for flank pain.  Musculoskeletal: Negative for back pain.  Skin: Negative for rash.  Neurological: Negative for headaches.  Hematological: Does not bruise/bleed easily.  Psychiatric/Behavioral: Negative for confusion.    Allergies  Septra  Home Medications   Current Outpatient Rx  Name  Route  Sig  Dispense  Refill  . amphetamine-dextroamphetamine (ADDERALL) 20 MG tablet   Oral   Take 1 tablet (20 mg total) by mouth 2 (two) times daily. Use as needed for school purposes   180 tablet   0   . etonogestrel-ethinyl estradiol (NUVARING) 0.12-0.015 MG/24HR vaginal ring   Vaginal   Place 1 each vaginally every 28 (twenty-eight) days. Insert vaginally and leave in place for 3 consecutive weeks, then remove for 1 week.         . gabapentin (NEURONTIN) 800 MG tablet   Oral   Take 4,000 mg by mouth as directed. 1 morning,  1 afternoon and 3 at bedtime         . HYDROcodone-acetaminophen (NORCO) 7.5-325 MG per tablet   Oral   Take 1 tablet by mouth every 6 (six) hours as needed for pain.         . Magnesium 100 MG TABS   Oral   Take 1 tablet by mouth daily.         . montelukast (SINGULAIR) 10 MG tablet   Oral   Take 1 tablet (10 mg total) by mouth at bedtime.   90 tablet   3   . orphenadrine (NORFLEX) 100 MG tablet   Oral   Take 100 mg by mouth 2 (two) times daily.         . rizatriptan (MAXALT) 10 MG tablet   Oral   Take 10 mg by mouth as needed for migraine. May repeat in 2 hours if needed         . topiramate (TOPAMAX) 100 MG tablet   Oral   Take 100 mg by mouth 2 (two) times  daily.         . fluticasone (FLONASE) 50 MCG/ACT nasal spray   Nasal   Place 2 sprays into the nose daily.   48 g   3    BP 115/86  Pulse 70  Temp(Src) 98.6 F (37 C) (Oral)  Resp 18  SpO2 100%  LMP 02/22/2013 Physical Exam  Nursing note and vitals reviewed. Constitutional: She appears well-developed and well-nourished. No distress.  HENT:  Mouth/Throat: Oropharynx is clear and moist.  Eyes: Conjunctivae and EOM are normal. No scleral icterus.  Neck: Neck supple.  Cardiovascular: Normal rate and regular rhythm.   Pulmonary/Chest: Effort normal and breath sounds normal. No respiratory distress.  Abdominal: Soft. Normal appearance and bowel sounds are normal. She exhibits no distension and no mass. There is no tenderness. There is no rebound and no guarding.  Genitourinary:  No cva tenderness.  Musculoskeletal: She exhibits no edema.  Spine non tender  Neurological: She is alert.  Motor intact bilaterally. Steady gait.   Skin: Skin is warm and dry. No rash noted. She is not diaphoretic.  No shingles/rash in area of pain  Psychiatric: She has a normal mood and affect.    ED Course  Procedures (including critical care time)  Results for orders placed during the hospital encounter of 03/13/13  URINALYSIS, ROUTINE W REFLEX MICROSCOPIC      Result Value Range   Color, Urine YELLOW  YELLOW   APPearance CLOUDY (*) CLEAR   Specific Gravity, Urine 1.026  1.005 - 1.030   pH 6.5  5.0 - 8.0   Glucose, UA NEGATIVE  NEGATIVE mg/dL   Hgb urine dipstick LARGE (*) NEGATIVE   Bilirubin Urine NEGATIVE  NEGATIVE   Ketones, ur NEGATIVE  NEGATIVE mg/dL   Protein, ur NEGATIVE  NEGATIVE mg/dL   Urobilinogen, UA 0.2  0.0 - 1.0 mg/dL   Nitrite NEGATIVE  NEGATIVE   Leukocytes, UA SMALL (*) NEGATIVE  URINE MICROSCOPIC-ADD ON      Result Value Range   Squamous Epithelial / LPF FEW (*) RARE   WBC, UA 3-6  <3 WBC/hpf   RBC / HPF TOO NUMEROUS TO COUNT  <3 RBC/hpf   Bacteria, UA MANY (*)  RARE   Casts GRANULAR CAST (*) NEGATIVE  POCT PREGNANCY, URINE      Result Value Range   Preg Test, Ur NEGATIVE  NEGATIVE   Ct Abdomen Pelvis Wo Contrast  03/13/2013   CLINICAL DATA:  Right-sided pain.  History of stones.  EXAM: CT ABDOMEN AND PELVIS WITHOUT CONTRAST  TECHNIQUE: Multidetector CT imaging of the abdomen and pelvis was performed following the standard protocol without intravenous contrast.  COMPARISON:  06/14/2006  FINDINGS: Linear atelectasis in the lung bases dependently. No effusions. Heart is normal size.  Liver, gallbladder, spleen, pancreas, adrenals have an unremarkable unenhanced appearance. Bilateral nephrolithiasis. Right hydronephrosis. 5 mm proximal right ureteral stone. No additional ureteral stones. Urinary bladder is decompressed and grossly unremarkable.  Uterus and adnexa unremarkable. No free fluid, free air or adenopathy.  No acute bony abnormality.  IMPRESSION: 5 mm proximal right ureteral stone with mild right hydronephrosis.  Bilateral nephrolithiasis.   Electronically Signed   By: Charlett Nose M.D.   On: 03/13/2013 23:21     MDM  Iv ns. Dilaudid 1 mg iv. zofran iv.  Reviewed nursing notes and prior charts for additional history.   toradol iv.  Recheck pt comfortable.  ua w sm le, many bact, 3-6 wbc - no cva tenderness, fever, or dysuria. Given ureteral stone, possible uti, will cx and rx.  Rocephin iv. Keflex rx for home.  Recheck pain resolved. Pt appears stable for d/c.  Has urologist w whom she can follow up closely.   Pt has ride, does not have to drive. Pain improved/resolved.     Suzi Roots, MD 03/13/13 [phone]

## 2013-03-13 NOTE — ED Notes (Signed)
POCT preg resulted neg.  

## 2013-03-15 LAB — URINE CULTURE
Colony Count: NO GROWTH
Culture: NO GROWTH

## 2013-03-22 ENCOUNTER — Telehealth: Payer: Self-pay | Admitting: Internal Medicine

## 2013-03-22 NOTE — Telephone Encounter (Addendum)
Pt request refill of amphetamine-dextroamphetamine (ADDERALL) 20 MG tablet  1/ bid  3 mo supply

## 2013-03-23 NOTE — Telephone Encounter (Signed)
Pt  had appt on 11/06/12 for fu on meds. Before that it was a year between visits.  Does she still need to come in?

## 2013-03-23 NOTE — Telephone Encounter (Signed)
Pt has made appt for 10/20 at 3:15pm. Thanks!

## 2013-03-23 NOTE — Telephone Encounter (Signed)
Pt was seen on 11/06/12 for allergic rhinitis.  Not for med check.  Last med check was in Oct of 2013.  Please make appt.  Thanks!

## 2013-03-23 NOTE — Telephone Encounter (Signed)
This patient needs an appointment before she can have refills.  Please make appt and let me know when she is coming.

## 2013-03-24 MED ORDER — AMPHETAMINE-DEXTROAMPHETAMINE 20 MG PO TABS: 20.0000 mg | ORAL_TABLET | Freq: Two times a day (BID) | ORAL | Status: DC

## 2013-03-24 NOTE — Telephone Encounter (Signed)
Left message on cell for the pt to return my call. 

## 2013-03-24 NOTE — Telephone Encounter (Signed)
Done for one month only  

## 2013-03-24 NOTE — Telephone Encounter (Signed)
Please read below.  Ok to give prescription?

## 2013-03-24 NOTE — Telephone Encounter (Signed)
Patient picked up at the front desk 

## 2013-04-19 ENCOUNTER — Encounter: Payer: Self-pay | Admitting: Internal Medicine

## 2013-04-19 ENCOUNTER — Encounter: Payer: PRIVATE HEALTH INSURANCE | Admitting: Internal Medicine

## 2013-04-19 NOTE — Progress Notes (Signed)
Document opened and reviewed for OV but appt  canceled same day .  

## 2013-04-22 ENCOUNTER — Telehealth: Payer: Self-pay | Admitting: Internal Medicine

## 2013-04-22 NOTE — Telephone Encounter (Signed)
Pt request refill of amphetamine-dextroamphetamine (ADDERALL) 20 MG tablet 3 mo supply/ 1 bid

## 2013-04-26 ENCOUNTER — Other Ambulatory Visit: Payer: Self-pay | Admitting: Family Medicine

## 2013-04-26 MED ORDER — AMPHETAMINE-DEXTROAMPHET ER 20 MG PO CP24: 20.0000 mg | ORAL_CAPSULE | Freq: Two times a day (BID) | ORAL | Status: DC

## 2013-04-26 MED ORDER — AMPHETAMINE-DEXTROAMPHETAMINE 20 MG PO TABS: 20.0000 mg | ORAL_TABLET | Freq: Two times a day (BID) | ORAL | Status: DC

## 2013-04-26 NOTE — Telephone Encounter (Signed)
Patient notified to pick up at the front desk. 

## 2013-05-06 ENCOUNTER — Other Ambulatory Visit: Payer: Self-pay

## 2013-05-14 ENCOUNTER — Encounter: Payer: Self-pay | Admitting: *Deleted

## 2013-05-17 ENCOUNTER — Encounter: Payer: Self-pay | Admitting: Internal Medicine

## 2013-05-17 ENCOUNTER — Ambulatory Visit (INDEPENDENT_AMBULATORY_CARE_PROVIDER_SITE_OTHER): Payer: PRIVATE HEALTH INSURANCE | Admitting: Internal Medicine

## 2013-05-17 VITALS — BP 100/80 | HR 76 | Temp 98.0°F | Wt 167.0 lb

## 2013-05-17 DIAGNOSIS — R079 Chest pain, unspecified: Secondary | ICD-10-CM | POA: Insufficient documentation

## 2013-05-17 DIAGNOSIS — J45990 Exercise induced bronchospasm: Secondary | ICD-10-CM

## 2013-05-17 DIAGNOSIS — F909 Attention-deficit hyperactivity disorder, unspecified type: Secondary | ICD-10-CM

## 2013-05-17 MED ORDER — ALBUTEROL SULFATE HFA 108 (90 BASE) MCG/ACT IN AERS: 2.0000 | INHALATION_SPRAY | Freq: Four times a day (QID) | RESPIRATORY_TRACT | Status: DC | PRN

## 2013-05-17 MED ORDER — AMPHETAMINE-DEXTROAMPHETAMINE 20 MG PO TABS: 20.0000 mg | ORAL_TABLET | Freq: Two times a day (BID) | ORAL | Status: DC

## 2013-05-17 NOTE — Progress Notes (Signed)
Chief Complaint  Patient presents with  . Follow-up  . ADHD    HPI: due for med evaluation check Update:   neuro  Sees her every 4 months.get nerve test  Ab out 2 x per year.  And migraines   Topamax. Marland Kitchen HAs  Not as much  On increased  Topamax.    Chest tightness midline Worse after running or work out and chest sx  Like eia.   Remote hx of  Inhaler with allergies. Cough yesterday.   Gets sx about total  Gets sx about a mile into it and can  2 miles   Tends to work through it but sometimes stops no ass sx except sob with exercise   Chest feels tight and   No cough.otherwise   Stress recently and planning wedding.  Fiance lost job.  But doing ok   ROS: See pertinent positives and negatives per HPI. No depression Past Medical History  Diagnosis Date  . Allergic rhinitis, cause unspecified   . Anal fissure   . ADHD (attention deficit hyperactivity disorder)   . Low back pain   . Atypical chest pain   . Anemia   . Myalgia and myositis, unspecified   . Headache(784.0)   . Mononeuritis of lower limb, unspecified   . Screening examination for pulmonary tuberculosis   . Urinary tract infection, site not specified   . IBS (irritable bowel syndrome)   . History of Clostridium difficile infection     2007  . PVC (premature ventricular contraction)     had negative ECHO  . Hx of varicella   . Kidney stones   . Varicose veins     ablation  . H/O headache following lumbar puncture     done to R/O MS    Family History  Problem Relation Age of Onset  . Hypertension Mother   . Hyperlipidemia Father   . Asthma Father     History   Social History  . Marital Status: Single    Spouse Name: N/A    Number of Children: N/A  . Years of Education: N/A   Social History Main Topics  . Smoking status: Never Smoker   . Smokeless tobacco: None  . Alcohol Use: No  . Drug Use: No  . Sexual Activity: None   Other Topics Concern  . None   Social History Narrative   Single   graduated [employer].   Living apt with roommate   2 hh  [name]    Pet  2 dogs and cat exposure   ? 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 amphetamine-dextroamphetamine (ADDERALL) 20 MG tablet Take 1 tablet (20 mg total) by mouth 2 (two) times daily. Use as needed for school purposes  . etonogestrel-ethinyl estradiol (NUVARING) 0.12-0.015 MG/24HR vaginal ring Place 1 each vaginally every 28 (twenty-eight) days. Insert vaginally and leave in place for 3 consecutive weeks, then remove for 1 week.  . fluticasone (FLONASE) 50 MCG/ACT nasal spray Place 2 sprays into the nose daily.  Marland Kitchen gabapentin (NEURONTIN) 800 MG tablet Take 4,000 mg by mouth as directed. 1 morning, 1 afternoon and 3 at bedtime  . HYDROcodone-acetaminophen (NORCO)  7.5-325 MG per tablet Take 1 tablet by mouth every 6 (six) hours as needed for pain.  . Magnesium 100 MG TABS Take 1 tablet by mouth daily.  . montelukast (SINGULAIR) 10 MG tablet Take 1 tablet (10 mg total) by mouth at bedtime.  . orphenadrine (NORFLEX) 100 MG tablet Take 100 mg by mouth 2 (two) times daily.  . rizatriptan (MAXALT) 10 MG tablet Take 10 mg by mouth as needed for migraine. May repeat in 2 hours if needed  . topiramate (TOPAMAX) 100 MG tablet Take 100 mg by mouth 2 (two) times daily.  . [DISCONTINUED] amphetamine-dextroamphetamine (ADDERALL) 20 MG tablet Take 1 tablet (20 mg total) by mouth 2 (two) times daily. Use as needed for school purposes  . albuterol (PROVENTIL HFA;VENTOLIN HFA) 108 (90 BASE) MCG/ACT inhaler Inhale 2 puffs into the lungs every 6 (six) hours as needed (pre exercise or as needed for wheezing).  . ondansetron (ZOFRAN) 8 MG tablet Take 1 tablet (8 mg total) by mouth every  8 (eight) hours as needed for nausea.  . [DISCONTINUED] cephALEXin (KEFLEX) 500 MG capsule Take 1 capsule (500 mg total) by mouth 4 (four) times daily.  . [DISCONTINUED] oxyCODONE-acetaminophen (PERCOCET/ROXICET) 5-325 MG per tablet Take 1-2 tablets by mouth every 6 (six) hours as needed for pain.    EXAM:  BP 100/80  Pulse 76  Temp(Src) 98 F (36.7 C) (Oral)  Wt 167 lb (75.751 kg)  SpO2 99%  LMP 05/01/2013  Body mass index is 25.4 kg/(m^2).  GENERAL: vitals reviewed and listed above, alert, oriented, appears well hydrated and in no acute distress no toxic HEENT: atraumatic, conjunctiva  clear, no obvious abnormalities on inspection of external nose and ears OP : no lesion edema or exudate  NECK: no adenopathy  Or jvd  LUNGS: clear to auscultation bilaterally, no wheezes, rales or rhonchi, good air movement CV: HRRR, no clubbing cyanosis or  peripheral edema nl cap refill  Abdomen:  Sof,t normal bowel sounds without hepatosplenomegaly, no guarding rebound or masses no CVA tenderness MS: moves all extremities without noticeable focal  abnormality PSYCH: pleasant and cooperative, no obvious depression or anxiety EKG sinus  Rate 55  prolongtd qrs .11 no acute findings  ASSESSMENT AND PLAN:  Discussed the following assessment and plan:  ADHD (attention deficit hyperactivity disorder) - on meds for a while  no obv se     Chest pain, exertional - poss pulmonary etc  ekg  pfts .  consdier ETT if appropriate  but seems pulm   she is on high risk medication but can run a mile bfore has issues and hx of all - Plan: EKG 12-Lead, Pulmonary function test  EXERCISE INDUCED ASTHMA ? - hx seasonal allergy no cough  - Plan: Pulmonary function test Refill med rov in 2 months or so about how this is working and pfts in meantime -Patient advised to return or notify health care team  if symptoms worsen or persist or new concerns arise.  Patient Instructions  Chest discomfort may well be  Exercise   Asthma sx .  Can try inhaler and fu  Advise  Lung function tests in the meantime and consider getting exercise treadmill test.    Neta Mends. Panosh M.D.   UTD on HCM pap etc

## 2013-05-17 NOTE — Patient Instructions (Signed)
Chest discomfort may well be  Exercise  Asthma sx .  Can try inhaler and fu  Advise  Lung function tests in the meantime and consider getting exercise treadmill test.

## 2013-07-12 ENCOUNTER — Ambulatory Visit: Payer: PRIVATE HEALTH INSURANCE | Admitting: Internal Medicine

## 2013-08-10 ENCOUNTER — Ambulatory Visit (INDEPENDENT_AMBULATORY_CARE_PROVIDER_SITE_OTHER): Payer: PRIVATE HEALTH INSURANCE | Admitting: Internal Medicine

## 2013-08-10 ENCOUNTER — Encounter: Payer: Self-pay | Admitting: Internal Medicine

## 2013-08-10 VITALS — BP 110/80 | HR 72 | Temp 98.1°F | Ht 67.5 in | Wt 158.0 lb

## 2013-08-10 DIAGNOSIS — F4322 Adjustment disorder with anxiety: Secondary | ICD-10-CM

## 2013-08-10 DIAGNOSIS — Z79899 Other long term (current) drug therapy: Secondary | ICD-10-CM

## 2013-08-10 DIAGNOSIS — Z566 Other physical and mental strain related to work: Secondary | ICD-10-CM | POA: Insufficient documentation

## 2013-08-10 DIAGNOSIS — F909 Attention-deficit hyperactivity disorder, unspecified type: Secondary | ICD-10-CM

## 2013-08-10 DIAGNOSIS — Z569 Unspecified problems related to employment: Secondary | ICD-10-CM

## 2013-08-10 HISTORY — DX: Adjustment disorder with anxiety: F43.22

## 2013-08-10 MED ORDER — LORAZEPAM 0.5 MG PO TABS: 0.5000 mg | ORAL_TABLET | Freq: Three times a day (TID) | ORAL | Status: DC | PRN

## 2013-08-10 MED ORDER — ESCITALOPRAM OXALATE 10 MG PO TABS: 5.0000 mg | ORAL_TABLET | Freq: Every day | ORAL | Status: DC

## 2013-08-10 NOTE — Progress Notes (Signed)
Chief Complaint  Patient presents with  . Anxiety    Recent death in the family.  She says her job is very stressful.  Is planning a wedding and is in a wedding.  Is looking for something to help her cope.    HPI: Patient comes in today for SDA for  new problem evaluation. Very anxious and stressed some chest sx also new supervisor at work and very difficult .  Still on add med but never had this before the situation doesn't feel depressed . Looking for new job  t ohelp. No change in meds per se .  Hx cymbaltatain the past and didn't feel good on it.  ROS: See pertinent positives and negatives per HPI.  Past Medical History  Diagnosis Date  . Allergic rhinitis, cause unspecified   . Anal fissure   . ADHD (attention deficit hyperactivity disorder)   . Low back pain   . Atypical chest pain   . Anemia   . Myalgia and myositis, unspecified   . Headache(784.0)   . Mononeuritis of lower limb, unspecified   . Screening examination for pulmonary tuberculosis   . Urinary tract infection, site not specified   . IBS (irritable bowel syndrome)   . History of Clostridium difficile infection     2007  . PVC (premature ventricular contraction)     had negative ECHO  . Hx of varicella   . Kidney stones   . Varicose veins     ablation  . H/O headache following lumbar puncture     done to R/O MS    Family History  Problem Relation Age of Onset  . Hypertension Mother   . Hyperlipidemia Father   . Asthma Father     History   Social History  . Marital Status: Single    Spouse Name: N/A    Number of Children: N/A  . Years of Education: N/A   Social History Main Topics  . Smoking status: Never Smoker   . Smokeless tobacco: None  . Alcohol Use: No  . Drug Use: No  . Sexual Activity: None   Other Topics Concern  . None   Social History Narrative   Single    graduated [employer].   Living apt with roommate   2 hh  [name]    Pet  2 dogs and cat exposure   ?  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 albuterol (PROVENTIL HFA;VENTOLIN HFA) 108 (90 BASE) MCG/ACT inhaler Inhale 2 puffs into the lungs every 6 (six) hours as needed (pre exercise or as needed for wheezing).  Marland Kitchen amphetamine-dextroamphetamine (ADDERALL) 20 MG tablet Take 1 tablet (20 mg total) by mouth 2 (two) times daily. Use as needed for school purposes  . etonogestrel-ethinyl estradiol (NUVARING) 0.12-0.015 MG/24HR vaginal ring Place 1 each vaginally every 28 (twenty-eight) days. Insert vaginally and leave in place for 3 consecutive weeks, then remove for 1 week.  . fluticasone (FLONASE) 50 MCG/ACT nasal spray Place 2 sprays into the nose daily.  Marland Kitchen gabapentin (NEURONTIN) 800 MG tablet Take 4,000 mg by mouth as directed. 1 morning, 1 afternoon and 3 at bedtime  . HYDROcodone-acetaminophen (NORCO)  7.5-325 MG per tablet Take 1 tablet by mouth every 6 (six) hours as needed for pain.  . Magnesium 100 MG TABS Take 1 tablet by mouth daily.  . montelukast (SINGULAIR) 10 MG tablet Take 1 tablet (10 mg total) by mouth at bedtime.  . orphenadrine (NORFLEX) 100 MG tablet Take 100 mg by mouth 2 (two) times daily.  . rizatriptan (MAXALT) 10 MG tablet Take 10 mg by mouth as needed for migraine. May repeat in 2 hours if needed  . topiramate (TOPAMAX) 100 MG tablet Take 100 mg by mouth 2 (two) times daily.  Marland Kitchen. escitalopram (LEXAPRO) 10 MG tablet Take 0.5-1 tablets (5-10 mg total) by mouth daily. Can increase to 1 qd  . LORazepam (ATIVAN) 0.5 MG tablet Take 1-2 tablets (0.5-1 mg total) by mouth every 8 (eight) hours as needed for anxiety.  . ondansetron (ZOFRAN) 8 MG tablet Take 1 tablet (8 mg total) by mouth every 8 (eight) hours as needed for nausea.     EXAM:  BP 110/80  Pulse 72  Temp(Src) 98.1 F (36.7 C) (Oral)  Ht 5' 7.5" (1.715 m)  Wt 158 lb (71.668 kg)  BMI 24.37 kg/m2  SpO2 99%  Body mass index is 24.37 kg/(m^2).  GENERAL: vitals reviewed and listed above, alert, oriented, appears well hydrated and in no acute distress Some stress    MS: moves all extremities without noticeable focal  abnormality PSYCH: oriented  cooperative,  cognition and speech intact looks a bit stressed nl body motor activity   ASSESSMENT AND PLAN:  Discussed the following assessment and plan:  Adjustment disorder with anxiety - disc controller med and benzo prn avoid dependence  and se cuation try low dose lexapro and fu   Stressful job  ADHD (attention deficit hyperactivity disorder)  High risk medications (not anticoagulants) long-term use Is on many meds   Some high risk  Care with se poss . Controller med and benzo not a long term treatment . Pt aware  Try ativan and fu 3-4 weeks or as needed. Other med options discussed didn't do well on cymb alta  -Patient advised to return or notify health care team  if symptoms worsen or persist or new concerns arise.  Patient Instructions  contgroller medication trial  And  Can add on benzo as needed but caution  Do not take with pain meds and   Can impaoir driving .  ROV in 3-4 weeks or as needed to see respoknse Begin 5 mg per day and after 2 weeks can increase to 10 mg per day .    Neta MendsWanda K. Panosh M.D.  Pre visit review using our clinic review tool, if applicable. No additional management support is needed unless otherwise documented below in the visit note. Total visit 25mins > 50% spent counseling and coordinating care

## 2013-08-10 NOTE — Patient Instructions (Addendum)
contgroller medication trial  And  Can add on benzo as needed but caution  Do not take with pain meds and   Can impaoir driving .  ROV in 3-4 weeks or as needed to see respoknse Begin 5 mg per day and after 2 weeks can increase to 10 mg per day .

## 2013-08-27 ENCOUNTER — Telehealth: Payer: Self-pay | Admitting: Internal Medicine

## 2013-08-27 NOTE — Telephone Encounter (Signed)
Pt needs new rx generic adderall 20 mg #180 °

## 2013-08-30 MED ORDER — AMPHETAMINE-DEXTROAMPHETAMINE 20 MG PO TABS: 20.0000 mg | ORAL_TABLET | Freq: Two times a day (BID) | ORAL | Status: DC

## 2013-08-30 NOTE — Telephone Encounter (Signed)
Left message at the below listed number for the pt to return my call. 

## 2013-08-30 NOTE — Telephone Encounter (Signed)
Pt notified by front desk staff to pick up rx.

## 2013-08-31 ENCOUNTER — Encounter: Payer: Self-pay | Admitting: Internal Medicine

## 2013-08-31 ENCOUNTER — Ambulatory Visit (INDEPENDENT_AMBULATORY_CARE_PROVIDER_SITE_OTHER): Payer: PRIVATE HEALTH INSURANCE | Admitting: Internal Medicine

## 2013-08-31 VITALS — BP 100/76 | Temp 97.8°F | Ht 67.5 in | Wt 158.0 lb

## 2013-08-31 DIAGNOSIS — Z569 Unspecified problems related to employment: Secondary | ICD-10-CM

## 2013-08-31 DIAGNOSIS — Z566 Other physical and mental strain related to work: Secondary | ICD-10-CM

## 2013-08-31 DIAGNOSIS — T887XXA Unspecified adverse effect of drug or medicament, initial encounter: Secondary | ICD-10-CM

## 2013-08-31 DIAGNOSIS — F4322 Adjustment disorder with anxiety: Secondary | ICD-10-CM

## 2013-08-31 MED ORDER — SERTRALINE HCL 50 MG PO TABS: 25.0000 mg | ORAL_TABLET | Freq: Every day | ORAL | Status: DC

## 2013-08-31 NOTE — Progress Notes (Signed)
Chief Complaint  Patient presents with  . Follow-up    medcaition    HPI: Fu medication   lexapro gave her migraines  tried it at least About 3 -4 days and . Last migraine . None since .didnt take the maxalt . 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 No new symptoms otherwise. ROS: See pertinent positives and negatives per HPI.  Past Medical History  Diagnosis Date  . Allergic rhinitis, cause unspecified   . Anal fissure   . ADHD (attention deficit hyperactivity disorder)   . Low back pain   . Atypical chest pain   . Anemia   . Myalgia and myositis, unspecified   . Headache(784.0)   . Mononeuritis of lower limb, unspecified   . Screening examination for pulmonary tuberculosis   . Urinary tract infection, site not specified   . IBS (irritable bowel syndrome)   . History of Clostridium difficile infection     2007  . PVC (premature ventricular contraction)     had negative ECHO  . Hx of varicella   . Kidney stones   . Varicose veins     ablation  . H/O headache following lumbar puncture     done to R/O MS    Family History  Problem Relation Age of Onset  . Hypertension Mother   . Hyperlipidemia Father   . Asthma Father     History   Social History  . Marital Status: Single    Spouse Name: N/A    Number of Children: N/A  . Years of Education: N/A   Social History Main Topics  . Smoking status: Never Smoker   . Smokeless tobacco: None  . Alcohol Use: No  . Drug Use: No  . Sexual Activity: None   Other Topics Concern  . None   Social History Narrative   Single    graduated [employer].   Living apt with roommate   2 hh  Jacquenette ShoneJulian    Pet  2 dogs and cat exposure   ? 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 albuterol (PROVENTIL HFA;VENTOLIN HFA) 108 (90 BASE) MCG/ACT inhaler Inhale 2 puffs into the lungs every 6 (six) hours as needed (pre exercise or as needed for wheezing).  Marland Kitchen. amphetamine-dextroamphetamine (ADDERALL) 20 MG tablet Take 1 tablet (20 mg total) by mouth 2 (two) times daily. Use as needed for school purposes  . etonogestrel-ethinyl estradiol (NUVARING) 0.12-0.015 MG/24HR vaginal ring Place 1 each vaginally every 28 (twenty-eight) days. Insert vaginally and leave in place for 3 consecutive weeks, then remove for 1 week.  . fluticasone (FLONASE) 50 MCG/ACT nasal spray Place 2 sprays into the nose daily.  Marland Kitchen. gabapentin (NEURONTIN) 800 MG tablet Take 4,000 mg by mouth as directed. 1 morning, 1 afternoon and 3 at bedtime  . HYDROcodone-acetaminophen (NORCO) 7.5-325 MG per tablet Take 1 tablet by mouth every 6 (six) hours as needed for pain.  Marland Kitchen. LORazepam (ATIVAN) 0.5 MG tablet Take 1-2 tablets (0.5-1 mg total)  by mouth every 8 (eight) hours as needed for anxiety.  . Magnesium 100 MG TABS Take 1 tablet by mouth daily.  . montelukast (SINGULAIR) 10 MG tablet Take 1 tablet (10 mg total) by mouth at bedtime.  . ondansetron (ZOFRAN) 8 MG tablet Take 1 tablet (8 mg total) by mouth every 8 (eight) hours as needed for nausea.  . orphenadrine (NORFLEX) 100 MG tablet Take 100 mg by mouth 2 (two) times daily.  . rizatriptan (MAXALT) 10 MG tablet Take 10 mg by mouth as needed for migraine. May repeat in 2 hours if needed  . topiramate (TOPAMAX) 100 MG tablet Take 100 mg by mouth 2 (two) times daily.  . sertraline (ZOLOFT) 50 MG tablet Take 0.5-1 tablets (25-50 mg total) by mouth daily.  . [DISCONTINUED] escitalopram (LEXAPRO) 10 MG tablet Take 0.5-1 tablets (5-10 mg total) by mouth daily. Can increase to 1 qd    EXAM:  BP 100/76   Temp(Src) 97.8 F (36.6 C) (Oral)  Ht 5' 7.5" (1.715 m)  Wt 158 lb (71.668 kg)  BMI 24.37 kg/m2  Body mass index is 24.37 kg/(m^2).  GENERAL: vitals reviewed and listed above, alert, oriented, appears well hydrated and in no acute distress good eye contact slightly down intimated normal speech  ASSESSMENT AND PLAN:  Discussed the following assessment and plan:  Medication side effect - Aggravated migraines with Lexapro  Adjustment disorder with anxiety - Discussed other options could cause a problem or not we'll try low dose sertraline at this time ;contact us in about 3 weeks to assess medRoV i2 months  Stressful job  -Patient advised to return or notify health care team  if symptoms worsen or persist or new concerns arise.  Patient Instructions  Try low dose sertraline instead  .25 mg and then increase if needed .  Contact us by my chart   In about 3 weeks or so about medication ROV in about 2 months otherwise .     Neta Mends. Riaz Onorato M.D.  Pre visit review using our clinic review tool, if applicable. No additional management support is needed unless otherwise documented below in the visit note.

## 2013-08-31 NOTE — Patient Instructions (Signed)
Try low dose sertraline instead  .25 mg and then increase if needed .  Contact us by my chart   In about 3 weeks or so about medication ROV in about 2 months otherwise .

## 2013-09-01 ENCOUNTER — Encounter: Payer: Self-pay | Admitting: Internal Medicine

## 2013-09-01 DIAGNOSIS — Z79899 Other long term (current) drug therapy: Secondary | ICD-10-CM | POA: Insufficient documentation

## 2013-09-23 ENCOUNTER — Encounter: Payer: Self-pay | Admitting: Internal Medicine

## 2013-11-01 ENCOUNTER — Encounter: Payer: Self-pay | Admitting: Internal Medicine

## 2013-11-01 ENCOUNTER — Encounter: Payer: PRIVATE HEALTH INSURANCE | Admitting: Internal Medicine

## 2013-11-01 NOTE — Progress Notes (Signed)
Document opened and reviewed for OV but appt  canceled same day .family emergency

## 2013-11-10 ENCOUNTER — Other Ambulatory Visit: Payer: Self-pay | Admitting: Internal Medicine

## 2013-12-01 ENCOUNTER — Telehealth: Payer: Self-pay | Admitting: Internal Medicine

## 2013-12-01 MED ORDER — AMPHETAMINE-DEXTROAMPHETAMINE 20 MG PO TABS: 20.0000 mg | ORAL_TABLET | Freq: Two times a day (BID) | ORAL | Status: DC

## 2013-12-01 NOTE — Telephone Encounter (Signed)
Patient notified to pick up at the front desk.  Will need a follow up before this medication runs out.

## 2013-12-01 NOTE — Telephone Encounter (Signed)
Pt needs new rx  generic adderall 20 mg #180 for  90 day supply

## 2013-12-03 ENCOUNTER — Other Ambulatory Visit: Payer: Self-pay | Admitting: Family Medicine

## 2013-12-03 MED ORDER — AMPHETAMINE-DEXTROAMPHETAMINE 20 MG PO TABS: 20.0000 mg | ORAL_TABLET | Freq: Two times a day (BID) | ORAL | Status: DC

## 2013-12-03 MED ORDER — AMPHETAMINE-DEXTROAMPHET ER 20 MG PO CP24: 20.0000 mg | ORAL_CAPSULE | Freq: Two times a day (BID) | ORAL | Status: DC

## 2013-12-18 ENCOUNTER — Encounter: Payer: Self-pay | Admitting: Family Medicine

## 2013-12-18 ENCOUNTER — Ambulatory Visit (INDEPENDENT_AMBULATORY_CARE_PROVIDER_SITE_OTHER): Payer: PRIVATE HEALTH INSURANCE | Admitting: Family Medicine

## 2013-12-18 VITALS — BP 102/72 | HR 76 | Temp 97.9°F | Wt 155.0 lb

## 2013-12-18 DIAGNOSIS — J329 Chronic sinusitis, unspecified: Secondary | ICD-10-CM

## 2013-12-18 MED ORDER — AMOXICILLIN-POT CLAVULANATE 875-125 MG PO TABS: 1.0000 | ORAL_TABLET | Freq: Two times a day (BID) | ORAL | Status: DC

## 2013-12-18 NOTE — Progress Notes (Signed)
SUBJECTIVE:  Alexandra [name] is a 28 y.o. female who complains of congestion, sore throat, nasal blockage and ear pain for 7 days. She denies a history of chest pain, dizziness, myalgias, shortness of breath, vomiting, weakness and weight loss and denies a history of asthma. Patient denies smoke cigarettes.   OBJECTIVE: Blood pressure 102/72, pulse 76, temperature 97.9 F (36.6 C), temperature source Oral, weight 155 lb (70.308 kg), SpO2 99.00%.  General appearance: alert, cooperative and appears stated age Head: Normocephalic, without obvious abnormality, atraumatic sinus maxillary on left tender Eyes: conjunctivae/corneas clear. PERRL, EOM's intact. Fundi benign. Ears: abnormal TM left ear - diminished mobility, dull and retracted Nose: Nares normal. Septum midline. Mucosa normal. No drainage or sinus tenderness. nasal polyp noted.  Throat: lips, mucosa, and tongue normal; teeth and gums normal Neck: mild anterior cervical adenopathy, no carotid bruit, no JVD, supple, symmetrical, trachea midline and thyroid not enlarged, symmetric, no tenderness/mass/nodules Lungs: clear to auscultation bilaterally Heart: regular rate and rhythm, S1, S2 normal, no murmur, click, rub or gallop Abdomen: soft, non-tender; bowel sounds normal; no masses,  no organomegaly Extremities: extremities normal, atraumatic, no cyanosis or edema Pulses: 2+ and symmetric Skin: Skin color, texture, turgor normal. No rashes or lesions Lymph nodes: Cervical adenopathy: anterioro on right Neurologic: Alert and oriented X 3, normal strength and tone. Normal symmetric reflexes. Normal coordination and gait   ASSESSMENT:  sinusitis  PLAN: Symptomatic therapy suggested: push fluids, rest and return office visit prn if symptoms persist or worsen. Abx per orders.   her. Call or return to clinic prn if these symptoms worsen or fail to improve as anticipated.

## 2013-12-18 NOTE — Patient Instructions (Signed)
Good to meet you Augmentin daily for 10 days then as needed Come back in 2 weeks if not better you may need a nose spray.  Sinusitis Sinusitis is redness, soreness, and swelling (inflammation) of the paranasal sinuses. Paranasal sinuses are air pockets within the bones of your face (beneath the eyes, the middle of the forehead, or above the eyes). In healthy paranasal sinuses, mucus is able to drain out, and air is able to circulate through them by way of your nose. However, when your paranasal sinuses are inflamed, mucus and air can become trapped. This can allow bacteria and other germs to grow and cause infection. Sinusitis can develop quickly and last only a short time (acute) or continue over a long period (chronic). Sinusitis that lasts for more than 12 weeks is considered chronic.  CAUSES  Causes of sinusitis include:  Allergies.  Structural abnormalities, such as displacement of the cartilage that separates your nostrils (deviated septum), which can decrease the air flow through your nose and sinuses and affect sinus drainage.  Functional abnormalities, such as when the small hairs (cilia) that line your sinuses and help remove mucus do not work properly or are not present. SYMPTOMS  Symptoms of acute and chronic sinusitis are the same. The primary symptoms are pain and pressure around the affected sinuses. Other symptoms include:  Upper toothache.  Earache.  Headache.  Bad breath.  Decreased sense of smell and taste.  A cough, which worsens when you are lying flat.  Fatigue.  Fever.  Thick drainage from your nose, which often is green and may contain pus (purulent).  Swelling and warmth over the affected sinuses. DIAGNOSIS  Your caregiver will perform a physical exam. During the exam, your caregiver may:  Look in your nose for signs of abnormal growths in your nostrils (nasal polyps).  Tap over the affected sinus to check for signs of infection.  View the inside of  your sinuses (endoscopy) with a special imaging device with a light attached (endoscope), which is inserted into your sinuses. If your caregiver suspects that you have chronic sinusitis, one or more of the following tests may be recommended:  Allergy tests.  Nasal culture--A sample of mucus is taken from your nose and sent to a lab and screened for bacteria.  Nasal cytology--A sample of mucus is taken from your nose and examined by your caregiver to determine if your sinusitis is related to an allergy. TREATMENT  Most cases of acute sinusitis are related to a viral infection and will resolve on their own within 10 days. Sometimes medicines are prescribed to help relieve symptoms (pain medicine, decongestants, nasal steroid sprays, or saline sprays).  However, for sinusitis related to a bacterial infection, your caregiver will prescribe antibiotic medicines. These are medicines that will help kill the bacteria causing the infection.  Rarely, sinusitis is caused by a fungal infection. In theses cases, your caregiver will prescribe antifungal medicine. For some cases of chronic sinusitis, surgery is needed. Generally, these are cases in which sinusitis recurs more than 3 times per year, despite other treatments. HOME CARE INSTRUCTIONS   Drink plenty of water. Water helps thin the mucus so your sinuses can drain more easily.  Use a humidifier.  Inhale steam 3 to 4 times a day (for example, sit in the bathroom with the shower running).  Apply a warm, moist washcloth to your face 3 to 4 times a day, or as directed by your caregiver.  Use saline nasal sprays to help  moisten and clean your sinuses.  Take over-the-counter or prescription medicines for pain, discomfort, or fever only as directed by your caregiver. SEEK IMMEDIATE MEDICAL CARE IF:  You have increasing pain or severe headaches.  You have nausea, vomiting, or drowsiness.  You have swelling around your face.  You have vision  problems.  You have a stiff neck.  You have difficulty breathing. MAKE SURE YOU:   Understand these instructions.  Will watch your condition.  Will get help right away if you are not doing well or get worse. Document Released: 06/17/2005 Document Revised: 09/09/2011 Document Reviewed: 07/02/2011 [facility] Patient Information 2015 [place] [place] [place]. This information is not intended to replace advice given to you by your health care Elan Mcelvain. Make sure you discuss any questions you have with your health care Abby Tucholski.

## 2013-12-19 ENCOUNTER — Telehealth: Payer: Self-pay | Admitting: Internal Medicine

## 2013-12-19 ENCOUNTER — Emergency Department (HOSPITAL_COMMUNITY)
Admission: EM | Admit: 2013-12-19 | Discharge: 2013-12-19 | Disposition: A | Discharge status: Home / Self Care | Payer: PRIVATE HEALTH INSURANCE | Attending: Emergency Medicine | Admitting: Emergency Medicine

## 2013-12-19 ENCOUNTER — Encounter (HOSPITAL_COMMUNITY): Payer: Self-pay | Admitting: Emergency Medicine

## 2013-12-19 DIAGNOSIS — H748X9 Other specified disorders of middle ear and mastoid, unspecified ear: Secondary | ICD-10-CM | POA: Insufficient documentation

## 2013-12-19 DIAGNOSIS — Z862 Personal history of diseases of the blood and blood-forming organs and certain disorders involving the immune mechanism: Secondary | ICD-10-CM | POA: Insufficient documentation

## 2013-12-19 DIAGNOSIS — Z87442 Personal history of urinary calculi: Secondary | ICD-10-CM | POA: Insufficient documentation

## 2013-12-19 DIAGNOSIS — Z8619 Personal history of other infectious and parasitic diseases: Secondary | ICD-10-CM | POA: Insufficient documentation

## 2013-12-19 DIAGNOSIS — Z8719 Personal history of other diseases of the digestive system: Secondary | ICD-10-CM | POA: Insufficient documentation

## 2013-12-19 DIAGNOSIS — Z87440 Personal history of urinary (tract) infections: Secondary | ICD-10-CM | POA: Insufficient documentation

## 2013-12-19 DIAGNOSIS — Z79899 Other long term (current) drug therapy: Secondary | ICD-10-CM | POA: Insufficient documentation

## 2013-12-19 DIAGNOSIS — IMO0002 Reserved for concepts with insufficient information to code with codable children: Secondary | ICD-10-CM | POA: Insufficient documentation

## 2013-12-19 DIAGNOSIS — F909 Attention-deficit hyperactivity disorder, unspecified type: Secondary | ICD-10-CM | POA: Insufficient documentation

## 2013-12-19 DIAGNOSIS — J029 Acute pharyngitis, unspecified: Secondary | ICD-10-CM | POA: Insufficient documentation

## 2013-12-19 DIAGNOSIS — R52 Pain, unspecified: Secondary | ICD-10-CM | POA: Insufficient documentation

## 2013-12-19 DIAGNOSIS — H9202 Otalgia, left ear: Secondary | ICD-10-CM

## 2013-12-19 DIAGNOSIS — Z792 Long term (current) use of antibiotics: Secondary | ICD-10-CM | POA: Insufficient documentation

## 2013-12-19 DIAGNOSIS — H9209 Otalgia, unspecified ear: Secondary | ICD-10-CM | POA: Insufficient documentation

## 2013-12-19 LAB — RAPID STREP SCREEN (MED CTR MEBANE ONLY): Streptococcus, Group A Screen (Direct): NEGATIVE

## 2013-12-19 MED ORDER — HYDROCODONE-ACETAMINOPHEN 5-325 MG PO TABS
2.0000 | ORAL_TABLET | Freq: Once | ORAL | Status: AC
  Administered 2013-12-19: 2 via ORAL
  Filled 2013-12-19: qty 2

## 2013-12-19 MED ORDER — HYDROCODONE-ACETAMINOPHEN 5-325 MG PO TABS: 1.0000 | ORAL_TABLET | Freq: Four times a day (QID) | ORAL | Status: DC | PRN

## 2013-12-19 MED ORDER — ANTIPYRINE-BENZOCAINE 5.4-1.4 % OT SOLN: 3.0000 [drp] | OTIC | Status: DC | PRN

## 2013-12-19 MED ORDER — ANTIPYRINE-BENZOCAINE 5.4-1.4 % OT SOLN
3.0000 [drp] | Freq: Once | OTIC | Status: AC
  Administered 2013-12-19: 4 [drp] via OTIC
  Filled 2013-12-19: qty 10

## 2013-12-19 NOTE — ED Provider Notes (Signed)
CSN: [id number]     Arrival date & time 12/19/13  1425 History   First MD Initiated Contact with Patient 12/19/13 1459     Chief Complaint  Patient presents with  . Otalgia    x 4 days     (Consider location/radiation/quality/duration/timing/severity/associated sxs/prior Treatment) HPI Comments: Saw PCP yesterday for this - put on Augmentin for sinusitis.   Patient is a 28 y.o. female presenting with ear pain. The history is provided by the patient.  Otalgia Location:  Left Behind ear:  Redness Quality:  Aching Severity:  Moderate Onset quality:  Gradual Timing:  Constant Progression:  Unchanged Chronicity:  New Context: not direct blow, not elevation change, not foreign body in ear, not loud noise and no water in ear   Relieved by:  Nothing Worsened by:  Nothing tried Associated symptoms: congestion (L nasal) and sore throat   Associated symptoms: no cough, no ear discharge, no fever and no hearing loss     Past Medical History  Diagnosis Date  . Allergic rhinitis, cause unspecified   . Anal fissure   . ADHD (attention deficit hyperactivity disorder)   . Low back pain   . Atypical chest pain   . Anemia   . Myalgia and myositis, unspecified   . Headache(784.0)   . Mononeuritis of lower limb, unspecified   . Screening examination for pulmonary tuberculosis   . Urinary tract infection, site not specified   . IBS (irritable bowel syndrome)   . History of Clostridium difficile infection     2007  . PVC (premature ventricular contraction)     had negative ECHO  . Hx of varicella   . Kidney stones   . Varicose veins     ablation  . H/O headache following lumbar puncture     done to R/O MS   Past Surgical History  Procedure Laterality Date  . Breast surgery      augmentation  . Endovenous ablation saphenous vein w/ laser     Family History  Problem Relation Age of Onset  . Hypertension Mother   . Hyperlipidemia Father   . Asthma Father    History   Substance Use Topics  . Smoking status: Never Smoker   . Smokeless tobacco: Not on file  . Alcohol Use: No   OB History   Grav Para Term Preterm Abortions TAB SAB Ect Mult Living                 Review of Systems  Constitutional: Negative for fever and chills.  HENT: Positive for congestion (L nasal), ear pain, sinus pressure (L) and sore throat. Negative for dental problem, drooling, ear discharge, facial swelling and hearing loss.   Respiratory: Negative for cough and shortness of breath.   All other systems reviewed and are negative.     Allergies  Lexapro; Septra; and Sulfa antibiotics  Home Medications   Prior to Admission medications   Medication Sig Start Date End Date Taking? Authorizing Provider  albuterol (PROVENTIL HFA;VENTOLIN HFA) 108 (90 BASE) MCG/ACT inhaler Inhale 2 puffs into the lungs every 6 (six) hours as needed (pre exercise or as needed for wheezing). 05/17/13   Madelin HeadingsWanda K Panosh, MD  amoxicillin-clavulanate (AUGMENTIN) 875-125 MG per tablet Take 1 tablet by mouth 2 (two) times daily. 12/18/13   Judi SaaZachary M Smith, DO  amphetamine-dextroamphetamine (ADDERALL XR) 20 MG 24 hr capsule Take 1 capsule (20 mg total) by mouth 2 (two) times daily. 12/03/13   Madelin HeadingsWanda K Panosh,  MD  amphetamine-dextroamphetamine (ADDERALL XR) 20 MG 24 hr capsule Take 1 capsule (20 mg total) by mouth 2 (two) times daily. 12/03/13   Madelin Headings, MD  amphetamine-dextroamphetamine (ADDERALL) 20 MG tablet Take 1 tablet (20 mg total) by mouth 2 (two) times daily. 12/03/13 12/03/14  Madelin Headings, MD  etonogestrel-ethinyl estradiol (NUVARING) 0.12-0.015 MG/24HR vaginal ring Place 1 each vaginally every 28 (twenty-eight) days. Insert vaginally and leave in place for 3 consecutive weeks, then remove for 1 week.    Oliver Pila, MD  fluticasone [facility]) 50 MCG/ACT nasal spray Place 2 sprays into the nose daily. 11/06/12   Madelin Headings, MD  gabapentin (NEURONTIN) 800 MG tablet Take 4,000 mg by mouth as  directed. 1 morning, 1 afternoon and 3 at bedtime    Aletha Halim, MD  HYDROcodone-acetaminophen (NORCO) 7.5-325 MG per tablet Take 1 tablet by mouth every 6 (six) hours as needed for pain.    Historical Provider, MD  LORazepam (ATIVAN) 0.5 MG tablet Take 1-2 tablets (0.5-1 mg total) by mouth every 8 (eight) hours as needed for anxiety. 08/10/13   Madelin Headings, MD  Magnesium 100 MG TABS Take 1 tablet by mouth daily.    Historical Provider, MD  montelukast (SINGULAIR) 10 MG tablet TAKE 1 TABLET BY MOUTH AT BEDTIME.    Madelin Headings, MD  ondansetron (ZOFRAN) 8 MG tablet Take 1 tablet (8 mg total) by mouth every 8 (eight) hours as needed for nausea. 03/13/13   Suzi Roots, MD  orphenadrine (NORFLEX) 100 MG tablet Take 100 mg by mouth 2 (two) times daily.    Historical Provider, MD  rizatriptan (MAXALT) 10 MG tablet Take 10 mg by mouth as needed for migraine. May repeat in 2 hours if needed    Aletha Halim, MD  sertraline (ZOLOFT) 50 MG tablet Take 0.5-1 tablets (25-50 mg total) by mouth daily. 08/31/13   Madelin Headings, MD  topiramate (TOPAMAX) 100 MG tablet Take 100 mg by mouth 2 (two) times daily.    Historical Provider, MD   BP 103/77  Pulse 84  Temp(Src) 98.2 F (36.8 C) (Oral)  Resp 18  SpO2 100%  LMP 12/09/2013 Physical Exam  Nursing note and vitals reviewed. Constitutional: She is oriented to person, place, and time. She appears well-developed and well-nourished. No distress.  HENT:  Head: Normocephalic and atraumatic.  Right Ear: Tympanic membrane normal.  Left Ear: Tympanic membrane is erythematous. Tympanic membrane is not retracted and not bulging. Tympanic membrane mobility is normal. A middle ear effusion (mild) is present.  Mouth/Throat: She does not have dentures. No oral lesions. No trismus in the jaw. Normal dentition. No dental abscesses, uvula swelling, lacerations or dental caries. Posterior oropharyngeal erythema (mild redness on L tonsil, no exudate) present. No  oropharyngeal exudate, posterior oropharyngeal edema or tonsillar abscesses.  Eyes: EOM are normal. Pupils are equal, round, and reactive to light.  Neck: Normal range of motion. Neck supple.  Cardiovascular: Normal rate and regular rhythm.  Exam reveals no friction rub.   No murmur heard. Pulmonary/Chest: Effort normal and breath sounds normal. No respiratory distress. She has no wheezes. She has no rales.  Abdominal: Soft. She exhibits no distension. There is no tenderness. There is no rebound.  Musculoskeletal: Normal range of motion. She exhibits no edema.  Neurological: She is alert and oriented to person, place, and time.  Skin: She is not diaphoretic.    ED Course  Procedures (including critical care time) Labs  Review Labs Reviewed  RAPID STREP SCREEN  CULTURE, GROUP A STREP    Imaging Review No results found.   EKG Interpretation None      MDM   Final diagnoses:  Acute otalgia, left    59F here with L otalgia. Seen yesterday by PCP, prescribed augmentin for L otitis. Has taken antibiotics, but still having ear pain. No fevers, no vomiting. Decreased PO due to L throat pain, pain with swallowing, difficulty opening mouth. AFVSS here. L TM red with mild effusion. Mild tragus tenderness, mild mastoid tenderness. No mastoid erythema, fluctuance. L throat with some redness, no exudate. No tonsillar swelilng, no concern for PTA or RPA. No uvular shift. Will screen for strep, give auralgan, pain meds. No concern for mastoiditis - no raging erythema, no fevers. Will instruct to continue antibiotics, give pain meds topically and systemically.    Dagmar HaitWilliam Blair Walden, MD 12/19/13 [phone]

## 2013-12-19 NOTE — ED Notes (Signed)
Pt reports l/ear pain x 4 days. Pain increased after treatment with antibiotics initiated

## 2013-12-19 NOTE — Discharge Instructions (Signed)
Otalgia  The most common reason for this in children is an infection of the middle ear. Pain from the middle ear is usually caused by a build-up of fluid and pressure behind the eardrum. Pain from an earache can be sharp, dull, or burning. The pain may be temporary or constant. The middle ear is connected to the nasal passages by a short narrow tube called the Eustachian tube. The Eustachian tube allows fluid to drain out of the middle ear, and helps keep the pressure in your ear equalized.  CAUSES   A cold or allergy can block the Eustachian tube with inflammation and the build-up of secretions. This is especially likely in small children, because their Eustachian tube is shorter and more horizontal. When the Eustachian tube closes, the normal flow of fluid from the middle ear is stopped. Fluid can accumulate and cause stuffiness, pain, hearing loss, and an ear infection if germs start growing in this area.  SYMPTOMS   The symptoms of an ear infection may include fever, ear pain, fussiness, increased crying, and irritability. Many children will have temporary and minor hearing loss during and right after an ear infection. Permanent hearing loss is rare, but the risk increases the more infections a child has. Other causes of ear pain include retained water in the outer ear canal from swimming and bathing.  Ear pain in adults is less likely to be from an ear infection. Ear pain may be referred from other locations. Referred pain may be from the joint between your jaw and the skull. It may also come from a tooth problem or problems in the neck. Other causes of ear pain include:   A foreign body in the ear.   Outer ear infection.   Sinus infections.   Impacted ear wax.   Ear injury.   Arthritis of the jaw or TMJ problems.   Middle ear infection.   Tooth infections.   Sore throat with pain to the ears.  DIAGNOSIS   Your caregiver can usually make the diagnosis by examining you. Sometimes other special studies,  including x-rays and lab work may be necessary.  TREATMENT    If antibiotics were prescribed, use them as directed and finish them even if you or your child's symptoms seem to be improved.   Sometimes PE tubes are needed in children. These are little plastic tubes which are put into the eardrum during a simple surgical procedure. They allow fluid to drain easier and allow the pressure in the middle ear to equalize. This helps relieve the ear pain caused by pressure changes.  HOME CARE INSTRUCTIONS    Only take over-the-counter or prescription medicines for pain, discomfort, or fever as directed by your caregiver. DO NOT GIVE CHILDREN ASPIRIN because of the association of Reye's Syndrome in children taking aspirin.   Use a cold pack applied to the outer ear for 15-20 minutes, 03-04 times per day or as needed may reduce pain. Do not apply ice directly to the skin. You may cause frost bite.   Over-the-counter ear drops used as directed may be effective. Your caregiver may sometimes prescribe ear drops.   Resting in an upright position may help reduce pressure in the middle ear and relieve pain.   Ear pain caused by rapidly descending from high altitudes can be relieved by swallowing or chewing gum. Allowing infants to suck on a bottle during airplane travel can help.   Do not smoke in the house or near children. If you are   unable to quit smoking, smoke outside.   Control allergies.  SEEK IMMEDIATE MEDICAL CARE IF:    You or your child are becoming sicker.   Pain or fever relief is not obtained with medicine.   You or your child's symptoms (pain, fever, or irritability) do not improve within 24 to 48 hours or as instructed.   Severe pain suddenly stops hurting. This may indicate a ruptured eardrum.   You or your children develop new problems such as severe headaches, stiff neck, difficulty swallowing, or swelling of the face or around the ear.  Document Released: 02/02/2004 Document Revised: 09/09/2011  Document Reviewed: 06/08/2008  ExitCare Patient Information 2015 [employer]. This information is not intended to replace advice given to you by your health care provider. Make sure you discuss any questions you have with your health care provider.

## 2013-12-20 ENCOUNTER — Telehealth: Payer: Self-pay | Admitting: Internal Medicine

## 2013-12-20 MED ORDER — CEFDINIR 300 MG PO CAPS: 300.0000 mg | ORAL_CAPSULE | Freq: Two times a day (BID) | ORAL | Status: DC

## 2013-12-20 NOTE — Telephone Encounter (Signed)
[facility] Triage Call Report Triage Record Num: [id number] Operator: Alexandra [name] Patient Name: Alexandra [name] Call Date & Time: 12/19/2013 1:08:40PM Patient Phone: [phone] PCP: Neta MendsWanda K. Panosh Patient Gender: Female PCP Fax : [phone] Patient DOB: 1985/12/15 [facility] Reason for Call: Caller: [name]/Patient; PCP: Berniece AndreasPanosh, Wanda (Family Practice); CB#: [phone]; Call regarding Cough/Congestion; LMP 12/09/13 Pt was seen in the ofc on 12/18/13. Dx'd w/sinusitis. Prescribed Augmentin. Today she c/o of severe L ear pain, L sided facial swelling. Tylenol does not relieve her pain. Advised UC in 4 hours if pain not relieved w/homecare advice. Symptoms Protocol. "Severe pain" Protocol(s) Used: Ear: Symptoms Recommended Outcome per Protocol: See Provider within 4 hours Reason for Outcome: Severe pain (sharp, stabbing, throbbing or excruciating aching) unresponsive to 24 hours of home care Care Advice: A warm washcloth or heating pad set on low to the affected ear may help relieve the discomfort. May apply for 15 to 20 minutes, 3 to 4 times a day. ~ Resting or sleeping with head elevated, such as semi-reclining in a recliner, may help reduce inner ear pressure and discomfort. ~ Keep ear canal as dry as possible. Take a bath instead of showering. Try to keep water out of ear when washing hair by placing cotton balls in ear opening. Avoid swimming or water sports until okayed by provider. ~ Analgesic/Antipyretic Advice - Acetaminophen: Consider acetaminophen as directed on label or by pharmacist/provider for pain or fever PRECAUTIONS: - Use if there is no history of liver disease, alcoholism, or intake of three or more alcohol drinks per day - Only if approved by provider during pregnancy or when breastfeeding - During pregnancy, acetaminophen should not be taken more than 3 consecutive days without telling provider - Do not exceed recommended  dose or frequency ~ 06/

## 2013-12-20 NOTE — Telephone Encounter (Signed)
Noted  

## 2013-12-20 NOTE — Telephone Encounter (Signed)
Patient calling about medication problem - trying to get message to MD.  [facility] and started Augmentin for fluid in both ears, ear pain.  Started vomiting after she started the Augmentin.  Ear pain increased so seen in ER yesterday 6/21 and they added ear drops that she takes Q2Hours to help control the pain. Has swelling on left side of face and jaw pain so not able to eat.  Is however taking fluids. Feels nausea after taking Augmentin, vomiting later.  Last dose of Augmentin was today 6/22 at 8 am, vomiting started about one hour ago.  Feels the vomiting is associated with the Augmentin.  Asking if antibiotic can be changed. [facility]  [phone].so her mother can pick up her prescription. Can reach patient at  [phone]

## 2013-12-20 NOTE — Telephone Encounter (Signed)
Pt following up onn switching antibiotic. pls advise

## 2013-12-20 NOTE — Telephone Encounter (Signed)
Sent to the pharmacy by e-scribe.  Pt notified. 

## 2013-12-21 LAB — CULTURE, GROUP A STREP

## 2013-12-22 ENCOUNTER — Telehealth: Payer: Self-pay | Admitting: Internal Medicine

## 2013-12-22 ENCOUNTER — Ambulatory Visit (INDEPENDENT_AMBULATORY_CARE_PROVIDER_SITE_OTHER): Payer: PRIVATE HEALTH INSURANCE | Admitting: Internal Medicine

## 2013-12-22 ENCOUNTER — Ambulatory Visit: Payer: Self-pay | Admitting: Family Medicine

## 2013-12-22 ENCOUNTER — Encounter: Payer: Self-pay | Admitting: Internal Medicine

## 2013-12-22 VITALS — BP 100/80 | Temp 98.1°F | Wt 147.0 lb

## 2013-12-22 DIAGNOSIS — R070 Pain in throat: Secondary | ICD-10-CM

## 2013-12-22 DIAGNOSIS — H9202 Otalgia, left ear: Secondary | ICD-10-CM

## 2013-12-22 DIAGNOSIS — R112 Nausea with vomiting, unspecified: Secondary | ICD-10-CM

## 2013-12-22 DIAGNOSIS — H9209 Otalgia, unspecified ear: Secondary | ICD-10-CM

## 2013-12-22 MED ORDER — PROMETHAZINE HCL 25 MG RE SUPP: 25.0000 mg | Freq: Four times a day (QID) | RECTAL | Status: DC | PRN

## 2013-12-22 MED ORDER — CEFTRIAXONE SODIUM 1 G IJ SOLR
1.0000 g | Freq: Once | INTRAMUSCULAR | Status: AC
  Administered 2013-12-22: 1 g via INTRAMUSCULAR

## 2013-12-22 MED ORDER — PROMETHAZINE HCL 50 MG/ML IJ SOLN
50.0000 mg | Freq: Once | INTRAMUSCULAR | Status: AC
  Administered 2013-12-22: 50 mg via INTRAMUSCULAR

## 2013-12-22 NOTE — Addendum Note (Signed)
Addended by: Raj JanusADKINS, MISTY T on: 12/22/2013 05:08 PM   Modules accepted: Orders

## 2013-12-22 NOTE — Telephone Encounter (Signed)
Pt is on the schedule for today 12/22/13 to see Dr. Tawanna Coolerodd.

## 2013-12-22 NOTE — Patient Instructions (Signed)
Uncertain why pain is not improving   Want you to see ent asap  Vomiting could be from illness or medication and med withdrawal.  Injection of antibiotic and pheregan today Fluids try to get the topamax and gaba low dose if possible . Suppository treated.

## 2013-12-22 NOTE — Progress Notes (Signed)
Pre visit review using our clinic review tool, if applicable. No additional management support is needed unless otherwise documented below in the visit note.   Chief Complaint  Patient presents with  . Otalgia    Started last Thursday.   . Nausea  . Emesis    HPI: Patient comes in today for SDA for  new problem evaluation.with father  Onset 6 days ago left ear pain getting worse and assoc with pain with chewing and swallowing and now NV she thought was from Augmentin given for  Poss sinus ear infection  And we switched by phone messages to [facility]  However still a problem and called triage service and sent in for evaluation. Has been seen UC and ed for this problem. Pain left ear and left jaw lower and left neck  No rash using ear drops ? No helop.   ROS: See pertinent positives and negatives per HPI.no injury fever chills . Has lost weight cause hard to eat.  hasnt taken her gaba and topamax  ( for ha suppression for a few days?)  Had a mild migraine .   Past Medical History  Diagnosis Date  . Allergic rhinitis, cause unspecified   . Anal fissure   . ADHD (attention deficit hyperactivity disorder)   . Low back pain   . Atypical chest pain   . Anemia   . Myalgia and myositis, unspecified   . Headache(784.0)   . Mononeuritis of lower limb, unspecified   . Screening examination for pulmonary tuberculosis   . Urinary tract infection, site not specified   . IBS (irritable bowel syndrome)   . History of Clostridium difficile infection     2007  . PVC (premature ventricular contraction)     had negative ECHO  . Hx of varicella   . Kidney stones   . Varicose veins     ablation  . H/O headache following lumbar puncture     done to R/O MS    Family History  Problem Relation Age of Onset  . Hypertension Mother   . Hyperlipidemia Father   . Asthma Father     History   Social History  . Marital Status: Single    Spouse Name: N/A    Number of Children: N/A  . Years of  Education: N/A   Social History Main Topics  . Smoking status: Never Smoker   . Smokeless tobacco: None  . Alcohol Use: No  . Drug Use: No  . Sexual Activity: None   Other Topics Concern  . None   Social History Narrative   Single    graduated [employer].   Living apt with roommate   2 hh  [name]    Pet  2 dogs and cat exposure   ? 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  Now 5 days per week    Outpatient Encounter Prescriptions as of 12/22/2013  Medication Sig  . acetaminophen (TYLENOL) 500 MG tablet Take 1,000 mg by mouth every 6 (six) hours as needed for moderate pain.  Marland Kitchen albuterol (PROVENTIL HFA;VENTOLIN HFA) 108 (90 BASE) MCG/ACT inhaler Inhale 2 puffs into the lungs every 6 (six) hours as needed (pre exercise or as needed for wheezing).  Marland Kitchen amphetamine-dextroamphetamine (ADDERALL XR) 20 MG 24 hr capsule Take  1 capsule (20 mg total) by mouth 2 (two) times daily.  Marland Kitchen. amphetamine-dextroamphetamine (ADDERALL) 20 MG tablet Take 1 tablet (20 mg total) by mouth 2 (two) times daily.  Marland Kitchen. antipyrine-benzocaine (AURALGAN) otic solution Place 3-4 drops into the left ear every 2 (two) hours as needed for ear pain.  . cefdinir (OMNICEF) 300 MG capsule Take 1 capsule (300 mg total) by mouth 2 (two) times daily.  Marland Kitchen. etonogestrel-ethinyl estradiol (NUVARING) 0.12-0.015 MG/24HR vaginal ring Place 1 each vaginally every 28 (twenty-eight) days. Insert vaginally and leave in place for 3 consecutive weeks, then remove for 1 week.  . fluticasone (FLONASE) 50 MCG/ACT nasal spray Place 2 sprays into the nose daily.  Marland Kitchen. gabapentin (NEURONTIN) 800 MG tablet Take 800-2,400 mg by mouth 3 (three) times daily. 800 morning, 800 afternoon and  2400 at bedtime  . HYDROcodone-acetaminophen (NORCO) 7.5-325 MG per tablet Take 1 tablet by mouth every  6 (six) hours as needed for pain.  Marland Kitchen. LORazepam (ATIVAN) 0.5 MG tablet Take 1-2 tablets (0.5-1 mg total) by mouth every 8 (eight) hours as needed for anxiety.  . montelukast (SINGULAIR) 10 MG tablet TAKE 1 TABLET BY MOUTH AT BEDTIME.  . orphenadrine (NORFLEX) 100 MG tablet Take 100 mg by mouth 2 (two) times daily.  . rizatriptan (MAXALT) 10 MG tablet Take 10 mg by mouth as needed for migraine. May repeat in 2 hours if needed  . topiramate (TOPAMAX) 100 MG tablet Take 100 mg by mouth 2 (two) times daily.  . ondansetron (ZOFRAN) 8 MG tablet Take 1 tablet (8 mg total) by mouth every 8 (eight) hours as needed for nausea.  . promethazine (PHENERGAN) 25 MG suppository Place 1 suppository (25 mg total) rectally every 6 (six) hours as needed for nausea or vomiting.  . sertraline (ZOLOFT) 50 MG tablet Take 50 mg by mouth daily.  . [DISCONTINUED] amoxicillin-clavulanate (AUGMENTIN) 875-125 MG per tablet Take 1 tablet by mouth 2 (two) times daily.  . [DISCONTINUED] HYDROcodone-acetaminophen (NORCO/VICODIN) 5-325 MG per tablet Take 1 tablet by mouth every 6 (six) hours as needed for moderate pain.  . [DISCONTINUED] Magnesium 100 MG TABS Take 1 tablet by mouth daily.    EXAM:  BP 100/80  Temp(Src) 98.1 F (36.7 C) (Oral)  Wt 147 lb (66.679 kg)  LMP 12/09/2013  Body mass index is 22.67 kg/(m^2).  GENERAL: vitals reviewed and listed above, alert, oriented, appears well hydrated non toxic down and not feeling well  HEENT: atraumatic, conjunctiva  clear, no obvious abnormalities on inspection of external nose and ears left ear eac somewhat reddened but no edema  Flaky mild tragal tenderness  OP : no lesion edema or exudate  Tender left posterior cheek teeth intact  No rash or ulcer   Pain when opening mouth no tmj click  NECK: no obvious masses on inspection palpation very tender left neck  Area ?  LUNGS: clear to auscultation bilaterally, no wheezes, rales or rhonchi, good air movement CV: HRRR, no  clubbing cyanosis or  peripheral edema nl cap refill  MS: moves all extremities without noticeable focal  abnormality PSYCH:oriented cooperative,   Skin nl color  Nl turgor  Wt Readings from Last 3 Encounters:  12/22/13 147 lb (66.679 kg)  12/18/13 155 lb (70.308 kg)  08/31/13 158 lb (71.668 kg)    ASSESSMENT AND PLAN:  Discussed the following assessment and plan:  Ear pain, left - severity progressing - Plan: Ambulatory referral to ENT  Throat pain - has lost 6 # over  illness  - Plan: Ambulatory referral to ENT  Nausea and vomiting, vomiting of unspecified type - init felt to be from antibiotic ( didnt take any paoin med) but continueing and not taking her topa or gaba - Plan: Ambulatory referral to ENT   2 visits other providers /ed visit and getting worse  hasn't been able to take her gaba and topamax and some concern that she could be having a W/D phenomena adding to the problem . Pain may be ear referred pain seems to severe or extensive for TMJ issue alone   ; since initially felt to be infection given rocephin and  phenergan today  Disc with  her and father  And  Pt  To give rocephin and  phenergan at this time  And plan to get ent  check stat .  ? If need for ct scan or other imaging .  -Patient advised to return or notify health care team  if symptoms worsen ,persist or new concerns arise.  Patient Instructions  Uncertain why pain is not improving   Want you to see ent asap  Vomiting could be from illness or medication and med withdrawal.  Injection of antibiotic and pheregan today Fluids try to get the topamax and gaba low dose if possible . Suppository treated.    Neta MendsWanda K. Panosh M.D. To see Dr Jearld FentonByers today . 2 pm

## 2013-12-22 NOTE — Telephone Encounter (Signed)
Patient Information:  Caller Name: Alexandra [name]  Phone: [phone]  Patient: Alexandra [name], Alexandra [name]  Gender: Female  DOB: [birth year]  Age: [age] Years  PCP: Berniece AndreasPanosh, Wanda (Family Practice)  Pregnant: No  Office Follow Up:  Does the office need to follow up with this patient?: No  Instructions For The Office: N/[name]   Symptoms  Reason For Call & Symptoms: Patient calling about vomiting onset 12/19/13 and ear pain onset 12/16/13   Reports unable to take Zofran due to nausea.  Estimates vomiting 10-15 episodes on 6/22 and 1 episode on 12/21/13.  Reviewed Health History In EMR: Yes  Reviewed Medications In EMR: Yes  Reviewed Allergies In EMR: Yes  Reviewed Surgeries / Procedures: Yes  Date of Onset of Symptoms: 12/16/2013  Treatments Tried: Antibiotics, ear drops for earache.  Treatments Tried Worked: Yes OB / GYN:  LMP: 12/09/2013  Guideline(s) Used:  Vomiting  Disposition Per Guideline:   See Today in Office  Reason For Disposition Reached:   Vomiting lasts > 48 hours  Advice Given:  Clear Liquids:  Sip water or [name] rehydration drink (e.g., Gatorade or Powerade).  Other options: 1/2 strength flat lemon-lime soda or ginger ale.  After 4 hours without vomiting, increase the amount.  For Non-stop Vomiting, Try Sleeping:  Try to go to sleep (Reason: sleep often empties the stomach and relieves the need to vomit).  When you awaken, resume drinking liquids. Water works best initially.  Contagiousness:  You can return to work or school after vomiting and fever are gone.  Expected Course:  Vomiting from viral gastritis usually stops in 12 to 48 hours.  People with mild dehydration can usually treat themselves at home, by drinking more liquids.  Call Back If:  You become worse.  Patient Will Follow Care Advice:  YES  Appointment Scheduled:  12/22/2013 10:15:00 Appointment Scheduled Provider:  Kelle Dartingodd, [facility](Family Practice)

## 2014-02-14 ENCOUNTER — Other Ambulatory Visit: Payer: Self-pay | Admitting: Internal Medicine

## 2014-02-14 NOTE — Telephone Encounter (Signed)
Sent to the pharmacy by e-scribe. 

## 2014-02-14 NOTE — Telephone Encounter (Signed)
Ok x 6 

## 2014-03-04 ENCOUNTER — Encounter: Payer: Self-pay | Admitting: Internal Medicine

## 2014-03-04 ENCOUNTER — Ambulatory Visit (INDEPENDENT_AMBULATORY_CARE_PROVIDER_SITE_OTHER): Payer: PRIVATE HEALTH INSURANCE | Admitting: Internal Medicine

## 2014-03-04 VITALS — BP 104/78 | Temp 97.7°F | Ht 67.5 in | Wt 154.0 lb

## 2014-03-04 DIAGNOSIS — Z87440 Personal history of urinary (tract) infections: Secondary | ICD-10-CM

## 2014-03-04 DIAGNOSIS — N390 Urinary tract infection, site not specified: Secondary | ICD-10-CM

## 2014-03-04 DIAGNOSIS — N3010 Interstitial cystitis (chronic) without hematuria: Secondary | ICD-10-CM

## 2014-03-04 DIAGNOSIS — R8290 Unspecified abnormal findings in urine: Secondary | ICD-10-CM | POA: Insufficient documentation

## 2014-03-04 DIAGNOSIS — R109 Unspecified abdominal pain: Secondary | ICD-10-CM

## 2014-03-04 DIAGNOSIS — R82998 Other abnormal findings in urine: Secondary | ICD-10-CM

## 2014-03-04 LAB — POCT URINALYSIS DIPSTICK
Bilirubin, UA: NEGATIVE
Blood, UA: NEGATIVE
Glucose, UA: NEGATIVE
Ketones, UA: NEGATIVE
Nitrite, UA: POSITIVE
PH UA: 6.5
Protein, UA: NEGATIVE
Spec Grav, UA: 1.01
UROBILINOGEN UA: 0.2

## 2014-03-04 MED ORDER — CIPROFLOXACIN HCL 500 MG PO TABS: 500.0000 mg | ORAL_TABLET | Freq: Two times a day (BID) | ORAL | Status: DC

## 2014-03-04 NOTE — Progress Notes (Signed)
Pre visit review using our clinic review tool, if applicable. No additional management support is needed unless otherwise documented below in the visit note.  Chief Complaint  Patient presents with  . Urine Odor    Started a few months ago.  . Chills  . Night Sweats  . Abdominal Pain    HPI: Patient Alexandra [name]  comes in today for SDA for  new problem evaluation. Onset a few months ago   Married a few months ago off an on  [employer]  Took macrobid    Helped   The odor     no dysuria but cramping like her ic but right back pain ? Different than her baseline   2 days this week.  Took macrobid  Not better  Hx of recurrent utis IC and  Renal stones  ? Chills  And cold at home and cramping  .   Off nuvaring   Back pain  .   Urologist   Dx IC.gabapentin.   Cant take other meds  Gyne gives her open rx for macrobid  ROS: See pertinent positives and negatives per HPI. 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 Allergic rhinitis, cause unspecified   . Anal fissure   . ADHD (attention deficit hyperactivity disorder)   . Low back pain   . Atypical chest pain   . Anemia   . Myalgia and myositis, unspecified   . Headache(784.0)   . Mononeuritis of lower limb, unspecified   . Screening examination for pulmonary tuberculosis   . Urinary tract infection, site not specified   . IBS (irritable bowel syndrome)   . History of Clostridium difficile infection     2007  . PVC (premature ventricular contraction)     had negative ECHO  . Hx of varicella   . Kidney stones   . Varicose veins     ablation  . H/O headache following lumbar puncture     done to R/O MS    Family History  Problem Relation Age of Onset  . Hypertension Mother   . Hyperlipidemia Father   . Asthma Father     History   Social History  . Marital Status: Single    Spouse Name: N/A    Number of Children: N/A  . Years  of Education: N/A   Social History Main Topics  . Smoking status: Never Smoker   . Smokeless tobacco: None  . Alcohol Use: No  . Drug Use: No  . Sexual Activity: None   Other Topics Concern  . None   Social History Narrative   Single    graduated [employer].   Now married spring summer 15    2 hh  [name]    Pet  2 dogs and cat exposure   ? 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     Insurance at [employer]    Outpatient Encounter Prescriptions as of 03/04/2014  Medication Sig  . albuterol (PROVENTIL HFA;VENTOLIN HFA) 108 (90 BASE) MCG/ACT inhaler Inhale 2 puffs into the lungs every 6 (six) hours as needed (pre exercise or as needed for wheezing).  Marland Kitchen amphetamine-dextroamphetamine (ADDERALL) 20 MG tablet Take  1 tablet (20 mg total) by mouth 2 (two) times daily.  . fluticasone (FLONASE) 50 MCG/ACT nasal spray PLACE 2 SPRAYS INTO THE NOSE DAILY.  Marland Kitchen gabapentin (NEURONTIN) 800 MG tablet Take 800-2,400 mg by mouth 3 (three) times daily. 800 morning, 800 afternoon and  2400 at bedtime  . HYDROcodone-acetaminophen (NORCO) 7.5-325 MG per tablet Take 1 tablet by mouth every 6 (six) hours as needed for pain.  Marland Kitchen LORazepam (ATIVAN) 0.5 MG tablet Take 1-2 tablets (0.5-1 mg total) by mouth every 8 (eight) hours as needed for anxiety.  . montelukast (SINGULAIR) 10 MG tablet TAKE 1 TABLET BY MOUTH AT BEDTIME.  . nitrofurantoin, macrocrystal-monohydrate, (MACROBID) 100 MG capsule Using PRN from Gynecologist.  . orphenadrine (NORFLEX) 100 MG tablet Take 100 mg by mouth 2 (two) times daily.  . rizatriptan (MAXALT) 10 MG tablet Take 10 mg by mouth as needed for migraine. May repeat in 2 hours if needed  . sertraline (ZOLOFT) 50 MG tablet Take 50 mg by mouth daily.  Marland Kitchen topiramate (TOPAMAX) 100 MG tablet Take 100 mg by mouth 2 (two) times daily.  .  ciprofloxacin (CIPRO) 500 MG tablet Take 1 tablet (500 mg total) by mouth 2 (two) times daily.  . promethazine (PHENERGAN) 25 MG suppository Place 1 suppository (25 mg total) rectally every 6 (six) hours as needed for nausea or vomiting.  . [DISCONTINUED] acetaminophen (TYLENOL) 500 MG tablet Take 1,000 mg by mouth every 6 (six) hours as needed for moderate pain.  . [DISCONTINUED] amphetamine-dextroamphetamine (ADDERALL XR) 20 MG 24 hr capsule Take 1 capsule (20 mg total) by mouth 2 (two) times daily.  . [DISCONTINUED] antipyrine-benzocaine (AURALGAN) otic solution Place 3-4 drops into the left ear every 2 (two) hours as needed for ear pain.  . [DISCONTINUED] cefdinir (OMNICEF) 300 MG capsule Take 1 capsule (300 mg total) by mouth 2 (two) times daily.  . [DISCONTINUED] etonogestrel-ethinyl estradiol (NUVARING) 0.12-0.015 MG/24HR vaginal ring Place 1 each vaginally every 28 (twenty-eight) days. Insert vaginally and leave in place for 3 consecutive weeks, then remove for 1 week.  . [DISCONTINUED] ondansetron (ZOFRAN) 8 MG tablet Take 1 tablet (8 mg total) by mouth every 8 (eight) hours as needed for nausea.    EXAM:  BP 104/78  Temp(Src) 97.7 F (36.5 C) (Oral)  Ht 5' 7.5" (1.715 m)  Wt 154 lb (69.854 kg)  BMI 23.75 kg/m2  Body mass index is 23.75 kg/(m^2).  GENERAL: vitals reviewed and listed above, alert, oriented, appears well hydrated and in no acute distress non toxic  HEENT: atraumatic, conjunctiva  clear, no obvious abnormalities on inspection of external nose and ears NECK: no obvious masses on inspection palpation  LUNGS: clear to auscultation bilaterally, no wheezes, rales or rhonchi, good air movement Abdomen:  Sof,t normal bowel sounds without hepatosplenomegaly, no guarding rebound or masses ? If slight right  CVA tenderness CV: HRRR, no clubbing cyanosis or  peripheral edema nl cap refill  MS: moves all extremities without noticeable focal  abnormality PSYCH: pleasant and  cooperative, no obvious depression or anxiety UA tc leuk pos nitrities  ASSESSMENT AND PLAN:  Discussed the following assessment and plan:  Abnormal urine odor - Plan: POC Urinalysis Dipstick, Culture, Urine  Urinary tract infection, site not specified - prob partly rx  on prn macrobid from gyne get urine cx and cipro antibiotic today may need to fu with uro if needed   Abdominal pain, unspecified site - Plan: POC Urinalysis Dipstick, Culture, Urine  Hx: UTI (urinary tract  infection)  Interstitial cystitis rx for a week with hx of poss chills and partial rx meds  -Patient advised to return or notify health care team  if symptoms worsen ,persist or new concerns arise.  Patient Instructions  Take cipro  Will contact you about culture results . This sounds like infection and not IC. Monitor antibiotic use of UTI over the year  Incase have to get back with urology.      Neta Mends. Adaleen Hulgan M.D.

## 2014-03-04 NOTE — Patient Instructions (Signed)
Take cipro  Will contact you about culture results . This sounds like infection and not IC. Monitor antibiotic use of UTI over the year  Incase have to get back with urology.

## 2014-03-08 LAB — URINE CULTURE: Colony Count: >=100000

## 2014-03-08 NOTE — Progress Notes (Signed)
Quick Note:  Tell patient that urine culture shows e coli sensitive to medication given cipro . Is resistant to macrobid and rocephin. Should resolve with current treatment .FU if not better. ______

## 2014-03-17 ENCOUNTER — Telehealth: Payer: Self-pay | Admitting: Internal Medicine

## 2014-03-17 NOTE — Telephone Encounter (Signed)
Pt was notified last refill that she will need a OV before that prescription ran out.  Have her get on the schedule and will then fill until she comes in.

## 2014-03-17 NOTE — Telephone Encounter (Signed)
Pt was just seen 03-04-14

## 2014-03-17 NOTE — Telephone Encounter (Signed)
Pt needs new rx generic adderall 20 mg #180 °

## 2014-03-18 MED ORDER — AMPHETAMINE-DEXTROAMPHETAMINE 20 MG PO TABS: 20.0000 mg | ORAL_TABLET | Freq: Two times a day (BID) | ORAL | Status: DC

## 2014-03-18 NOTE — Telephone Encounter (Signed)
Pt is scheduled for 03/28/14 at 3:45 and she will come today after work to pick-up RX.

## 2014-03-18 NOTE — Telephone Encounter (Signed)
She was seen for bladder issues.  Not a follow up of medication.  Please schedule the pt.  Thanks!

## 2014-03-28 ENCOUNTER — Ambulatory Visit (INDEPENDENT_AMBULATORY_CARE_PROVIDER_SITE_OTHER): Payer: PRIVATE HEALTH INSURANCE | Admitting: Internal Medicine

## 2014-03-28 ENCOUNTER — Encounter: Payer: Self-pay | Admitting: Internal Medicine

## 2014-03-28 VITALS — BP 96/60 | Temp 98.3°F | Wt 156.0 lb

## 2014-03-28 DIAGNOSIS — Z79899 Other long term (current) drug therapy: Secondary | ICD-10-CM

## 2014-03-28 DIAGNOSIS — F909 Attention-deficit hyperactivity disorder, unspecified type: Secondary | ICD-10-CM

## 2014-03-28 MED ORDER — AMPHETAMINE-DEXTROAMPHETAMINE 20 MG PO TABS: 20.0000 mg | ORAL_TABLET | Freq: Two times a day (BID) | ORAL | Status: DC

## 2014-03-28 NOTE — Patient Instructions (Signed)
Continue med as discussed .  ROV in 6 months

## 2014-03-28 NOTE — Progress Notes (Signed)
Pre visit review using our clinic review tool, if applicable. No additional management support is needed unless otherwise documented below in the visit note.  Chief Complaint  Patient presents with  . Follow-up    Refills of Adderall    HPI: Alexandra [name]  Here for fu adhd medication management   Since last visit uti better  ADHD:  Taking  One in am 6 am  and  One in pm if needed lunch time .   Not a lot on weekends  Sleep  Hours  9 - 4 30. No cv pulm sx of significance  Neurology has check every 3 months  Prescribes rest of meds  Hydrocodone  As needed  Takes only about 2 x per month Gaba and topamax .  Regular use siome help  Not on zoloft and lorazepam Doing much better with job change.   ROS: See pertinent positives and negatives per HPI. See above  No tremor  HAs at baseline Neg td  Past Medical History  Diagnosis Date  . Allergic rhinitis, cause unspecified   . Anal fissure   . ADHD (attention deficit hyperactivity disorder)   . Low back pain   . Atypical chest pain   . Anemia   . Myalgia and myositis, unspecified   . Headache(784.0)   . Mononeuritis of lower limb, unspecified   . Screening examination for pulmonary tuberculosis   . Urinary tract infection, site not specified   . IBS (irritable bowel syndrome)   . History of Clostridium difficile infection     2007  . PVC (premature ventricular contraction)     had negative ECHO  . Hx of varicella   . Kidney stones   . Varicose veins     ablation  . H/O headache following lumbar puncture     done to R/O MS    Family History  Problem Relation Age of Onset  . Hypertension Mother   . Hyperlipidemia Father   . Asthma Father     History   Social History  . Marital Status: Married    Spouse Name: N/A    Number of Children: N/A  . Years of Education: N/A   Social History Main Topics  . Smoking status: Never Smoker   . Smokeless tobacco: None  . Alcohol Use: No  . Drug Use: No  . Sexual  Activity: None   Other Topics Concern  . None   Social History Narrative   Single    graduated [employer].   Now married spring summer 15    2 hh  [name]    Pet  2 dogs and cat exposure   ? 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     Insurance at [employer]    Outpatient Encounter Prescriptions as of 03/28/2014  Medication Sig  . albuterol (PROVENTIL HFA;VENTOLIN HFA) 108 (90 BASE) MCG/ACT inhaler Inhale 2 puffs into the lungs every 6 (six) hours as needed (pre exercise or as needed for wheezing).  Marland Kitchen amphetamine-dextroamphetamine (ADDERALL) 20 MG tablet Take 1 tablet (20 mg total) by mouth 2 (two) times daily.  . fluticasone (FLONASE) 50 MCG/ACT nasal spray PLACE 2 SPRAYS INTO THE NOSE DAILY.  Marland Kitchen gabapentin (NEURONTIN) 800 MG tablet Take 800-2,400 mg by mouth 3 (three) times daily. 800 morning,  800 afternoon and  2400 at bedtime  . HYDROcodone-acetaminophen (NORCO) 7.5-325 MG per tablet Take 1 tablet by mouth every 6 (six) hours as needed for pain.  Marland Kitchen LORazepam (ATIVAN) 0.5 MG tablet Take 1-2 tablets (0.5-1 mg total) by mouth every 8 (eight) hours as needed for anxiety.  . montelukast (SINGULAIR) 10 MG tablet TAKE 1 TABLET BY MOUTH AT BEDTIME.  . nitrofurantoin, macrocrystal-monohydrate, (MACROBID) 100 MG capsule Using PRN from Gynecologist.  . orphenadrine (NORFLEX) 100 MG tablet Take 100 mg by mouth 2 (two) times daily.  . promethazine (PHENERGAN) 25 MG suppository Place 1 suppository (25 mg total) rectally every 6 (six) hours as needed for nausea or vomiting.  . rizatriptan (MAXALT) 10 MG tablet Take 10 mg by mouth as needed for migraine. May repeat in 2 hours if needed  . topiramate (TOPAMAX) 100 MG tablet Take 100 mg by mouth 2 (two) times daily.  . [DISCONTINUED] amphetamine-dextroamphetamine (ADDERALL) 20 MG tablet Take 1  tablet (20 mg total) by mouth 2 (two) times daily.  . [DISCONTINUED] ciprofloxacin (CIPRO) 500 MG tablet Take 1 tablet (500 mg total) by mouth 2 (two) times daily.  . [DISCONTINUED] sertraline (ZOLOFT) 50 MG tablet Take 50 mg by mouth daily.    EXAM:  BP 96/60  Temp(Src) 98.3 F (36.8 C) (Oral)  Wt 156 lb (70.761 kg)  Body mass index is 24.06 kg/(m^2).  GENERAL: vitals reviewed and listed above, alert, oriented, appears well hydrated and in no acute distress HEENT: atraumatic, conjunctiva  clear, no obvious abnormalities on inspection of external nose and ears  NECK: no obvious masses on inspection palpation  LUNGS: clear to auscultation bilaterally, no wheezes, rales or rhonchi, good air movement CV: HRRR, no clubbing cyanosis or  peripheral edema nl cap refill  Abdomen:  Sof,t normal bowel sounds without hepatosplenomegaly, no guarding rebound or masses no CVA tenderness MS: moves all extremities without noticeable focal  Abnormality Neuro grossly non focal  Motor Sensory not tested PSYCH: pleasant and cooperative, no obvious depression or anxiety  ASSESSMENT AND PLAN:  Discussed the following assessment and plan:  Attention deficit hyperactivity disorder (ADHD), unspecified ADHD type  Medication management  High risk medications (not anticoagulants) long-term use  ATTENTION DEFICIT HYPERACTIVITY DISORDER, ADULT Anxiety mood much better since job changes  Says sometimes moody  Off teh nuvaring  prob not the medication  Poss pms like sx  Not sever  Monitor  Fu fi  persistent or progressive  -Patient advised to return or notify health care team  if symptoms worsen ,persist or new concerns arise.  Patient Instructions  Continue med as discussed .  ROV in 6 months    Neta Mends. Panosh M.D.

## 2014-06-29 ENCOUNTER — Telehealth: Payer: Self-pay | Admitting: Internal Medicine

## 2014-06-29 MED ORDER — AMPHETAMINE-DEXTROAMPHETAMINE 20 MG PO TABS: 20.0000 mg | ORAL_TABLET | Freq: Two times a day (BID) | ORAL | Status: DC

## 2014-06-29 NOTE — Telephone Encounter (Signed)
Pt needs new rx generic adderall 20 mg #180

## 2014-06-29 NOTE — Telephone Encounter (Signed)
Pt notified to pick up rx at the front desk.

## 2014-07-01 NOTE — L&D Delivery Note (Signed)
Delivery Note Pt reached complete dilation and pushed great. At 11:48 PM a healthy female was delivered via Vaginal, Spontaneous Delivery (Presentation: Right Occiput Anterior).  APGAR: 9, 9; weight  .   Placenta status: Intact, Expressed.  Cord: 3 vessels with the following complications: None.   Anesthesia: Epidural  Episiotomy: None Lacerations: Labial;right Suture Repair: 3.0 vicryl Est. Blood Loss (mL):  Mom to postpartum.  Baby to Couplet care / Skin to Skin.  Alexandra [name] 03/02/2015, 12:49 AM

## 2014-08-08 LAB — OB RESULTS CONSOLE ANTIBODY SCREEN: ANTIBODY SCREEN: NEGATIVE

## 2014-08-08 LAB — OB RESULTS CONSOLE GC/CHLAMYDIA
CHLAMYDIA, DNA PROBE: NEGATIVE
GC PROBE AMP, GENITAL: NEGATIVE

## 2014-08-08 LAB — OB RESULTS CONSOLE ABO/RH: RH Type: POSITIVE

## 2014-08-08 LAB — OB RESULTS CONSOLE HIV ANTIBODY (ROUTINE TESTING): HIV: NONREACTIVE

## 2014-08-08 LAB — OB RESULTS CONSOLE RUBELLA ANTIBODY, IGM: RUBELLA: IMMUNE

## 2014-08-08 LAB — OB RESULTS CONSOLE HEPATITIS B SURFACE ANTIGEN: Hepatitis B Surface Ag: NEGATIVE

## 2014-08-08 LAB — OB RESULTS CONSOLE RPR: RPR: NONREACTIVE

## 2014-08-12 ENCOUNTER — Encounter: Payer: Self-pay | Admitting: Internal Medicine

## 2014-09-26 ENCOUNTER — Encounter: Payer: PRIVATE HEALTH INSURANCE | Admitting: Internal Medicine

## 2014-09-26 NOTE — Progress Notes (Signed)
Patient did not come to appt see phone note  She is pregnant and not taking meds

## 2014-11-09 ENCOUNTER — Emergency Department (HOSPITAL_COMMUNITY)
Admission: EM | Admit: 2014-11-09 | Discharge: 2014-11-09 | Disposition: A | Discharge status: Home / Self Care | Payer: PRIVATE HEALTH INSURANCE | Attending: Emergency Medicine | Admitting: Emergency Medicine

## 2014-11-09 ENCOUNTER — Encounter (HOSPITAL_COMMUNITY): Payer: Self-pay | Admitting: *Deleted

## 2014-11-09 DIAGNOSIS — F909 Attention-deficit hyperactivity disorder, unspecified type: Secondary | ICD-10-CM | POA: Insufficient documentation

## 2014-11-09 DIAGNOSIS — Z8719 Personal history of other diseases of the digestive system: Secondary | ICD-10-CM | POA: Insufficient documentation

## 2014-11-09 DIAGNOSIS — Z8709 Personal history of other diseases of the respiratory system: Secondary | ICD-10-CM | POA: Insufficient documentation

## 2014-11-09 DIAGNOSIS — Y9289 Other specified places as the place of occurrence of the external cause: Secondary | ICD-10-CM | POA: Diagnosis not present

## 2014-11-09 DIAGNOSIS — S61207A Unspecified open wound of left little finger without damage to nail, initial encounter: Secondary | ICD-10-CM | POA: Insufficient documentation

## 2014-11-09 DIAGNOSIS — Z862 Personal history of diseases of the blood and blood-forming organs and certain disorders involving the immune mechanism: Secondary | ICD-10-CM | POA: Insufficient documentation

## 2014-11-09 DIAGNOSIS — Z79899 Other long term (current) drug therapy: Secondary | ICD-10-CM | POA: Diagnosis not present

## 2014-11-09 DIAGNOSIS — T148XXA Other injury of unspecified body region, initial encounter: Secondary | ICD-10-CM

## 2014-11-09 DIAGNOSIS — Y288XXA Contact with other sharp object, undetermined intent, initial encounter: Secondary | ICD-10-CM | POA: Insufficient documentation

## 2014-11-09 DIAGNOSIS — Y9389 Activity, other specified: Secondary | ICD-10-CM | POA: Diagnosis not present

## 2014-11-09 DIAGNOSIS — Z8619 Personal history of other infectious and parasitic diseases: Secondary | ICD-10-CM | POA: Diagnosis not present

## 2014-11-09 DIAGNOSIS — Z8669 Personal history of other diseases of the nervous system and sense organs: Secondary | ICD-10-CM | POA: Diagnosis not present

## 2014-11-09 DIAGNOSIS — O99342 Other mental disorders complicating pregnancy, second trimester: Secondary | ICD-10-CM | POA: Diagnosis not present

## 2014-11-09 DIAGNOSIS — Z87440 Personal history of urinary (tract) infections: Secondary | ICD-10-CM | POA: Diagnosis not present

## 2014-11-09 DIAGNOSIS — Z87442 Personal history of urinary calculi: Secondary | ICD-10-CM | POA: Diagnosis not present

## 2014-11-09 DIAGNOSIS — Z8739 Personal history of other diseases of the musculoskeletal system and connective tissue: Secondary | ICD-10-CM | POA: Diagnosis not present

## 2014-11-09 DIAGNOSIS — Z3A27 27 weeks gestation of pregnancy: Secondary | ICD-10-CM | POA: Insufficient documentation

## 2014-11-09 DIAGNOSIS — Z8611 Personal history of tuberculosis: Secondary | ICD-10-CM | POA: Diagnosis not present

## 2014-11-09 DIAGNOSIS — Z7951 Long term (current) use of inhaled steroids: Secondary | ICD-10-CM | POA: Insufficient documentation

## 2014-11-09 DIAGNOSIS — O9A212 Injury, poisoning and certain other consequences of external causes complicating pregnancy, second trimester: Secondary | ICD-10-CM | POA: Insufficient documentation

## 2014-11-09 DIAGNOSIS — Y998 Other external cause status: Secondary | ICD-10-CM | POA: Insufficient documentation

## 2014-11-09 MED ORDER — LIDOCAINE HCL (PF) 1 % IJ SOLN
5.0000 mL | Freq: Once | INTRAMUSCULAR | Status: AC
  Administered 2014-11-09: 5 mL via INTRADERMAL
  Filled 2014-11-09: qty 5

## 2014-11-09 NOTE — ED Provider Notes (Signed)
CSN: [id number]     Arrival date & time 11/09/14  1649 History  This chart was scribed for a non-physician practitioner, Arthor CaptainAbigail Bettie Capistran, PA-C working with Raeford RazorStephen Kohut, MD by Swazil[place]Jordan Peace, ED Scribe. The patient was seen in TR05C/TR05C. The patient's care was started at 5:44 PM.    Chief Complaint  Patient presents with  . Laceration      Patient is a 29 y.o. female presenting with skin laceration. The history is provided by the patient. No language interpreter was used.  Laceration HPI Comments: Alexandra [name] is a 29 y.o. female who is 6 months pregnant presents to the Emergency Department complaining of laceration to medial aspect of her left pinky finger that occurred earlier today while pt was cutting cantalope. No active bleeding noted. Pt is up to date with her Tetanus vaccination. Pt is non-smoker.    Past Medical History  Diagnosis Date  . Allergic rhinitis, cause unspecified   . Anal fissure   . ADHD (attention deficit hyperactivity disorder)   . Low back pain   . Atypical chest pain   . Anemia   . Myalgia and myositis, unspecified   . Headache(784.0)   . Mononeuritis of lower limb, unspecified   . Screening examination for pulmonary tuberculosis   . Urinary tract infection, site not specified   . IBS (irritable bowel syndrome)   . History of Clostridium difficile infection     2007  . PVC (premature ventricular contraction)     had negative ECHO  . Hx of varicella   . Kidney stones   . Varicose veins     ablation  . H/O headache following lumbar puncture     done to R/O MS   Past Surgical History  Procedure Laterality Date  . Breast surgery      augmentation  . Endovenous ablation saphenous vein w/ laser     Family History  Problem Relation Age of Onset  . Hypertension Mother   . Hyperlipidemia Father   . Asthma Father    History  Substance Use Topics  . Smoking status: Never Smoker   . Smokeless tobacco: Not on file  . Alcohol Use: No   OB  History    Gravida Para Term Preterm AB TAB SAB Ectopic Multiple Living   1              Review of Systems  Constitutional: Negative for fever and chills.  Gastrointestinal: Negative for nausea and vomiting.  Skin: Positive for wound.      Allergies  Lexapro; Septra; and Sulfa antibiotics  Home Medications   Prior to Admission medications   Medication Sig Start Date End Date Taking? Authorizing Provider  albuterol (PROVENTIL HFA;VENTOLIN HFA) 108 (90 BASE) MCG/ACT inhaler Inhale 2 puffs into the lungs every 6 (six) hours as needed (pre exercise or as needed for wheezing). 05/17/13  Yes Madelin HeadingsWanda K Panosh, MD  fluticasone (FLONASE) 50 MCG/ACT nasal spray PLACE 2 SPRAYS INTO THE NOSE DAILY. 02/14/14  Yes Madelin HeadingsWanda K Panosh, MD  Prenatal Vit-Fe Fumarate-FA (PRENATAL PO) Take 1 tablet by mouth daily.   Yes Historical Provider, MD  amphetamine-dextroamphetamine (ADDERALL) 20 MG tablet Take 1 tablet (20 mg total) by mouth 2 (two) times daily. Patient not taking: Reported on 11/09/2014 06/29/14 06/29/15  Madelin HeadingsWanda K Panosh, MD  LORazepam (ATIVAN) 0.5 MG tablet Take 1-2 tablets (0.5-1 mg total) by mouth every 8 (eight) hours as needed for anxiety. Patient not taking: Reported on 11/09/2014 08/10/13  Madelin HeadingsWanda K Panosh, MD  montelukast (SINGULAIR) 10 MG tablet TAKE 1 TABLET BY MOUTH AT BEDTIME. Patient not taking: Reported on 11/09/2014    Madelin HeadingsWanda K Panosh, MD  promethazine (PHENERGAN) 25 MG suppository Place 1 suppository (25 mg total) rectally every 6 (six) hours as needed for nausea or vomiting. Patient not taking: Reported on 11/09/2014 12/22/13   Madelin HeadingsWanda K Panosh, MD   LMP 12/09/2013 Physical Exam  Constitutional: She is oriented to person, place, and time. She appears well-developed and well-nourished. No distress.  HENT:  Head: Normocephalic and atraumatic.  Eyes: Conjunctivae and EOM are normal.  Neck: Neck supple. No tracheal deviation present.  Cardiovascular: Normal rate.   Pulmonary/Chest: Effort  normal. No respiratory distress.  Musculoskeletal: Normal range of motion.  Neurological: She is alert and oriented to person, place, and time.  Skin: Skin is warm and dry.  1cm horseshoe shaped avulsion of the medial pinky of left hand.   Psychiatric: She has a normal mood and affect. Her behavior is normal.  Nursing note and vitals reviewed.   ED Course  Procedures (including critical care time) Labs Review Labs Reviewed - No data to display  Imaging Review No results found.   EKG Interpretation None     Medications - No data to display  5:46 PM- Treatment plan was discussed with patient who verbalizes understanding and agrees.   LACERATION REPAIR Performed by: Arthor CaptainHarris, Corky Blumstein Authorized by: Arthor CaptainHarris, Aviyon Hocevar Consent: Verbal consent obtained. Risks and benefits: risks, benefits and alternatives were discussed Consent given by: patient Patient identity confirmed: provided demographic data Prepped and Draped in normal sterile fashion Wound explored  Laceration Location: left 5th finger  Laceration Length: 1 cm  No Foreign Bodies seen or palpated  Anesthesia: local infiltration  Local anesthetic: lidocaine 1% w/o epinephrine  Anesthetic total: 2 ml  Irrigation method: syringe Amount of cleaning: standard  Skin closure: 5.0 chromic gut  Number of sutures: 2  Technique: SI  Patient tolerance: Patient tolerated the procedure well with no immediate complications.   MDM   Final diagnoses:  Skin avulsion      .Pressure irrigation performed. Laceration occurred < 8 hours prior to repair which was well tolerated. Pt has no co morbidities to effect normal wound healing. Discussed suture home care w pt and answered questions.Pt is hemodynamically stable w no complaints prior to dc.    I personally performed the services described in this documentation, which was scribed in my presence. The recorded information has been reviewed and is accurate.     Arthor Captainbigail  Langley Ingalls, PA-C 11/11/14 1639  Raeford RazorStephen Kohut, MD 11/11/14 [phone]

## 2014-11-09 NOTE — Discharge Instructions (Signed)

## 2014-11-09 NOTE — ED Notes (Signed)
Dressing with non-adherent bandage and wrap applied to sutured area.

## 2014-11-09 NOTE — ED Notes (Signed)
The pt has a small flap-type laceration to her lt little finger noi active blkeeding.  She did with a knife while cutting cantalope.  6 months preg edc aug 26th

## 2014-12-08 ENCOUNTER — Encounter (HOSPITAL_COMMUNITY): Payer: Self-pay

## 2014-12-08 ENCOUNTER — Inpatient Hospital Stay (HOSPITAL_COMMUNITY)
Admission: AD | Admit: 2014-12-08 | Discharge: 2014-12-09 | Disposition: A | Discharge status: Other Acute Inpatient Hospital | Payer: PRIVATE HEALTH INSURANCE | Source: Ambulatory Visit | Attending: Obstetrics and Gynecology | Admitting: Obstetrics and Gynecology

## 2014-12-08 DIAGNOSIS — Z87440 Personal history of urinary (tract) infections: Secondary | ICD-10-CM | POA: Insufficient documentation

## 2014-12-08 DIAGNOSIS — F909 Attention-deficit hyperactivity disorder, unspecified type: Secondary | ICD-10-CM | POA: Diagnosis not present

## 2014-12-08 DIAGNOSIS — O4690 Antepartum hemorrhage, unspecified, unspecified trimester: Secondary | ICD-10-CM | POA: Insufficient documentation

## 2014-12-08 DIAGNOSIS — Z87442 Personal history of urinary calculi: Secondary | ICD-10-CM | POA: Diagnosis not present

## 2014-12-08 DIAGNOSIS — O4693 Antepartum hemorrhage, unspecified, third trimester: Secondary | ICD-10-CM | POA: Insufficient documentation

## 2014-12-08 DIAGNOSIS — Z8619 Personal history of other infectious and parasitic diseases: Secondary | ICD-10-CM | POA: Insufficient documentation

## 2014-12-08 DIAGNOSIS — O99343 Other mental disorders complicating pregnancy, third trimester: Secondary | ICD-10-CM | POA: Insufficient documentation

## 2014-12-08 DIAGNOSIS — Z3A29 29 weeks gestation of pregnancy: Secondary | ICD-10-CM | POA: Insufficient documentation

## 2014-12-08 DIAGNOSIS — Z8679 Personal history of other diseases of the circulatory system: Secondary | ICD-10-CM | POA: Insufficient documentation

## 2014-12-08 DIAGNOSIS — Z79899 Other long term (current) drug therapy: Secondary | ICD-10-CM | POA: Insufficient documentation

## 2014-12-08 DIAGNOSIS — Z8739 Personal history of other diseases of the musculoskeletal system and connective tissue: Secondary | ICD-10-CM | POA: Insufficient documentation

## 2014-12-08 DIAGNOSIS — O99013 Anemia complicating pregnancy, third trimester: Secondary | ICD-10-CM | POA: Insufficient documentation

## 2014-12-08 DIAGNOSIS — Z8719 Personal history of other diseases of the digestive system: Secondary | ICD-10-CM | POA: Insufficient documentation

## 2014-12-08 DIAGNOSIS — Z7951 Long term (current) use of inhaled steroids: Secondary | ICD-10-CM | POA: Insufficient documentation

## 2014-12-08 DIAGNOSIS — Z8669 Personal history of other diseases of the nervous system and sense organs: Secondary | ICD-10-CM | POA: Insufficient documentation

## 2014-12-08 LAB — CBC WITH DIFFERENTIAL/PLATELET
BASOS ABS: 0 10*3/uL (ref 0.0–0.1)
Basophils Relative: 0 % (ref 0–1)
Eosinophils Absolute: 0.2 10*3/uL (ref 0.0–0.7)
Eosinophils Relative: 1 % (ref 0–5)
HEMATOCRIT: 37.6 % (ref 36.0–46.0)
Hemoglobin: 13.2 g/dL (ref 12.0–15.0)
LYMPHS ABS: 3.3 10*3/uL (ref 0.7–4.0)
Lymphocytes Relative: 27 % (ref 12–46)
MCH: 31.8 pg (ref 26.0–34.0)
MCHC: 35.1 g/dL (ref 30.0–36.0)
MCV: 90.6 fL (ref 78.0–100.0)
MONOS PCT: 9 % (ref 3–12)
Monocytes Absolute: 1.2 10*3/uL — ABNORMAL HIGH (ref 0.1–1.0)
NEUTROS PCT: 63 % (ref 43–77)
Neutro Abs: 7.9 10*3/uL — ABNORMAL HIGH (ref 1.7–7.7)
PLATELETS: 219 10*3/uL (ref 150–400)
RBC: 4.15 MIL/uL (ref 3.87–5.11)
RDW: 12.8 % (ref 11.5–15.5)
WBC: 12.6 10*3/uL — AB (ref 4.0–10.5)

## 2014-12-08 LAB — COMPREHENSIVE METABOLIC PANEL
ALBUMIN: 3 g/dL — AB (ref 3.5–5.0)
ALT: 14 U/L (ref 14–54)
AST: 21 U/L (ref 15–41)
Alkaline Phosphatase: 71 U/L (ref 38–126)
Anion gap: 9 (ref 5–15)
BUN: 10 mg/dL (ref 6–20)
CHLORIDE: 104 mmol/L (ref 101–111)
CO2: 24 mmol/L (ref 22–32)
CREATININE: 0.54 mg/dL (ref 0.44–1.00)
Calcium: 8.6 mg/dL — ABNORMAL LOW (ref 8.9–10.3)
GLUCOSE: 109 mg/dL — AB (ref 65–99)
Potassium: 3.9 mmol/L (ref 3.5–5.1)
Sodium: 137 mmol/L (ref 135–145)
Total Bilirubin: 0.4 mg/dL (ref 0.3–1.2)
Total Protein: 6.6 g/dL (ref 6.5–8.1)

## 2014-12-08 LAB — ABO/RH: ABO/RH(D): B POS

## 2014-12-08 LAB — HCG, QUANTITATIVE, PREGNANCY: hCG, Beta Chain, Quant, S: 10464 m[IU]/mL — ABNORMAL HIGH (ref <5)

## 2014-12-08 NOTE — MAU Note (Signed)
Pt states that at 2200 tonight she was having intercourse and right afterwards began bleeding bright red blood that was as heavy as a period. Also did not feel fetal movement for around 45 minutes. She now feels baby kicking well. Denies cramping, pain, ctx, or LOF.

## 2014-12-08 NOTE — ED Notes (Addendum)
Pt presents with c/o vaginal bleeding that just started a few minutes ago. Pt reports she is [redacted] weeks pregnant, EDD 02/24/15. Pt reports the bleeding is continuously bleeding, no abdominal pain at this time.

## 2014-12-08 NOTE — ED Provider Notes (Signed)
CSN: [id number]     Arrival date & time 12/08/14  2228 History   First MD Initiated Contact with Patient 12/08/14 2231     Chief Complaint  Patient presents with  . Vaginal Bleeding     (Consider location/radiation/quality/duration/timing/severity/associated sxs/prior Treatment) HPI Comments: The patient is a 29 year old female, she is G1 P0 at 29 weeks with an uncommon located pregnancy, as tomato date of delivery is 02/24/2015. She presents with vaginal bleeding. This occurred during sexual activity with her significant other, she states that it is painless, she has no abdominal pain but does not feel like the baby is moving. This started 40 minutes ago, was acute in onset, persistent, nothing makes this better or worse. The patient has no [place] any medical problems, she takes a prenatal vitamin without any other medications.  Patient is a 29 y.o. female presenting with vaginal bleeding. The history is provided by the patient.  Vaginal Bleeding   Past Medical History  Diagnosis Date  . Allergic rhinitis, cause unspecified   . Anal fissure   . ADHD (attention deficit hyperactivity disorder)   . Low back pain   . Atypical chest pain   . Anemia   . Myalgia and myositis, unspecified   . Headache(784.0)   . Mononeuritis of lower limb, unspecified   . Screening examination for pulmonary tuberculosis   . Urinary tract infection, site not specified   . IBS (irritable bowel syndrome)   . History of Clostridium difficile infection     2007  . PVC (premature ventricular contraction)     had negative ECHO  . Hx of varicella   . Kidney stones   . Varicose veins     ablation  . H/O headache following lumbar puncture     done to R/O MS   Past Surgical History  Procedure Laterality Date  . Breast surgery      augmentation  . Endovenous ablation saphenous vein w/ laser     Family History  Problem Relation Age of Onset  . Hypertension Mother   . Hyperlipidemia Father   . Asthma  Father    History  Substance Use Topics  . Smoking status: Never Smoker   . Smokeless tobacco: Not on file  . Alcohol Use: No   OB History    Gravida Para Term Preterm AB TAB SAB Ectopic Multiple Living   1              Review of Systems  Genitourinary: Positive for vaginal bleeding.  All other systems reviewed and are negative.     Allergies  Lexapro; Septra; and Sulfa antibiotics  Home Medications   Prior to Admission medications   Medication Sig Start Date End Date Taking? Authorizing Provider  albuterol (PROVENTIL HFA;VENTOLIN HFA) 108 (90 BASE) MCG/ACT inhaler Inhale 2 puffs into the lungs every 6 (six) hours as needed (pre exercise or as needed for wheezing). 05/17/13   Madelin Headings, MD  amphetamine-dextroamphetamine (ADDERALL) 20 MG tablet Take 1 tablet (20 mg total) by mouth 2 (two) times daily. Patient not taking: Reported on 11/09/2014 06/29/14 06/29/15  Madelin Headings, MD  fluticasone (FLONASE) 50 MCG/ACT nasal spray PLACE 2 SPRAYS INTO THE NOSE DAILY. 02/14/14   Madelin Headings, MD  LORazepam (ATIVAN) 0.5 MG tablet Take 1-2 tablets (0.5-1 mg total) by mouth every 8 (eight) hours as needed for anxiety. Patient not taking: Reported on 11/09/2014 08/10/13   Madelin Headings, MD  montelukast (SINGULAIR) 10 MG tablet TAKE  1 TABLET BY MOUTH AT BEDTIME. Patient not taking: Reported on 11/09/2014    Madelin Headings, MD  Prenatal Vit-Fe Fumarate-FA (PRENATAL PO) Take 1 tablet by mouth daily.    Historical Provider, MD  promethazine (PHENERGAN) 25 MG suppository Place 1 suppository (25 mg total) rectally every 6 (six) hours as needed for nausea or vomiting. Patient not taking: Reported on 11/09/2014 12/22/13   Madelin Headings, MD   BP 114/72 mmHg  Pulse 77  Temp(Src) 97.5 F (36.4 C) (Oral)  Resp 18  SpO2 100%  LMP 12/09/2013 Physical Exam  Constitutional: She appears well-developed and well-nourished. No distress.  HENT:  Head: Normocephalic and atraumatic.  Mouth/Throat:  Oropharynx is clear and moist. No oropharyngeal exudate.  Eyes: Conjunctivae and EOM are normal. Pupils are equal, round, and reactive to light. Right eye exhibits no discharge. Left eye exhibits no discharge. No scleral icterus.  Neck: Normal range of motion. Neck supple. No JVD present. No thyromegaly present.  Cardiovascular: Normal rate, regular rhythm, normal heart sounds and intact distal pulses.  Exam reveals no gallop and no friction rub.   No murmur heard. Pulmonary/Chest: Effort normal and breath sounds normal. No respiratory distress. She has no wheezes. She has no rales.  Abdominal: Soft. Bowel sounds are normal. She exhibits no distension and no mass. There is no tenderness.  Genitourinary:  Gravid uterus, no tenderness. Vaginal exam deferred  Musculoskeletal: Normal range of motion. She exhibits no edema or tenderness.  Lymphadenopathy:    She has no cervical adenopathy.  Neurological: She is alert. Coordination normal.  Skin: Skin is warm and dry. No rash noted. No erythema.  Psychiatric: She has a normal mood and affect. Her behavior is normal.  Nursing note and vitals reviewed.   ED Course  Procedures (including critical care time) Labs Review Labs Reviewed  CBC WITH DIFFERENTIAL/PLATELET - Abnormal; Notable for the following:    WBC 12.6 (*)    Neutro Abs 7.9 (*)    Monocytes Absolute 1.2 (*)    All other components within normal limits  COMPREHENSIVE METABOLIC PANEL - Abnormal; Notable for the following:    Glucose, Bld 109 (*)    Calcium 8.6 (*)    Albumin 3.0 (*)    All other components within normal limits  HCG, QUANTITATIVE, PREGNANCY - Abnormal; Notable for the following:    hCG, Beta Chain, Quant, S 10464 (*)    All other components within normal limits  TYPE AND SCREEN  ABO/RH    Imaging Review US Ob Limited  12/09/2014   OBSTETRICAL ULTRASOUND: This exam was performed within a [facility]. The OB US report was generated in the  AS system, and faxed to the ordering physician.   This report is available in the [employer]. See the [facility] Obstetric US report via the Image Link.    MDM   Final diagnoses:  Vaginal bleeding in pregnancy, third trimester    Bedside ultrasound done by myself shows that the baby is moving, has a fetal heart tones of 140 bpm, discussed care with the on-call OB/GYN doctor Meisinger who has accepted the patient in transfer to the [facility].  Fetal monitoring Saline lock    Eber Hong, MD 12/09/14 2357

## 2014-12-08 NOTE — ED Notes (Signed)
Report to [facility] in MAU

## 2014-12-08 NOTE — MAU Provider Note (Signed)
History     CSN: [id number]  Arrival date and time: 12/08/14 2228   First Provider Initiated Contact with Patient 12/08/14 2350      Chief Complaint  Patient presents with  . Vaginal Bleeding   Vaginal Bleeding The patient's primary symptoms include vaginal bleeding. This is a new problem. The current episode started today (around 2200, immedialty after intercourse). The problem has been gradually improving. The patient is experiencing no pain. She is pregnant. Pertinent negatives include no abdominal pain, constipation, diarrhea, dysuria, fever, frequency, nausea, urgency or vomiting. The vaginal discharge was bloody. The vaginal bleeding is typical of menses. She has not been passing clots. She has not been passing tissue. The symptoms are aggravated by intercourse. She has tried nothing for the symptoms. She is sexually active.     Past Medical History  Diagnosis Date  . Allergic rhinitis, cause unspecified   . Anal fissure   . ADHD (attention deficit hyperactivity disorder)   . Low back pain   . Atypical chest pain   . Anemia   . Myalgia and myositis, unspecified   . Headache(784.0)   . Mononeuritis of lower limb, unspecified   . Screening examination for pulmonary tuberculosis   . Urinary tract infection, site not specified   . IBS (irritable bowel syndrome)   . History of Clostridium difficile infection     2007  . PVC (premature ventricular contraction)     had negative ECHO  . Hx of varicella   . Kidney stones   . Varicose veins     ablation  . H/O headache following lumbar puncture     done to R/O MS    Past Surgical History  Procedure Laterality Date  . Breast surgery      augmentation  . Endovenous ablation saphenous vein w/ laser      Family History  Problem Relation Age of Onset  . Hypertension Mother   . Hyperlipidemia Father   . Asthma Father     History  Substance Use Topics  . Smoking status: Never Smoker   . Smokeless tobacco: Not on file   . Alcohol Use: No    Allergies:  Allergies  Allergen Reactions  . Lexapro [Escitalopram Oxalate] Other (See Comments)    Aggravated migraines  2015  . Septra [Bactrim] Rash  . Sulfa Antibiotics Rash    Prescriptions prior to admission  Medication Sig Dispense Refill Last Dose  . albuterol (PROVENTIL HFA;VENTOLIN HFA) 108 (90 BASE) MCG/ACT inhaler Inhale 2 puffs into the lungs every 6 (six) hours as needed (pre exercise or as needed for wheezing). 1 Inhaler 2 PRN  . amphetamine-dextroamphetamine (ADDERALL) 20 MG tablet Take 1 tablet (20 mg total) by mouth 2 (two) times daily. (Patient not taking: Reported on 11/09/2014) 180 tablet 0 Not Taking at Unknown time  . fluticasone (FLONASE) 50 MCG/ACT nasal spray PLACE 2 SPRAYS INTO THE NOSE DAILY. 48 g 1 PRN  . LORazepam (ATIVAN) 0.5 MG tablet Take 1-2 tablets (0.5-1 mg total) by mouth every 8 (eight) hours as needed for anxiety. (Patient not taking: Reported on 11/09/2014) 20 tablet 0 Not Taking at Unknown time  . montelukast (SINGULAIR) 10 MG tablet TAKE 1 TABLET BY MOUTH AT BEDTIME. (Patient not taking: Reported on 11/09/2014) 90 tablet 1 Not Taking at Unknown time  . Prenatal Vit-Fe Fumarate-FA (PRENATAL PO) Take 1 tablet by mouth daily.   11/09/2014 at Unknown time  . promethazine (PHENERGAN) 25 MG suppository Place 1 suppository (25 mg total)  rectally every 6 (six) hours as needed for nausea or vomiting. (Patient not taking: Reported on 11/09/2014) 12 each 0 Not Taking at Unknown time    Review of Systems  Constitutional: Negative for fever.  Gastrointestinal: Negative for nausea, vomiting, abdominal pain, diarrhea and constipation.  Genitourinary: Positive for vaginal bleeding. Negative for dysuria, urgency and frequency.   Physical Exam   Blood pressure 115/81, pulse 77, temperature 97.5 F (36.4 C), temperature source Oral, resp. rate 18, last menstrual period 12/09/2013, SpO2 98 %.  Physical Exam  Nursing note and vitals  reviewed. Constitutional: She is oriented to person, place, and time. She appears well-developed and well-nourished. No distress.  HENT:  Head: Normocephalic.  Cardiovascular: Normal rate.   Respiratory: Effort normal.  GI: Soft. There is no tenderness. There is no rebound.  Genitourinary:   External: perineum stained with blood  Vagina: moderate amount of blood, and grape sized clot in the vagina. Once blood wiped away, a small trickle seen from the os.  Cervix: pink, smooth, visually closed. Will defer digital exam until after ultrasound  Uterus: AGA    Neurological: She is alert and oriented to person, place, and time.  Skin: Skin is warm and dry.  Psychiatric: She has a normal mood and affect.    FHT: 145, moderate with 15x15 accels, no decels [place]oco: no UCs  Results for orders placed or performed during the hospital encounter of 12/08/14 (from the past 24 hour(s))  CBC with Differential     Status: Abnormal   Collection Time: 12/08/14 10:40 PM  Result Value Ref Range   WBC 12.6 (H) 4.0 - 10.5 K/uL   RBC 4.15 3.87 - 5.11 MIL/uL   Hemoglobin 13.2 12.0 - 15.0 g/dL   HCT 16.1 09.6 - 04.5 %   MCV 90.6 78.0 - 100.0 fL   MCH 31.8 26.0 - 34.0 pg   MCHC 35.1 30.0 - 36.0 g/dL   RDW 40.9 81.1 - 91.4 %   Platelets 219 150 - 400 K/uL   Neutrophils Relative % 63 43 - 77 %   Neutro Abs 7.9 (H) 1.7 - 7.7 K/uL   Lymphocytes Relative 27 12 - 46 %   Lymphs Abs 3.3 0.7 - 4.0 K/uL   Monocytes Relative 9 3 - 12 %   Monocytes Absolute 1.2 (H) 0.1 - 1.0 K/uL   Eosinophils Relative 1 0 - 5 %   Eosinophils Absolute 0.2 0.0 - 0.7 K/uL   Basophils Relative 0 0 - 1 %   Basophils Absolute 0.0 0.0 - 0.1 K/uL  Comprehensive metabolic panel     Status: Abnormal   Collection Time: 12/08/14 10:40 PM  Result Value Ref Range   Sodium 137 135 - 145 mmol/L   Potassium 3.9 3.5 - 5.1 mmol/L   Chloride 104 101 - 111 mmol/L   CO2 24 22 - 32 mmol/L   Glucose, Bld 109 (H) 65 - 99 mg/dL   BUN 10 6 - 20  mg/dL   Creatinine, Ser 7.82 0.44 - 1.00 mg/dL   Calcium 8.6 (L) 8.9 - 10.3 mg/dL   Total Protein 6.6 6.5 - 8.1 g/dL   Albumin 3.0 (L) 3.5 - 5.0 g/dL   AST 21 15 - 41 U/L   ALT 14 14 - 54 U/L   Alkaline Phosphatase 71 38 - 126 U/L   Total Bilirubin 0.4 0.3 - 1.2 mg/dL   GFR calc non Af Amer >60 >60 mL/min   GFR calc Af Amer >60 >60 mL/min  Anion gap 9 5 - 15  hCG, quantitative, pregnancy     Status: Abnormal   Collection Time: 12/08/14 10:40 PM  Result Value Ref Range   hCG, Beta Chain, Quant, S 10464 (H) <5 mIU/mL  Type and screen for Red Blood Exchange     Status: None   Collection Time: 12/08/14 10:40 PM  Result Value Ref Range   ABO/RH(D) B POS    Antibody Screen NEG    Sample Expiration 12/11/2014   ABO/Rh     Status: None   Collection Time: 12/08/14 10:40 PM  Result Value Ref Range   ABO/RH(D) B POS    No rh immune globuloin NOT A RH IMMUNE GLOBULIN CANDIDATE, PT RH POSITIVE    [place]: Breech AFI 16.17 Cervix: 3.23 cm No previa   MAU Course  Procedures  MDM 0050: D/W Dr. Jackelyn Knife, ok for dc home.   Assessment and Plan   1. Vaginal bleeding in pregnancy, third trimester    DC home Comfort measures reviewed  3rd Trimester precautions  Bleeding precautions Pelvic rest  PTL precautions  Fetal kick counts RX: none  Return to MAU as needed FU with OB as planned  Follow-up Information    Follow up with Oliver Pila, MD.   Specialty:  Obstetrics and Gynecology   Why:  As scheduled   Contact information:   [address] [place] [address] [phone]         Tawnya Crook 12/09/2014, 12:50 AM

## 2014-12-08 NOTE — ED Notes (Signed)
[facility] enroute

## 2014-12-09 ENCOUNTER — Inpatient Hospital Stay (HOSPITAL_COMMUNITY): Payer: PRIVATE HEALTH INSURANCE

## 2014-12-09 DIAGNOSIS — O4690 Antepartum hemorrhage, unspecified, unspecified trimester: Secondary | ICD-10-CM | POA: Insufficient documentation

## 2014-12-09 DIAGNOSIS — O4693 Antepartum hemorrhage, unspecified, third trimester: Secondary | ICD-10-CM | POA: Diagnosis not present

## 2014-12-09 DIAGNOSIS — Z3A29 29 weeks gestation of pregnancy: Secondary | ICD-10-CM | POA: Insufficient documentation

## 2014-12-09 LAB — TYPE AND SCREEN
ABO/RH(D): B POS
Antibody Screen: NEGATIVE

## 2014-12-09 NOTE — Discharge Instructions (Signed)
Third Trimester of Pregnancy The third trimester is from week 29 through week 42, months 7 through 9. The third trimester is a time when the fetus is growing rapidly. At the end of the ninth month, the fetus is about 20 inches in length and weighs 6-10 pounds.  BODY CHANGES Your body goes through many changes during pregnancy. The changes vary from woman to woman.   Your weight will continue to increase. You can expect to gain 25-35 pounds (11-16 kg) by the end of the pregnancy.  You may begin to get stretch marks on your hips, abdomen, and breasts.  You may urinate more often because the fetus is moving lower into your pelvis and pressing on your bladder.  You may develop or continue to have heartburn as a result of your pregnancy.  You may develop constipation because certain hormones are causing the muscles that push waste through your intestines to slow down.  You may develop hemorrhoids or swollen, bulging veins (varicose veins).  You may have pelvic pain because of the weight gain and pregnancy hormones relaxing your joints between the bones in your pelvis. Backaches may result from overexertion of the muscles supporting your posture.  You may have changes in your hair. These can include thickening of your hair, rapid growth, and changes in texture. Some women also have hair loss during or after pregnancy, or hair that feels dry or thin. Your hair will most likely return to normal after your baby is born.  Your breasts will continue to grow and be tender. A yellow discharge may leak from your breasts called colostrum.  Your belly button may stick out.  You may feel short of breath because of your expanding uterus.  You may notice the fetus "dropping," or moving lower in your abdomen.  You may have a bloody mucus discharge. This usually occurs a few days to a week before labor begins.  Your cervix becomes thin and soft (effaced) near your due date. WHAT TO EXPECT AT YOUR PRENATAL  EXAMS  You will have prenatal exams every 2 weeks until week 36. Then, you will have weekly prenatal exams. During a routine prenatal visit:  You will be weighed to make sure you and the fetus are growing normally.  Your blood pressure is taken.  Your abdomen will be measured to track your baby's growth.  The fetal heartbeat will be listened to.  Any test results from the previous visit will be discussed.  You may have a cervical check near your due date to see if you have effaced. At around 36 weeks, your caregiver will check your cervix. At the same time, your caregiver will also perform a test on the secretions of the vaginal tissue. This test is to determine if a type of bacteria, Group B streptococcus, is present. Your caregiver will explain this further. Your caregiver may ask you:  What your birth plan is.  How you are feeling.  If you are feeling the baby move.  If you have had any abnormal symptoms, such as leaking fluid, bleeding, severe headaches, or abdominal cramping.  If you have any questions. Other tests or screenings that may be performed during your third trimester include:  Blood tests that check for low iron levels (anemia).  Fetal testing to check the health, activity level, and growth of the fetus. Testing is done if you have certain medical conditions or if there are problems during the pregnancy. FALSE LABOR You may feel small, irregular contractions that   eventually go away. These are called Braxton Hicks contractions, or false labor. Contractions may last for hours, days, or even weeks before true labor sets in. If contractions come at regular intervals, intensify, or become painful, it is best to be seen by your caregiver.  SIGNS OF LABOR   Menstrual-like cramps.  Contractions that are 5 minutes apart or less.  Contractions that start on the top of the uterus and spread down to the lower abdomen and back.  A sense of increased pelvic pressure or back  pain.  A watery or bloody mucus discharge that comes from the vagina. If you have any of these signs before the 37th week of pregnancy, call your caregiver right away. You need to go to the hospital to get checked immediately. HOME CARE INSTRUCTIONS   Avoid all smoking, herbs, alcohol, and unprescribed drugs. These chemicals affect the formation and growth of the baby.  Follow your caregiver's instructions regarding medicine use. There are medicines that are either safe or unsafe to take during pregnancy.  Exercise only as directed by your caregiver. Experiencing uterine cramps is a good sign to stop exercising.  Continue to eat regular, healthy meals.  Wear a good support bra for breast tenderness.  Do not use hot tubs, steam rooms, or saunas.  Wear your seat belt at all times when driving.  Avoid raw meat, uncooked cheese, cat litter boxes, and soil used by cats. These carry germs that can cause birth defects in the baby.  Take your prenatal vitamins.  Try taking a stool softener (if your caregiver approves) if you develop constipation. Eat more high-fiber foods, such as fresh vegetables or fruit and whole grains. Drink plenty of fluids to keep your urine clear or pale yellow.  Take warm sitz baths to soothe any pain or discomfort caused by hemorrhoids. Use hemorrhoid cream if your caregiver approves.  If you develop varicose veins, wear support hose. Elevate your feet for 15 minutes, 3-4 times a day. Limit salt in your diet.  Avoid heavy lifting, wear low heal shoes, and practice good posture.  Rest a lot with your legs elevated if you have leg cramps or low back pain.  Visit your dentist if you have not gone during your pregnancy. Use a soft toothbrush to brush your teeth and be gentle when you floss.  A sexual relationship may be continued unless your caregiver directs you otherwise.  Do not travel far distances unless it is absolutely necessary and only with the approval  of your caregiver.  Take prenatal classes to understand, practice, and ask questions about the labor and delivery.  Make a trial run to the hospital.  Pack your hospital bag.  Prepare the baby's nursery.  Continue to go to all your prenatal visits as directed by your caregiver. SEEK MEDICAL CARE IF:  You are unsure if you are in labor or if your water has broken.  You have dizziness.  You have mild pelvic cramps, pelvic pressure, or nagging pain in your abdominal area.  You have persistent nausea, vomiting, or diarrhea.  You have a bad smelling vaginal discharge.  You have pain with urination. SEEK IMMEDIATE MEDICAL CARE IF:   You have a fever.  You are leaking fluid from your vagina.  You have spotting or bleeding from your vagina.  You have severe abdominal cramping or pain.  You have rapid weight loss or gain.  You have shortness of breath with chest pain.  You notice sudden or extreme swelling   of your face, hands, ankles, feet, or legs.  You have not felt your baby move in over an hour.  You have severe headaches that do not go away with medicine.  You have vision changes. Document Released: 06/11/2001 Document Revised: 06/22/2013 Document Reviewed: 08/18/2012 Exit[employer] Patient Information 2015 [employer]. This information is not intended to replace advice given to you by your health care provider. Make sure you discuss any questions you have with your health care provider.  

## 2015-01-10 ENCOUNTER — Ambulatory Visit (INDEPENDENT_AMBULATORY_CARE_PROVIDER_SITE_OTHER): Payer: PRIVATE HEALTH INSURANCE | Admitting: Family Medicine

## 2015-01-10 ENCOUNTER — Encounter: Payer: Self-pay | Admitting: Family Medicine

## 2015-01-10 VITALS — BP 106/73 | HR 77 | Temp 98.6°F | Ht 67.5 in | Wt 231.0 lb

## 2015-01-10 DIAGNOSIS — B351 Tinea unguium: Secondary | ICD-10-CM

## 2015-01-10 NOTE — Progress Notes (Signed)
Pre visit review using our clinic review tool, if applicable. No additional management support is needed unless otherwise documented below in the visit note. 

## 2015-01-10 NOTE — Progress Notes (Signed)
   Subjective:    Patient ID: Alexandra [name], female    DOB: 09/28/1985, 29 y.o.   MRN: [id number]  HPI Here asking about her left great toenail. She developed a fungal infection in the nail about one year ago, and the nail fell off a year ago. Since then it has bee growing back in but it is thick, yellow, and hard to trim. It is not painful. She is covering it with a Bandaid. She is pregnant.   Review of Systems  Constitutional: Negative.   Skin: Positive for color change.       Objective:   Physical Exam  Constitutional: She appears well-developed and well-nourished.  Musculoskeletal:  The left great toenail is yellow, thick,and curved. Not tender          Assessment & Plan:  Onychomycosis. We agreed to forego any medications for this while she is pregnant, so she will try using Vicks Vaporub or soaking the nail in vinegar. Once she delivers she can use Terbinafine tablets if needed.

## 2015-01-30 LAB — OB RESULTS CONSOLE GBS: GBS: NEGATIVE

## 2015-02-23 ENCOUNTER — Encounter (HOSPITAL_COMMUNITY): Payer: Self-pay | Admitting: *Deleted

## 2015-02-23 ENCOUNTER — Telehealth (HOSPITAL_COMMUNITY): Payer: Self-pay | Admitting: *Deleted

## 2015-02-23 NOTE — Telephone Encounter (Signed)
Preadmission screen  

## 2015-02-28 NOTE — H&P (Signed)
Alexandra [name] is a 29 y.o. female G1P0 at 62 5/7 weeks (EDD 02/24/15 by 7 week [place] and unsure LMP) presenting for IOL post due date.  Prenatal care uncomplicated.  Maternal Medical History:  Contractions: Frequency: irregular.   Perceived severity is mild.    Fetal activity: Perceived fetal activity is normal.    Prenatal Complications - Diabetes: none.    OB History    Gravida Para Term Preterm AB TAB SAB Ectopic Multiple Living   1              Past Medical History  Diagnosis Date  . Allergic rhinitis, cause unspecified   . Anal fissure   . ADHD (attention deficit hyperactivity disorder)   . Low back pain   . Atypical chest pain   . Anemia   . Myalgia and myositis, unspecified   . Headache(784.0)   . Mononeuritis of lower limb, unspecified   . Screening examination for pulmonary tuberculosis   . Urinary tract infection, site not specified   . IBS (irritable bowel syndrome)   . History of Clostridium difficile infection     2007  . PVC (premature ventricular contraction)     had negative ECHO  . Hx of varicella   . Kidney stones   . Varicose veins     ablation  . H/O headache following lumbar puncture     done to R/O MS  . 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 Interstitial cystitis    Past Surgical History  Procedure Laterality Date  . Breast surgery      augmentation  . Endovenous ablation saphenous vein w/ laser     Family History: family history includes Asthma in her father; Hyperlipidemia in her father; Hypertension in her mother. Social History:  reports that she has never smoked. She does not have any smokeless tobacco history on file. She reports that she does not drink alcohol or use illicit drugs.   Prenatal Transfer Tool  Maternal Diabetes: No Genetic Screening: Normal Maternal Ultrasounds/Referrals: Normal Fetal Ultrasounds or other Referrals:  None Maternal Substance Abuse:  No Significant Maternal Medications:  None Significant Maternal  Lab Results:  None Other Comments:  None  ROS    Last menstrual period 12/09/2013. Maternal Exam:  Uterine Assessment: Contraction strength is mild.  Contraction frequency is irregular.   Abdomen: Patient reports no abdominal tenderness. Fetal presentation: vertex  Introitus: Normal vulva. Normal vagina.    Physical Exam  Constitutional: She appears well-developed and well-nourished.  Cardiovascular: Normal rate and regular rhythm.   Respiratory: Effort normal.  GI: Soft.  Genitourinary: Vagina normal and uterus normal.  Neurological: She is alert.  Psychiatric: She has a normal mood and affect.    Prenatal labs: ABO, Rh: --/--/B POS, B POS (06/09 2240) Antibody: NEG (06/09 2240) Rubella: Immune (02/08 0000) RPR: Nonreactive (02/08 0000)  HBsAg: Negative (02/08 0000)  HIV: Non-reactive (02/08 0000)  GBS: Negative (08/01 0000)  One hour GCT 142 Three hour GTT WNL First trimester screen and AFP WNL CF negative  Assessment/Plan: Pt for postdates induction.  Plan cervical ripening with cytotec and then AROM and pitocin.   Oliver Pila 02/28/2015, 5:50 PM

## 2015-03-01 ENCOUNTER — Inpatient Hospital Stay (HOSPITAL_COMMUNITY)
Admission: RE | Admit: 2015-03-01 | Discharge: 2015-03-03 | DRG: 775 | Disposition: A | Discharge status: Home / Self Care | Payer: PRIVATE HEALTH INSURANCE | Source: Ambulatory Visit | Attending: Obstetrics and Gynecology | Admitting: Obstetrics and Gynecology

## 2015-03-01 ENCOUNTER — Encounter (HOSPITAL_COMMUNITY): Payer: Self-pay

## 2015-03-01 ENCOUNTER — Inpatient Hospital Stay (HOSPITAL_COMMUNITY): Payer: PRIVATE HEALTH INSURANCE | Admitting: Anesthesiology

## 2015-03-01 DIAGNOSIS — R221 Localized swelling, mass and lump, neck: Secondary | ICD-10-CM

## 2015-03-01 DIAGNOSIS — Z87440 Personal history of urinary (tract) infections: Secondary | ICD-10-CM

## 2015-03-01 DIAGNOSIS — Z87442 Personal history of urinary calculi: Secondary | ICD-10-CM

## 2015-03-01 DIAGNOSIS — F909 Attention-deficit hyperactivity disorder, unspecified type: Secondary | ICD-10-CM

## 2015-03-01 DIAGNOSIS — Z8249 Family history of ischemic heart disease and other diseases of the circulatory system: Secondary | ICD-10-CM

## 2015-03-01 DIAGNOSIS — Z3A40 40 weeks gestation of pregnancy: Secondary | ICD-10-CM | POA: Diagnosis present

## 2015-03-01 DIAGNOSIS — O480 Post-term pregnancy: Principal | ICD-10-CM | POA: Diagnosis present

## 2015-03-01 DIAGNOSIS — Z3A29 29 weeks gestation of pregnancy: Secondary | ICD-10-CM

## 2015-03-01 DIAGNOSIS — R079 Chest pain, unspecified: Secondary | ICD-10-CM

## 2015-03-01 DIAGNOSIS — I8390 Asymptomatic varicose veins of unspecified lower extremity: Secondary | ICD-10-CM

## 2015-03-01 DIAGNOSIS — F4322 Adjustment disorder with anxiety: Secondary | ICD-10-CM

## 2015-03-01 DIAGNOSIS — N3010 Interstitial cystitis (chronic) without hematuria: Secondary | ICD-10-CM

## 2015-03-01 DIAGNOSIS — O209 Hemorrhage in early pregnancy, unspecified: Secondary | ICD-10-CM

## 2015-03-01 DIAGNOSIS — Z79899 Other long term (current) drug therapy: Secondary | ICD-10-CM

## 2015-03-01 DIAGNOSIS — N200 Calculus of kidney: Secondary | ICD-10-CM

## 2015-03-01 DIAGNOSIS — M1990 Unspecified osteoarthritis, unspecified site: Secondary | ICD-10-CM

## 2015-03-01 DIAGNOSIS — M419 Scoliosis, unspecified: Secondary | ICD-10-CM

## 2015-03-01 DIAGNOSIS — Z87898 Personal history of other specified conditions: Secondary | ICD-10-CM

## 2015-03-01 DIAGNOSIS — K589 Irritable bowel syndrome without diarrhea: Secondary | ICD-10-CM

## 2015-03-01 LAB — TYPE AND SCREEN
ABO/RH(D): B POS
ANTIBODY SCREEN: NEGATIVE

## 2015-03-01 LAB — CBC
HEMATOCRIT: 38.9 % (ref 36.0–46.0)
HEMOGLOBIN: 13.7 g/dL (ref 12.0–15.0)
MCH: 32.2 pg (ref 26.0–34.0)
MCHC: 35.2 g/dL (ref 30.0–36.0)
MCV: 91.3 fL (ref 78.0–100.0)
Platelets: 214 10*3/uL (ref 150–400)
RBC: 4.26 MIL/uL (ref 3.87–5.11)
RDW: 13.8 % (ref 11.5–15.5)
WBC: 12.2 10*3/uL — ABNORMAL HIGH (ref 4.0–10.5)

## 2015-03-01 LAB — RPR: RPR: NONREACTIVE

## 2015-03-01 LAB — ABO/RH: ABO/RH(D): B POS

## 2015-03-01 MED ORDER — DIPHENHYDRAMINE HCL 50 MG/ML IJ SOLN: 12.5000 mg | INTRAMUSCULAR | Status: DC | PRN

## 2015-03-01 MED ORDER — MISOPROSTOL 25 MCG QUARTER TABLET
25.0000 ug | ORAL_TABLET | ORAL | Status: DC | PRN
  Administered 2015-03-01: 25 ug via VAGINAL
  Filled 2015-03-01: qty 0.25
  Filled 2015-03-01: qty 1

## 2015-03-01 MED ORDER — LACTATED RINGERS IV SOLN
INTRAVENOUS | Status: DC
  Administered 2015-03-01 (×2): via INTRAVENOUS

## 2015-03-01 MED ORDER — BUTORPHANOL TARTRATE 1 MG/ML IJ SOLN: 1.0000 mg | INTRAMUSCULAR | Status: DC | PRN

## 2015-03-01 MED ORDER — OXYCODONE-ACETAMINOPHEN 5-325 MG PO TABS: 2.0000 | ORAL_TABLET | ORAL | Status: DC | PRN

## 2015-03-01 MED ORDER — LACTATED RINGERS IV SOLN
500.0000 mL | INTRAVENOUS | Status: DC | PRN
  Administered 2015-03-01: 300 mL via INTRAVENOUS
  Administered 2015-03-01: 500 mL via INTRAVENOUS

## 2015-03-01 MED ORDER — PHENYLEPHRINE 40 MCG/ML (10ML) SYRINGE FOR IV PUSH (FOR BLOOD PRESSURE SUPPORT): 80.0000 ug | PREFILLED_SYRINGE | INTRAVENOUS | Status: DC | PRN

## 2015-03-01 MED ORDER — CITRIC ACID-SODIUM CITRATE 334-500 MG/5ML PO SOLN: 30.0000 mL | ORAL | Status: DC | PRN

## 2015-03-01 MED ORDER — OXYTOCIN BOLUS FROM INFUSION
500.0000 mL | INTRAVENOUS | Status: DC
  Administered 2015-03-02: 500 mL via INTRAVENOUS

## 2015-03-01 MED ORDER — LIDOCAINE HCL (PF) 1 % IJ SOLN
30.0000 mL | INTRAMUSCULAR | Status: DC | PRN
  Filled 2015-03-01: qty 30

## 2015-03-01 MED ORDER — ONDANSETRON HCL 4 MG/2ML IJ SOLN
4.0000 mg | Freq: Four times a day (QID) | INTRAMUSCULAR | Status: DC | PRN
  Administered 2015-03-01: 4 mg via INTRAVENOUS
  Filled 2015-03-01: qty 2

## 2015-03-01 MED ORDER — FENTANYL 2.5 MCG/ML BUPIVACAINE 1/10 % EPIDURAL INFUSION (WH - ANES)
14.0000 mL/h | INTRAMUSCULAR | Status: DC | PRN
  Filled 2015-03-01: qty 125

## 2015-03-01 MED ORDER — OXYTOCIN 40 UNITS IN LACTATED RINGERS INFUSION - SIMPLE MED
62.5000 mL/h | INTRAVENOUS | Status: DC
  Filled 2015-03-01: qty 1000

## 2015-03-01 MED ORDER — TERBUTALINE SULFATE 1 MG/ML IJ SOLN
0.2500 mg | Freq: Once | INTRAMUSCULAR | Status: DC | PRN
  Filled 2015-03-01: qty 1

## 2015-03-01 MED ORDER — OXYTOCIN 40 UNITS IN LACTATED RINGERS INFUSION - SIMPLE MED
1.0000 m[IU]/min | INTRAVENOUS | Status: DC
  Administered 2015-03-01: 2 m[IU]/min via INTRAVENOUS

## 2015-03-01 MED ORDER — FENTANYL 2.5 MCG/ML BUPIVACAINE 1/10 % EPIDURAL INFUSION (WH - ANES)
15.0000 mL/h | INTRAMUSCULAR | Status: DC | PRN
  Administered 2015-03-01 (×4): 15 mL/h via EPIDURAL
  Filled 2015-03-01 (×3): qty 125

## 2015-03-01 MED ORDER — SODIUM BICARBONATE 8.4 % IV SOLN
INTRAVENOUS | Status: DC | PRN
  Administered 2015-03-01 (×2): 5 mL via EPIDURAL

## 2015-03-01 MED ORDER — EPHEDRINE 5 MG/ML INJ
10.0000 mg | INTRAVENOUS | Status: DC | PRN
  Filled 2015-03-01: qty 2

## 2015-03-01 MED ORDER — BUPIVACAINE HCL (PF) 0.25 % IJ SOLN
INTRAMUSCULAR | Status: DC | PRN
  Administered 2015-03-01 (×2): 5 mL

## 2015-03-01 MED ORDER — PHENYLEPHRINE 40 MCG/ML (10ML) SYRINGE FOR IV PUSH (FOR BLOOD PRESSURE SUPPORT)
80.0000 ug | PREFILLED_SYRINGE | INTRAVENOUS | Status: DC | PRN
  Filled 2015-03-01: qty 20
  Filled 2015-03-01: qty 2

## 2015-03-01 MED ORDER — OXYCODONE-ACETAMINOPHEN 5-325 MG PO TABS: 1.0000 | ORAL_TABLET | ORAL | Status: DC | PRN

## 2015-03-01 MED ORDER — LIDOCAINE HCL (PF) 1 % IJ SOLN
INTRAMUSCULAR | Status: DC | PRN
  Administered 2015-03-01 (×4): 5 mL via EPIDURAL

## 2015-03-01 MED ORDER — ZOLPIDEM TARTRATE 5 MG PO TABS: 5.0000 mg | ORAL_TABLET | Freq: Every evening | ORAL | Status: DC | PRN

## 2015-03-01 MED ORDER — ACETAMINOPHEN 325 MG PO TABS
650.0000 mg | ORAL_TABLET | ORAL | Status: DC | PRN
  Administered 2015-03-02: 650 mg via ORAL
  Filled 2015-03-01: qty 2

## 2015-03-01 NOTE — Progress Notes (Signed)
Patient ID: Alexandra [name], female   DOB: 05-19-1986, 29 y.o.   MRN: [id number]  Pt feeling some pressure FHR with a persistent deceleration to 90-100 that improved with position change, O2 and stopping pitocin  FHR now back to category 1 Cervix 90/8-9/-1 OP  Will restart pitocin and follow progress

## 2015-03-01 NOTE — Progress Notes (Signed)
Patient ID: Alexandra [name], female   DOB: 1986/06/29, 29 y.o.   MRN: [id number] Pt comfortable with epidural afeb vss FHR Category 1 Adequate MVU on IUPC Cervix per RN 80/6/-1  Pt placed in exaggerated sims Follow progress

## 2015-03-01 NOTE — Progress Notes (Signed)
Patient ID: Alexandra [name], female   DOB: 27-May-1986, 29 y.o.   MRN: [id number] Pt had second epidural and working better with lidocaine afeb vss FHR category 1 Cervix 80/4-5/-1 OP Adequate contractions with IUPC  Will try exaggerated sims position.

## 2015-03-01 NOTE — Progress Notes (Signed)
Patient admitted to room 176 via walking

## 2015-03-01 NOTE — Anesthesia Procedure Notes (Addendum)
Epidural Patient location during procedure: OB Start time: 03/01/2015 11:40 AM  Staffing Anesthesiologist: Mal Amabile Performed by: anesthesiologist   Preanesthetic Checklist Completed: patient identified, site marked, surgical consent, pre-op evaluation, timeout performed, IV checked, risks and benefits discussed and monitors and equipment checked  Epidural Patient position: sitting Prep: site prepped and draped and DuraPrep Patient monitoring: continuous pulse ox and blood pressure Approach: midline Location: L3-L4 Injection technique: LOR air  Needle:  Needle type: Tuohy  Needle gauge: 17 G Needle length: 9 cm and 9 Needle insertion depth: 5 cm cm Catheter type: closed end flexible Catheter size: 19 Gauge Catheter at skin depth: 10 cm Test dose: negative and Other  Assessment Events: blood not aspirated, injection not painful, no injection resistance, negative IV test and no paresthesia  Additional Notes Patient identified. Risks and benefits discussed including failed block, incomplete  Pain control, post dural puncture headache, nerve damage, paralysis, blood pressure Changes, nausea, vomiting, reactions to medications-both toxic and allergic and post Partum back pain. All questions were answered. Patient expressed understanding and wished to proceed. Sterile technique was used throughout procedure. Epidural site was Dressed with sterile barrier dressing. No paresthesias, signs of intravascular injection Or signs of intrathecal spread were encountered.  Patient was more comfortable after the epidural was dosed. Please see RN's note for documentation of vital signs and FHR which are stable.   Epidural Patient location during procedure: OB Start time: 03/01/2015 2:05 PM  Staffing Anesthesiologist: Mal Amabile Performed by: anesthesiologist   Preanesthetic Checklist Completed: patient identified, site marked, surgical consent, pre-op evaluation, timeout  performed, IV checked, risks and benefits discussed and monitors and equipment checked  Epidural Patient position: sitting Prep: site prepped and draped and DuraPrep Patient monitoring: continuous pulse ox and blood pressure Approach: midline Location: L3-L4 Injection technique: LOR air  Needle:  Needle type: Tuohy  Needle gauge: 17 G Needle length: 9 cm and 9 Needle insertion depth: 5 cm cm Catheter type: closed end flexible Catheter size: 19 Gauge Catheter at skin depth: 10 cm Test dose: negative and Other  Assessment Events: blood not aspirated, injection not painful, no injection resistance, negative IV test and no paresthesia  Additional Notes Patient identified. Risks and benefits discussed including failed block, incomplete  Pain control, post dural puncture headache, nerve damage, paralysis, blood pressure Changes, nausea, vomiting, reactions to medications-both toxic and allergic and post Partum back pain. All questions were answered. Patient expressed understanding and wished to proceed. Sterile technique was used throughout procedure. Epidural site was Dressed with sterile barrier dressing. No paresthesias, signs of intravascular injection Or signs of intrathecal spread were encountered.  Patient was more comfortable after the epidural was dosed. Please see RN's note for documentation of vital signs and FHR which are stable.

## 2015-03-01 NOTE — Progress Notes (Signed)
Patient ID: Alexandra [name], female   DOB: Jan 10, 1986, 29 y.o.   MRN: [id number] Pt admitted about 4am and cytotec placed about 7am afeb vss FHR Category 1  Cervix 2/50/-2  AROM clear  Start Pitocin at 11am and follow progress.

## 2015-03-01 NOTE — Anesthesia Preprocedure Evaluation (Signed)
Anesthesia Evaluation    Airway Mallampati: III  TM Distance: >3 FB Neck ROM: Full    Dental no notable dental hx. (+) Teeth Intact   Pulmonary  breath sounds clear to auscultation  Pulmonary exam normal       Cardiovascular negative cardio ROS Normal cardiovascular examRhythm:Regular Rate:Normal     Neuro/Psych  Headaches, PSYCHIATRIC DISORDERS ADHD Neuromuscular disease    GI/Hepatic GERD-  Medicated and Controlled,  Endo/Other  Obesity  Renal/GU Renal diseaseHx/o renal calculi   IC    Musculoskeletal  (+) Arthritis -, Myalgia Chronic low back pain   Abdominal (+) + obese,   Peds  Hematology  (+) anemia ,   Anesthesia Other Findings   Reproductive/Obstetrics (+) Pregnancy                             Anesthesia Physical Anesthesia Plan  ASA: II  Anesthesia Plan: Epidural   Post-op Pain Management:    Induction:   Airway Management Planned: Natural Airway  Additional Equipment:   Intra-op Plan:   Post-operative Plan:   Informed Consent: I have reviewed the patients History and Physical, chart, labs and discussed the procedure including the risks, benefits and alternatives for the proposed anesthesia with the patient or authorized representative who has indicated his/her understanding and acceptance.     Plan Discussed with: Anesthesiologist  Anesthesia Plan Comments:         Anesthesia Quick Evaluation

## 2015-03-01 NOTE — Progress Notes (Signed)
Patient ID: Alexandra [name], female   DOB: 1985/11/21, 29 y.o.   MRN: [id number] Pt received epidural and only got one-sided relief, anesthesia to reevaluate if needs to be replaced FHR category 1  Will check cervix when more comfortable.

## 2015-03-02 ENCOUNTER — Encounter (HOSPITAL_COMMUNITY): Payer: Self-pay

## 2015-03-02 LAB — CBC
HCT: 34.3 % — ABNORMAL LOW (ref 36.0–46.0)
HEMOGLOBIN: 11.7 g/dL — AB (ref 12.0–15.0)
MCH: 31.2 pg (ref 26.0–34.0)
MCHC: 34.1 g/dL (ref 30.0–36.0)
MCV: 91.5 fL (ref 78.0–100.0)
Platelets: 184 10*3/uL (ref 150–400)
RBC: 3.75 MIL/uL — AB (ref 3.87–5.11)
RDW: 13.8 % (ref 11.5–15.5)
WBC: 20.8 10*3/uL — ABNORMAL HIGH (ref 4.0–10.5)

## 2015-03-02 MED ORDER — ONDANSETRON HCL 4 MG PO TABS: 4.0000 mg | ORAL_TABLET | ORAL | Status: DC | PRN

## 2015-03-02 MED ORDER — PRENATAL MULTIVITAMIN CH
1.0000 | ORAL_TABLET | Freq: Every day | ORAL | Status: DC
  Administered 2015-03-02: 1 via ORAL
  Filled 2015-03-02: qty 1

## 2015-03-02 MED ORDER — BENZOCAINE-MENTHOL 20-0.5 % EX AERO
1.0000 "application " | INHALATION_SPRAY | Freq: Four times a day (QID) | CUTANEOUS | Status: DC | PRN
  Filled 2015-03-02: qty 56

## 2015-03-02 MED ORDER — TETANUS-DIPHTH-ACELL PERTUSSIS 5-2.5-18.5 LF-MCG/0.5 IM SUSP: 0.5000 mL | Freq: Once | INTRAMUSCULAR | Status: DC

## 2015-03-02 MED ORDER — OXYCODONE-ACETAMINOPHEN 5-325 MG PO TABS: 2.0000 | ORAL_TABLET | ORAL | Status: DC | PRN

## 2015-03-02 MED ORDER — MONTELUKAST SODIUM 10 MG PO TABS
10.0000 mg | ORAL_TABLET | Freq: Every day | ORAL | Status: DC
  Filled 2015-03-02: qty 1

## 2015-03-02 MED ORDER — DIBUCAINE 1 % RE OINT: 1.0000 "application " | TOPICAL_OINTMENT | RECTAL | Status: DC | PRN

## 2015-03-02 MED ORDER — SENNOSIDES-DOCUSATE SODIUM 8.6-50 MG PO TABS
2.0000 | ORAL_TABLET | ORAL | Status: DC
  Administered 2015-03-03: 2 via ORAL
  Filled 2015-03-02: qty 2

## 2015-03-02 MED ORDER — ONDANSETRON HCL 4 MG/2ML IJ SOLN: 4.0000 mg | INTRAMUSCULAR | Status: DC | PRN

## 2015-03-02 MED ORDER — WITCH HAZEL-GLYCERIN EX PADS: 1.0000 "application " | MEDICATED_PAD | CUTANEOUS | Status: DC | PRN

## 2015-03-02 MED ORDER — ZOLPIDEM TARTRATE 5 MG PO TABS: 5.0000 mg | ORAL_TABLET | Freq: Every evening | ORAL | Status: DC | PRN

## 2015-03-02 MED ORDER — OXYCODONE-ACETAMINOPHEN 5-325 MG PO TABS: 1.0000 | ORAL_TABLET | ORAL | Status: DC | PRN

## 2015-03-02 MED ORDER — FLUTICASONE PROPIONATE 50 MCG/ACT NA SUSP
2.0000 | Freq: Every day | NASAL | Status: DC
  Filled 2015-03-02: qty 16

## 2015-03-02 MED ORDER — IBUPROFEN 600 MG PO TABS
600.0000 mg | ORAL_TABLET | Freq: Four times a day (QID) | ORAL | Status: DC
  Administered 2015-03-02 – 2015-03-03 (×4): 600 mg via ORAL
  Filled 2015-03-02 (×4): qty 1

## 2015-03-02 MED ORDER — ACETAMINOPHEN 325 MG PO TABS: 650.0000 mg | ORAL_TABLET | ORAL | Status: DC | PRN

## 2015-03-02 MED ORDER — SIMETHICONE 80 MG PO CHEW: 80.0000 mg | CHEWABLE_TABLET | ORAL | Status: DC | PRN

## 2015-03-02 MED ORDER — INFLUENZA VAC SPLIT QUAD 0.5 ML IM SUSY
0.5000 mL | PREFILLED_SYRINGE | INTRAMUSCULAR | Status: AC
  Administered 2015-03-03: 0.5 mL via INTRAMUSCULAR
  Filled 2015-03-02: qty 0.5

## 2015-03-02 MED ORDER — LANOLIN HYDROUS EX OINT: TOPICAL_OINTMENT | CUTANEOUS | Status: DC | PRN

## 2015-03-02 NOTE — Progress Notes (Signed)
Post Partum Day 1 Subjective: no complaints and tolerating PO  Objective: Blood pressure 113/64, pulse 83, temperature 98 F (36.7 C), temperature source Oral, resp. rate 18, height  (1.778 m), weight 108.863 kg (240 lb), last menstrual period 12/09/2013, SpO2 100 %, unknown if currently breastfeeding.  Physical Exam:  General: alert and cooperative Lochia: appropriate Uterine Fundus: firm    Recent Labs  03/01/15 0638 03/02/15 0545  HGB 13.7 11.7*  HCT 38.9 34.3*    Assessment/Plan: Plan for discharge tomorrow   LOS: 1 day   Ardith Test W 03/02/2015, 8:37 AM

## 2015-03-02 NOTE — Lactation Note (Signed)
This note was copied from the chart of Alexandra Farhana Fellows. Lactation Consultation Note  Baby is feeding well.  Gave mom minimal assistance with positioning and she reported increased comfort.  Mom has had breast augmentation surgery to increase their size.  She denies any asymmetry.  There is about 3-4 finger breadths between her breasts but stria are noted on the breasts and she reports increase in size.  Implants were placed behind the muscle.  Colostrum is expressible.  Encouraged feeding on cue and hand expressing 4-5 times in 24 hours to help bring milk to volume.  Follow-up planned.  Patient Name: Alexandra [name] Today's Date: 03/02/2015 Reason for consult: Initial assessment   Maternal Data Has patient been taught Hand Expression?: Yes  Feeding Feeding Type: Breast Fed Length of feed: 13 min  LATCH Score/Interventions Latch: Grasps breast easily, tongue down, lips flanged, rhythmical sucking.  Audible Swallowing: A few with stimulation  Type of Nipple: Everted at rest and after stimulation  Comfort (Breast/Nipple): Soft / non-tender     Hold (Positioning): No assistance needed to correctly position infant at breast.  LATCH Score: 9  Lactation Tools Discussed/Used     Consult Status Consult Status: Follow-up Date: 03/03/15 Follow-up type: In-patient    Soyla Dryer 03/02/2015, 4:34 PM

## 2015-03-02 NOTE — Anesthesia Postprocedure Evaluation (Signed)
  Anesthesia Post-op Note  Patient: Alexandra [name]  Procedure(s) Performed: * No procedures listed *  Patient Location: Mother/Baby  Anesthesia Type:Epidural  Level of Consciousness: awake  Airway and Oxygen Therapy: Patient Spontanous Breathing  Post-op Pain: mild  Post-op Assessment: Patient's Cardiovascular Status Stable and Respiratory Function Stable              Post-op Vital Signs: stable  Last Vitals:  Filed Vitals:   03/02/15 0357  BP: 113/64  Pulse: 83  Temp: 36.7 C  Resp: 18    Complications: No apparent anesthesia complications

## 2015-03-02 NOTE — Clinical Social Work Maternal (Signed)
  CLINICAL SOCIAL WORK MATERNAL/CHILD NOTE  Patient Details  Name: Alexandra [name] MRN: [id number] Date of Birth: 06/08/1986  Date:  03/02/2015  Clinical Social Worker Initiating Note:  Izsak Meir, LCSW Date/ Time Initiated:  03/02/15/1315     Child's Name:  Alexandra [name]   Legal Guardian:  Alexandra [name] and Alexandra [name] (parents)  Need for Interpreter:  None   Date of Referral:  03/01/15     Reason for Referral:  History of anxiety  Referral Source:  Central Nursery   Address:  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  MOB and FOB presented as easily engaged and receptive to the visit. MOB displayed a full range in affect and was in a pleasant mood. MOB did not present with any symptoms of anxiety, nor did her thought process exhibit any anxious thoughts.MOB denied questions, concerns, or needs as she transitions postpartum. MOB reported that she has felt supported since being at the hospital, and is feeling pleased with her infant's birth story. She denied concerns related to breastfeeding, and shared belief that it is "going well". MOB also endorsed supportive family, friends, and employer.   MOB reported history of anxiety was secondary to stressful work environment. 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 During the pregnancy, the MOB endorsed normative worries and fears, but denied belief that they were long lasting or debilitating.  MOB presented as attentive and engaged as CSW provided education on perinatal mood and anxiety disorders. MOB agreed to contact her medical provider if needs arise postpartum. MOB and FOB expressed appreciation for the visit, and acknowledged ongoing CSW availability during the admission.   CSW Plan/Description:   1)Patient/Family Education: Perinatal mood and anxiety disorders 2)No Further Intervention Required/No Barriers to Discharge    Alexandra [name] N, LCSW 03/02/2015, 1:58 PM  

## 2015-03-03 NOTE — Discharge Instructions (Signed)
Nothing in vagina for 6 weeks.  No sex, tampons, and douching.  Other instructions as in [facility] Discharge Booklet. °

## 2015-03-03 NOTE — Lactation Note (Signed)
This note was copied from the chart of Alexandra [name]. Lactation Consultation Note  Patient Name: Alexandra Treasa Bradshaw [id number] Date: 03/03/2015 Reason for consult: Follow-up assessment   Follow-up at 69 hours old.  Infant has lost 6% weight at 24 hours with last night's weight check but lots voids-6 life & stools-5 life.  GA 40.5; BW 7 lbs, 7.9 oz.   Mom has history of breast implants under breasts tissue behind muscle.  Hx of anxiety, ADHD, and Fibromyalgia.  Mom has comfort gels she has been wearing for sore nipples. Upon assessment, no damage to nipples noted.  Breast tissue appears to be filling upon palpation and observation of visible veins.   Infant has breastfed x8 (10-60 min) (cluster fed last night and is still clustering this morning) + attempts x3 (0 min) in past 24 hours; voids-5 in 24 hours/ 6 life; stools-4 in 24 hours/ 5 life; LS-8 by RN. Infant was cuing when [facility] entered room.  LC offered to watch latch before they go home and mom agreed.  Mom tried latching frantic baby after a few attempts and hand expressed a drop of colostrum, infant latched with depth and flanged lips.  LC reviewed with mom importance of using asymmetrical latching technique and flanging bottom lip to achieve maximum depth for transferring milk.  [facility] taught dad how to assist with latching using teacup hold and manually flanging bottom lip.   LC noted thick sublingual frenulum under tongue, and cupping of tongue with crying, but upper lip flanges well without blanching.   Mom was concerned infant was not getting enough milk.  Reviewed normal infant feeding behaviors during cluster feeding.  Mom reports breasts feel a bit heavier today then yesterday. Encouraged mom to hand express extra between feedings for EBM supplementation as needed to satisfy infant.  Encouraged to continue exclusive breastfeeding.   LS-8 by [facility].  Parents were able to return demonstrate deep latch on right breast using cross-cradle hold.   Colostrum collection container given, curved-tip syringe, and spoons given.  Verbally taught parents how to spoon feed EBM back to infant and how to use curved-tip syringe at breast for feeding EBM back to infant at breast.   Mom has DEBP at home from insurance company.  Discussed using DEBP as extra stimulation and for EBM.  Encouraged using hands-on pumping with hand expression at end of pumping session to maximize milk output.  Discussed supply and demand.  Educated to use DEBP as needed to prevent engorgement. Follow-up with pediatrician on Sunday (per parents).  Encouraged parents that if weight loss is 9-10% on Sunday to call Lactation for outpatient appointment and further evaluation of feeding and tongue assessment.  LC did not discuss tongue assessment with parents at this visit, but instead encouraged follow-up with peds on Sunday and LC OP as needed based on weight. Encouraged to call for further questions if needed after discharge.          Maternal Data Has patient been taught Hand Expression?: Yes Does the patient have breastfeeding experience prior to this delivery?: No  Feeding Feeding Type: Breast Fed  LATCH Score/Interventions Latch: Grasps breast easily, tongue down, lips flanged, rhythmical sucking.  Audible Swallowing: A few with stimulation  Type of Nipple: Everted at rest and after stimulation  Comfort (Breast/Nipple): Filling, red/small blisters or bruises, mild/mod discomfort  Problem noted: Mild/Moderate discomfort Interventions (Mild/moderate discomfort): Comfort gels;Hand expression  Hold (Positioning): No assistance needed to correctly position infant at breast. Intervention(s): Breastfeeding basics reviewed;Support  Pillows;Position options;Skin to skin  LATCH Score: 8  Lactation Tools Discussed/Used     Consult Status Consult Status: Complete    Lendon Ka 03/03/2015, 11:02 AM

## 2015-03-03 NOTE — Progress Notes (Signed)
Post Partum Day 2 Subjective: up ad lib, voiding, tolerating PO and pain well controlled. Bonding well with baby. Fatigued due to broken sleep last night otherwise feels well.   Objective: Blood pressure 106/69, pulse 50, temperature 98 F (36.7 C), temperature source Oral, resp. rate 18, height  (1.778 m), weight 108.863 kg (240 lb), last menstrual period 12/09/2013, SpO2 98 %, unknown if currently breastfeeding.  Physical Exam:  General: alert, fatigued and no distress Lochia: appropriate Uterine Fundus: firm and 3cm below umbilicus DVT Evaluation: Negative Homan's sign. No significant calf/ankle edema.   Recent Labs  03/01/15 0638 03/02/15 0545  HGB 13.7 11.7*  HCT 38.9 34.3*    Assessment/Plan: Discharge home, Breastfeeding and Contraception undecided   LOS: 2 days   Alexandra [name] 03/03/2015, 8:38 AM

## 2015-03-03 NOTE — Discharge Summary (Signed)
Obstetric Discharge Summary Reason for Admission: induction of labor and postdates Prenatal Procedures: none Intrapartum Procedures: spontaneous vaginal delivery Postpartum Procedures: none Complications-Operative and Postpartum: none HEMOGLOBIN  Date Value Ref Range Status  03/02/2015 11.7* 12.0 - 15.0 g/dL Final   HCT  Date Value Ref Range Status  03/02/2015 34.3* 36.0 - 46.0 % Final    Physical Exam:  General: alert, fatigued and no distress Lochia: appropriate Uterine Fundus: firm DVT Evaluation: Negative Homan's sign. No significant calf/ankle edema.  Discharge Diagnoses: Post-date pregnancy and delivered  Discharge Information: Date: 03/03/2015 Activity: pelvic rest Diet: routine Medications: PNV and ibuprofen prn Condition: stable Instructions: refer to practice specific booklet Discharge to: home Follow-up Information    Follow up with Oliver Pila, MD. Call in 6 weeks.   Specialty:  Obstetrics and Gynecology   Why:  For postpartum visit   Contact information:   [address] [place] [address] [phone]       Newborn Data: Live born female  Birth Weight: 7 lb 7.9 oz (3399 g) APGAR: 9, 9  Home with mother.  Mare Loan Worema Osmond Steckman 03/03/2015, 8:43 AM

## 2015-04-12 ENCOUNTER — Ambulatory Visit (INDEPENDENT_AMBULATORY_CARE_PROVIDER_SITE_OTHER): Payer: PRIVATE HEALTH INSURANCE | Admitting: Internal Medicine

## 2015-04-12 ENCOUNTER — Encounter: Payer: Self-pay | Admitting: Internal Medicine

## 2015-04-12 VITALS — BP 114/76 | Temp 98.1°F | Wt 225.3 lb

## 2015-04-12 DIAGNOSIS — F909 Attention-deficit hyperactivity disorder, unspecified type: Secondary | ICD-10-CM | POA: Diagnosis not present

## 2015-04-12 DIAGNOSIS — B351 Tinea unguium: Secondary | ICD-10-CM

## 2015-04-12 DIAGNOSIS — Z79899 Other long term (current) drug therapy: Secondary | ICD-10-CM

## 2015-04-12 MED ORDER — AMPHETAMINE-DEXTROAMPHETAMINE 20 MG PO TABS: 20.0000 mg | ORAL_TABLET | Freq: Two times a day (BID) | ORAL | Status: DC

## 2015-04-12 NOTE — Progress Notes (Signed)
Pre visit review using our clinic review tool, if applicable. No additional management support is needed unless otherwise documented below in the visit note.  Chief Complaint  Patient presents with  . Follow-up    go back on  meds  . ADHD    HPI: Patient Alexandra [name]   29 y.o. with adhd  anxitey and back and neuritis sx comes in today for  Medication evaluation evaluation. Last visit for med  A year ago  . preganancy delivered    8 31 svd  40.5 weeks   Lactation": Not nursing. To go back   To work in november.    Went off all meds for preg  And doing ok to far  But would help to go back on the adhd   Med .  Sleep   4-6 hours  . Baby is healthy   Taking test for coding  In a week  Studying on line .   Neg tad  ROS: See pertinent positives and negatives per HPI. No cp sob syncope depression at this time toe nail seems to be growing out  See last note did have trauma and then discolored and spread .  No paibn  No other toes   Past Medical History  Diagnosis Date  . Allergic rhinitis, cause unspecified   . Anal fissure   . ADHD (attention deficit hyperactivity disorder)   . Low back pain   . Atypical chest pain   . Anemia   . Myalgia and myositis, unspecified   . Headache(784.0)   . Mononeuritis of lower limb, unspecified   . Screening examination for pulmonary tuberculosis   . Urinary tract infection, site not specified   . IBS (irritable bowel syndrome)   . History of Clostridium difficile infection     2007  . PVC (premature ventricular contraction)     had negative ECHO  . Hx of varicella   . Kidney stones   . Varicose veins     ablation  . H/O headache following lumbar puncture     done to R/O MS  . Interstitial cystitis     Family History  Problem Relation Age of Onset  . Hypertension Mother   . Hyperlipidemia Father   . Asthma Father     Social History   Social History  . Marital Status: Married    Spouse Name: N/A  . Number of Children: N/A    . Years of Education: N/A   Social History Main Topics  . Smoking status: Never Smoker   . Smokeless tobacco: None  . Alcohol Use: No  . Drug Use: No  . Sexual Activity: Yes   Other Topics Concern  . None   Social History Narrative    graduated [employer].   Now married spring summer 15    2 hh  [name]    Pet  2 dogs and cat exposure   ? 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    med reconciliation at [facility].     Insurance at [employer] healthy baby 8 16 studying for coding certification    Outpatient Prescriptions Prior to Visit  Medication Sig Dispense Refill  . cholecalciferol (VITAMIN D) 1000 UNITS tablet Take 2,000 Units by mouth daily.    . Prenatal Vit-Fe Fumarate-FA (PRENATAL PO) Take 1  tablet by mouth daily.    Marland Kitchen. amphetamine-dextroamphetamine (ADDERALL) 20 MG tablet Take 1 tablet (20 mg total) by mouth 2 (two) times daily. 180 tablet 0   No facility-administered medications prior to visit.     EXAM:  BP 114/76 mmHg  Temp(Src) 98.1 F (36.7 C) (Oral)  Wt 225 lb 4.8 oz (102.195 kg)  Body mass index is 32.33 kg/(m^2).  GENERAL: vitals reviewed and listed above, alert, oriented, appears well hydrated and in no acute distress HEENT: atraumatic, conjunctiva  clear, no obvious abnormalities on inspection of external nose and ears OP : no lesion edema or exudate  NECK: no obvious masses on inspection palpation  LUNGS: clear to auscultation bilaterally, no wheezes, rales or rhonchi, good air movement CV: HRRR, no clubbing cyanosis or  peripheral edema nl cap refill  MS: moves all extremities without noticeable focal  abnormality PSYCH: pleasant and cooperative, no obvious depression or anxiety Great toe nial with abot 40 % distal thickened and brown discoloration a bit flaky and   Nail bed clear  No redness   ASSESSMENT AND  PLAN:  Discussed the following assessment and plan:  Attention deficit hyperactivity disorder (ADHD), unspecified ADHD type  Medication management  Onychomycosis - after trauma appears to be grwoing out  contact us if wants to try lamisitl would need lfts baseline Ok to go back on med  Agree with limiting all meds  As she had been placed  On pre pregnancy   Only add as needed .   -Patient advised to return or notify health care team  if symptoms worsen ,persist or new concerns arise.  Patient Instructions  Ok to restart medication.    Let us know if the nail doesn't continue to grow out.   Consider lamisil at that time but would need baseline  Liver tests.   ROV in  6 months  Or as needed      Burna MortimerWanda K. Panosh M.D.

## 2015-04-12 NOTE — Patient Instructions (Signed)
Ok to restart medication.    Let us know if the nail doesn't continue to grow out.   Consider lamisil at that time but would need baseline  Liver tests.   ROV in  6 months  Or as needed

## 2015-04-13 ENCOUNTER — Encounter: Payer: Self-pay | Admitting: Internal Medicine

## 2015-05-17 ENCOUNTER — Encounter (HOSPITAL_COMMUNITY): Payer: Self-pay | Admitting: Emergency Medicine

## 2015-05-17 ENCOUNTER — Observation Stay (HOSPITAL_COMMUNITY)
Admission: EM | Admit: 2015-05-17 | Discharge: 2015-05-18 | Disposition: A | Discharge status: Home / Self Care | Payer: PRIVATE HEALTH INSURANCE | Attending: Urology | Admitting: Urology

## 2015-05-17 ENCOUNTER — Emergency Department (HOSPITAL_COMMUNITY): Payer: PRIVATE HEALTH INSURANCE

## 2015-05-17 DIAGNOSIS — R10A2 Flank pain, left side: Secondary | ICD-10-CM

## 2015-05-17 DIAGNOSIS — F909 Attention-deficit hyperactivity disorder, unspecified type: Secondary | ICD-10-CM | POA: Diagnosis not present

## 2015-05-17 DIAGNOSIS — Z87440 Personal history of urinary (tract) infections: Secondary | ICD-10-CM | POA: Insufficient documentation

## 2015-05-17 DIAGNOSIS — Z79899 Other long term (current) drug therapy: Secondary | ICD-10-CM | POA: Insufficient documentation

## 2015-05-17 DIAGNOSIS — R112 Nausea with vomiting, unspecified: Secondary | ICD-10-CM

## 2015-05-17 DIAGNOSIS — R109 Unspecified abdominal pain: Secondary | ICD-10-CM

## 2015-05-17 DIAGNOSIS — N200 Calculus of kidney: Secondary | ICD-10-CM

## 2015-05-17 DIAGNOSIS — D72829 Elevated white blood cell count, unspecified: Secondary | ICD-10-CM | POA: Diagnosis not present

## 2015-05-17 DIAGNOSIS — Q6211 Congenital occlusion of ureteropelvic junction: Secondary | ICD-10-CM

## 2015-05-17 DIAGNOSIS — R350 Frequency of micturition: Secondary | ICD-10-CM

## 2015-05-17 DIAGNOSIS — Z87442 Personal history of urinary calculi: Secondary | ICD-10-CM | POA: Diagnosis not present

## 2015-05-17 DIAGNOSIS — N132 Hydronephrosis with renal and ureteral calculous obstruction: Principal | ICD-10-CM | POA: Insufficient documentation

## 2015-05-17 DIAGNOSIS — N179 Acute kidney failure, unspecified: Secondary | ICD-10-CM | POA: Diagnosis not present

## 2015-05-17 DIAGNOSIS — N201 Calculus of ureter: Secondary | ICD-10-CM

## 2015-05-17 LAB — COMPREHENSIVE METABOLIC PANEL
ALT: 19 U/L (ref 14–54)
AST: 21 U/L (ref 15–41)
Albumin: 4.4 g/dL (ref 3.5–5.0)
Alkaline Phosphatase: 73 U/L (ref 38–126)
Anion gap: 9 (ref 5–15)
BUN: 12 mg/dL (ref 6–20)
CHLORIDE: 106 mmol/L (ref 101–111)
CO2: 25 mmol/L (ref 22–32)
CREATININE: 1.12 mg/dL — AB (ref 0.44–1.00)
Calcium: 9.8 mg/dL (ref 8.9–10.3)
GFR calc Af Amer: >60 mL/min (ref >60)
Glucose, Bld: 102 mg/dL — ABNORMAL HIGH (ref 65–99)
POTASSIUM: 4.1 mmol/L (ref 3.5–5.1)
SODIUM: 140 mmol/L (ref 135–145)
Total Bilirubin: 1.3 mg/dL — ABNORMAL HIGH (ref 0.3–1.2)
Total Protein: 7.9 g/dL (ref 6.5–8.1)

## 2015-05-17 LAB — CBC WITH DIFFERENTIAL/PLATELET
BASOS ABS: 0 10*3/uL (ref 0.0–0.1)
BASOS PCT: 0 %
EOS ABS: 0.1 10*3/uL (ref 0.0–0.7)
EOS PCT: 0 %
HCT: 43.7 % (ref 36.0–46.0)
Hemoglobin: 14.8 g/dL (ref 12.0–15.0)
Lymphocytes Relative: 10 %
Lymphs Abs: 1.5 10*3/uL (ref 0.7–4.0)
MCH: 30 pg (ref 26.0–34.0)
MCHC: 33.9 g/dL (ref 30.0–36.0)
MCV: 88.6 fL (ref 78.0–100.0)
MONO ABS: 1 10*3/uL (ref 0.1–1.0)
Monocytes Relative: 7 %
Neutro Abs: 11.5 10*3/uL — ABNORMAL HIGH (ref 1.7–7.7)
Neutrophils Relative %: 83 %
PLATELETS: 210 10*3/uL (ref 150–400)
RBC: 4.93 MIL/uL (ref 3.87–5.11)
RDW: 12.6 % (ref 11.5–15.5)
WBC: 14 10*3/uL — AB (ref 4.0–10.5)

## 2015-05-17 LAB — URINALYSIS, ROUTINE W REFLEX MICROSCOPIC
BILIRUBIN URINE: NEGATIVE
GLUCOSE, UA: NEGATIVE mg/dL
KETONES UR: 40 mg/dL — AB
Leukocytes, UA: NEGATIVE
Nitrite: NEGATIVE
PROTEIN: NEGATIVE mg/dL
Specific Gravity, Urine: 1.018 (ref 1.005–1.030)
pH: 7.5 (ref 5.0–8.0)

## 2015-05-17 LAB — LIPASE, BLOOD: LIPASE: 25 U/L (ref 11–51)

## 2015-05-17 MED ORDER — HYDROMORPHONE HCL 1 MG/ML IJ SOLN
1.0000 mg | Freq: Once | INTRAMUSCULAR | Status: AC
  Administered 2015-05-17: 1 mg via INTRAVENOUS
  Filled 2015-05-17: qty 1

## 2015-05-17 MED ORDER — SODIUM CHLORIDE 0.9 % IV BOLUS (SEPSIS)
1000.0000 mL | Freq: Once | INTRAVENOUS | Status: AC
  Administered 2015-05-17: 1000 mL via INTRAVENOUS

## 2015-05-17 MED ORDER — ONDANSETRON HCL 4 MG/2ML IJ SOLN
4.0000 mg | Freq: Once | INTRAMUSCULAR | Status: AC
  Administered 2015-05-17: 4 mg via INTRAVENOUS
  Filled 2015-05-17: qty 2

## 2015-05-17 MED ORDER — MORPHINE SULFATE (PF) 4 MG/ML IV SOLN
4.0000 mg | Freq: Once | INTRAVENOUS | Status: AC
  Administered 2015-05-17: 4 mg via INTRAVENOUS
  Filled 2015-05-17: qty 1

## 2015-05-17 NOTE — ED Provider Notes (Signed)
CSN: [id number]     Arrival date & time 05/17/15  1932 History   First MD Initiated Contact with Patient 05/17/15 2136     Chief Complaint  Patient presents with  . Flank Pain     (Consider location/radiation/quality/duration/timing/severity/associated sxs/prior Treatment) HPI Comments: Alexandra [name] is a 29 y.o. female with a PMHx of nephrolithiasis, headaches, anemia, low back pain, IBS, frequent UTIs, and interstitial cystitis as well as other medical conditions listed below, who presents to the ED with complaints of left flank pain that began at 2 PM. States this feels similar to prior kidney stones. She describes the pain is 10/10 constant sharp left flank pain intermittently radiating to the groin and left lower quadrant, worse with movement, and unrelieved with Vicodin. Associated symptoms include nausea, 3 episodes of nonbloody nonbilious emesis, and increased urinary frequency.  She denies any fevers, chills, chest pain, shortness breath, hematemesis, melena, hematochezia, diarrhea, constipation, obstipation, hematuria, dysuria, vaginal bleeding or discharge, numbness, tingling, weakness, recent travel, suspicious food intake, sick contacts, alcohol use, NSAID use, or recent antibiotics. LMP 05/12/15.  Patient is a 29 y.o. female presenting with flank pain. The history is provided by the patient. No language interpreter was used.  Flank Pain This is a recurrent problem. The current episode started today. The problem occurs constantly. The problem has been unchanged. Associated symptoms include abdominal pain (intermittent L lateral, radiating from flank), nausea, urinary symptoms and vomiting. Pertinent negatives include no arthralgias, chest pain, chills, fever, myalgias, numbness or weakness. The symptoms are aggravated by walking. She has tried oral narcotics for the symptoms. The treatment provided no relief.    Past Medical History  Diagnosis Date  . Allergic rhinitis, cause  unspecified   . Anal fissure   . ADHD (attention deficit hyperactivity disorder)   . Low back pain   . Atypical chest pain   . Anemia   . Myalgia and myositis, unspecified   . Headache(784.0)   . Mononeuritis of lower limb, unspecified   . Screening examination for pulmonary tuberculosis   . Urinary tract infection, site not specified   . IBS (irritable bowel syndrome)   . History of Clostridium difficile infection     2007  . PVC (premature ventricular contraction)     had negative ECHO  . Hx of varicella   . Kidney stones   . Varicose veins     ablation  . H/O headache following lumbar puncture     done to R/O MS  . Interstitial cystitis    Past Surgical History  Procedure Laterality Date  . Breast surgery      augmentation  . Endovenous ablation saphenous vein w/ laser     Family History  Problem Relation Age of Onset  . Hypertension Mother   . Hyperlipidemia Father   . Asthma Father    Social History  Substance Use Topics  . Smoking status: Never Smoker   . Smokeless tobacco: None  . Alcohol Use: No   OB History    Gravida Para Term Preterm AB TAB SAB Ectopic Multiple Living   1 1 1       0 1     Review of Systems  Constitutional: Negative for fever and chills.  Respiratory: Negative for shortness of breath.   Cardiovascular: Negative for chest pain.  Gastrointestinal: Positive for nausea, vomiting and abdominal pain (intermittent L lateral, radiating from flank). Negative for diarrhea, constipation and blood in stool.  Genitourinary: Positive for frequency and flank pain.  Negative for dysuria, hematuria, vaginal bleeding and vaginal discharge.  Musculoskeletal: Negative for myalgias and arthralgias.  Skin: Negative for color change.  Allergic/Immunologic: Negative for immunocompromised state.  Neurological: Negative for weakness and numbness.  Psychiatric/Behavioral: Negative for confusion.   10 Systems reviewed and are negative for acute change except  as noted in the HPI.    Allergies  Elmiron; Lexapro; and Septra  Home Medications   Prior to Admission medications   Medication Sig Start Date End Date Taking? Authorizing Provider  amphetamine-dextroamphetamine (ADDERALL) 20 MG tablet Take 1 tablet (20 mg total) by mouth 2 (two) times daily. 04/12/15 04/11/16  Madelin Headings, MD  cholecalciferol (VITAMIN D) 1000 UNITS tablet Take 2,000 Units by mouth daily.    Historical Provider, MD  Prenatal Vit-Fe Fumarate-FA (PRENATAL PO) Take 1 tablet by mouth daily.    Historical Provider, MD   BP 133/92 mmHg  Pulse 54  Temp(Src) 97.7 F (36.5 C) (Oral)  Resp 18  SpO2 100%  LMP 05/12/2015 Physical Exam  Constitutional: She is oriented to person, place, and time. Vital signs are normal. She appears well-developed and well-nourished.  Non-toxic appearance. No distress.  Afebrile, nontoxic, NAD  HENT:  Head: Normocephalic and atraumatic.  Mouth/Throat: Oropharynx is clear and moist and mucous membranes are normal.  Eyes: Conjunctivae and EOM are normal. Right eye exhibits no discharge. Left eye exhibits no discharge.  Neck: Normal range of motion. Neck supple.  Cardiovascular: Regular rhythm, normal heart sounds and intact distal pulses.  Bradycardia present.  Exam reveals no gallop and no friction rub.   No murmur heard. Slightly bradycardic which is baseline for pt  Pulmonary/Chest: Effort normal and breath sounds normal. No respiratory distress. She has no decreased breath sounds. She has no wheezes. She has no rhonchi. She has no rales.  Abdominal: Soft. Normal appearance and bowel sounds are normal. She exhibits no distension. There is tenderness in the left upper quadrant and left lower quadrant. There is CVA tenderness. There is no rigidity, no rebound, no guarding, no tenderness at McBurney's point and negative Murphy's sign.    Soft, nondistended, +BS throughout, with mild L lateral abdominal TTP, no r/g/r, neg murphy's, neg  mcburney's, +L sided CVA TTP   Musculoskeletal: Normal range of motion.  Neurological: She is alert and oriented to person, place, and time. She has normal strength. No sensory deficit.  Skin: Skin is warm, dry and intact. No rash noted.  Psychiatric: She has a normal mood and affect.  Nursing note and vitals reviewed.   ED Course  Procedures (including critical care time) Labs Review Labs Reviewed  URINALYSIS, ROUTINE W REFLEX MICROSCOPIC (NOT AT [facility]) - Abnormal; Notable for the following:    APPearance TURBID (*)    Hgb urine dipstick SMALL (*)    Ketones, ur 40 (*)    All other components within normal limits  CBC WITH DIFFERENTIAL/PLATELET - Abnormal; Notable for the following:    WBC 14.0 (*)    Neutro Abs 11.5 (*)    All other components within normal limits  COMPREHENSIVE METABOLIC PANEL - Abnormal; Notable for the following:    Glucose, Bld 102 (*)    Creatinine, Ser 1.12 (*)    Total Bilirubin 1.3 (*)    All other components within normal limits  URINE MICROSCOPIC-ADD ON - Abnormal; Notable for the following:    Squamous Epithelial / LPF 6-30 (*)    Bacteria, UA RARE (*)    All other components within normal limits  LIPASE, BLOOD  POC URINE PREG, ED    Imaging Review Ct Renal Stone Study  05/17/2015  CLINICAL DATA:  Acute onset of left-sided flank pain, nausea and vomiting. Initial encounter. EXAM: CT ABDOMEN AND PELVIS WITHOUT CONTRAST TECHNIQUE: Multidetector CT imaging of the abdomen and pelvis was performed following the standard protocol without IV contrast. COMPARISON:  CT of the abdomen and pelvis performed 03/13/2013, and pelvic ultrasound performed 12/09/2014 FINDINGS: Minimal bibasilar atelectasis is noted. Bilateral breast implants is noted. The liver and spleen are unremarkable in appearance. The gallbladder is within normal limits. The pancreas and adrenal glands are unremarkable. There is mild-to-moderate left-sided hydronephrosis, with an obstructing  large 8 x 7 mm stone proximally at the left ureteropelvic junction. Left-sided perinephric stranding and fluid are noted. Scattered nonobstructing left renal stones are seen, measuring up to 7 mm in size. Additional smaller nonobstructing right renal stones are seen, measuring up to 3 mm in size. No free fluid is identified. The small bowel is unremarkable in appearance. The stomach is within normal limits. No acute vascular abnormalities are seen. The appendix is normal in caliber, without evidence of appendicitis. The colon is unremarkable in appearance. The bladder is decompressed and not well assessed. The uterus is unremarkable. The ovaries are grossly symmetric. No suspicious adnexal masses are seen. No inguinal lymphadenopathy is seen. No acute osseous abnormalities are identified. IMPRESSION: 1. Mild-to-moderate left-sided hydronephrosis, with an obstructing large 8 x 7 mm stone proximally at the left ureteropelvic junction. Left-sided perinephric stranding and fluid noted. 2. Nonobstructing left renal stones measure up to 7 mm in size. Smaller nonobstructing right renal stones measure up to 3 mm in size. Electronically Signed   By: Roanna Raider M.D.   On: 05/17/2015 23:21   I have personally reviewed and evaluated these images and lab results as part of my medical decision-making.   EKG Interpretation None      MDM   Final diagnoses:  Left flank pain  Nephrolithiasis  Non-intractable vomiting with nausea, vomiting of unspecified type  Urinary frequency  Hydronephrosis with ureteropelvic junction (UPJ) obstruction  AKI (acute kidney injury) (HCC)    29 y.o. female here with left-sided flank pain and nausea and vomiting that began around 2 PM. States this feels similar to her prior kidney stones. On exam mild left lateral abdominal tenderness, mild left flank tenderness. Will obtain labs, urine pregnancy, urinalysis, and CT renal study. Will give fluids, nausea medicine, and pain  medicine. Will reassess shortly.  11:38 PM Pain initially not improved, but now improved with dilaudid. U/A with hgb, nitrite and leuk neg, few squamous, 0-5 WBC, and rare bacteria, not concerning for UTI. CT showing 8x84mm obstructing stone in L UPJ with perinephric stranding and hydronephrosis. Also has a few nonobstructing R renal stones as well as another 7mm nonobstructing L renal stone. CBC w/diff showing mild leukocytosis which could be demargination vs dehydration (likely, since pt's H/H in September 2016 was lower than today's which lends itself towards hemoconcentration). CMP showing Cr 1.12 up from baseline. Lipase WNL. Upreg not crossing over, but presumably negative given that CT was done and showed no fetus. Will consult urology. Pt last ate at lunchtime but threw this up.   12:10 AM Urology still hasn't responded, will have them repaged.   12:30 AM Repaging urology again. Dr. Retta Diones returning page, will admit for pain control and obs. Please see his notes for further documentation of care.  BP 132/85 mmHg  Pulse 52  Temp(Src) 97.7  F (36.5 C) (Oral)  Resp 18  SpO2 98%  LMP 05/12/2015  Meds ordered this encounter  Medications  . sodium chloride 0.9 % bolus 1,000 mL    Sig:   . ondansetron (ZOFRAN) injection 4 mg    Sig:   . morphine 4 MG/ML injection 4 mg    Sig:   . HYDROmorphone (DILAUDID) injection 1 mg    Sig:      Tayna Smethurst Camprubi-Soms, PA-C 05/18/15 0038  Courteney Randall An, MD 05/22/15 [phone]

## 2015-05-17 NOTE — ED Notes (Signed)
Pt states that she has had L sided flank pain w/ N/V since yesterday. Hx of kidney stones. Alert and oriented.

## 2015-05-18 ENCOUNTER — Encounter (HOSPITAL_COMMUNITY)
Admission: EM | Disposition: A | Discharge status: Home / Self Care | Payer: Self-pay | Source: Home / Self Care | Attending: Physician Assistant

## 2015-05-18 ENCOUNTER — Other Ambulatory Visit: Payer: Self-pay | Admitting: Urology

## 2015-05-18 ENCOUNTER — Encounter (HOSPITAL_COMMUNITY): Payer: Self-pay | Admitting: *Deleted

## 2015-05-18 ENCOUNTER — Observation Stay (HOSPITAL_COMMUNITY): Payer: PRIVATE HEALTH INSURANCE

## 2015-05-18 DIAGNOSIS — N201 Calculus of ureter: Secondary | ICD-10-CM | POA: Diagnosis present

## 2015-05-18 LAB — URINE MICROSCOPIC-ADD ON

## 2015-05-18 LAB — SURGICAL PCR SCREEN
MRSA, PCR: NEGATIVE
STAPHYLOCOCCUS AUREUS: NEGATIVE

## 2015-05-18 LAB — POCT PREGNANCY, URINE: PREG TEST UR: NEGATIVE

## 2015-05-18 LAB — PREGNANCY, URINE: PREG TEST UR: NEGATIVE

## 2015-05-18 SURGERY — LITHOTRIPSY, ESWL
Anesthesia: LOCAL | Laterality: Left

## 2015-05-18 MED ORDER — OXYCODONE-ACETAMINOPHEN 5-325 MG PO TABS: 1.0000 | ORAL_TABLET | ORAL | Status: DC | PRN

## 2015-05-18 MED ORDER — ACETAMINOPHEN 325 MG PO TABS
650.0000 mg | ORAL_TABLET | ORAL | Status: DC | PRN
  Administered 2015-05-18: 650 mg via ORAL
  Filled 2015-05-18: qty 2

## 2015-05-18 MED ORDER — LEVOFLOXACIN 500 MG PO TABS
500.0000 mg | ORAL_TABLET | Freq: Every day | ORAL | Status: DC
  Administered 2015-05-18: 500 mg via ORAL
  Filled 2015-05-18: qty 1

## 2015-05-18 MED ORDER — OXYCODONE HCL 5 MG PO TABS: 10.0000 mg | ORAL_TABLET | ORAL | Status: DC | PRN

## 2015-05-18 MED ORDER — ZOLPIDEM TARTRATE 5 MG PO TABS: 5.0000 mg | ORAL_TABLET | Freq: Every evening | ORAL | Status: DC | PRN

## 2015-05-18 MED ORDER — SODIUM CHLORIDE 0.45 % IV SOLN
INTRAVENOUS | Status: DC
  Administered 2015-05-18: 01:00:00 via INTRAVENOUS

## 2015-05-18 MED ORDER — ONDANSETRON HCL 4 MG/2ML IJ SOLN
4.0000 mg | INTRAMUSCULAR | Status: DC | PRN
  Administered 2015-05-18: 4 mg via INTRAVENOUS
  Filled 2015-05-18: qty 2

## 2015-05-18 MED ORDER — HYDROMORPHONE HCL 1 MG/ML IJ SOLN
1.0000 mg | INTRAMUSCULAR | Status: DC | PRN
  Administered 2015-05-18 (×2): 1 mg via INTRAVENOUS
  Administered 2015-05-18: 2 mg via INTRAVENOUS
  Administered 2015-05-18: 1 mg via INTRAVENOUS
  Filled 2015-05-18 (×3): qty 1
  Filled 2015-05-18: qty 2

## 2015-05-18 MED ORDER — TAMSULOSIN HCL 0.4 MG PO CAPS: 0.4000 mg | ORAL_CAPSULE | Freq: Every day | ORAL | Status: DC

## 2015-05-18 MED ORDER — HYDROMORPHONE HCL 1 MG/ML IJ SOLN
1.0000 mg | Freq: Once | INTRAMUSCULAR | Status: AC
  Administered 2015-05-18: 1 mg via INTRAVENOUS
  Filled 2015-05-18: qty 1

## 2015-05-18 NOTE — H&P (Signed)
H&P  Chief Complaint: Kidney stone  History of Present Illness: Alexandra [name] is a 29 y.o. year old female who is admitted this morning for stone management. She began having left flank pain about 4 days ago. At first, this was minor and she was made pain-free by hydrocodone at home. It became worse within the past 24 hours and is associated with nausea, vomiting and significant-10/10 pain. The patient presented to the emergency room where both hydromorphone and morphine were used to provide analgesic relief, although it took a significant dose of these. CT revealed a 7 x 9 mm left proximal ureteral stone with other renal calculi seen bilaterally. Because of the location of the patient stone, the size of the stone in the need for further pain management, she is admitted for further treatment. There is no history of recent urinary tract infection, fever, chills, her urinalysis revealed no evidence of infection.  Past Medical History  Diagnosis Date  . Allergic rhinitis, cause unspecified   . Anal fissure   . ADHD (attention deficit hyperactivity disorder)   . Low back pain   . Atypical chest pain   . Anemia   . Myalgia and myositis, unspecified   . Headache(784.0)   . Mononeuritis of lower limb, unspecified   . Screening examination for pulmonary tuberculosis   . Urinary tract infection, site not specified   . IBS (irritable bowel syndrome)   . History of Clostridium difficile infection     2007  . PVC (premature ventricular contraction)     had negative ECHO  . Hx of varicella   . Kidney stones   . Varicose veins     ablation  . H/O headache following lumbar puncture     done to R/O MS  . Interstitial cystitis     Past Surgical History  Procedure Laterality Date  . Breast surgery      augmentation  . Endovenous ablation saphenous vein w/ laser      Home Medications:  Medications Prior to Admission  Medication Sig Dispense Refill  . amphetamine-dextroamphetamine  (ADDERALL) 20 MG tablet Take 1 tablet (20 mg total) by mouth 2 (two) times daily. 180 tablet 0  . cholecalciferol (VITAMIN D) 1000 UNITS tablet Take 2,000 Units by mouth daily.    . Prenatal Vit-Fe Fumarate-FA (PRENATAL PO) Take 1 tablet by mouth daily.      Allergies:  Allergies  Allergen Reactions  . Elmiron [Pentosan Polysulfate Sodium] Swelling  . Lexapro [Escitalopram Oxalate] Other (See Comments)    Aggravated migraines  2015  . Septra [Bactrim] Rash    Family History  Problem Relation Age of Onset  . Hypertension Mother   . Hyperlipidemia Father   . Asthma Father     Social History:  reports that she has never smoked. She does not have any smokeless tobacco history on file. She reports that she does not drink alcohol or use illicit drugs.  ROS: A complete review of systems was performed.  All systems are negative except for pertinent findings as noted.  Physical Exam:  Vital signs in last 24 hours: Temp:  [97.7 F (36.5 C)-98.3 F (36.8 C)] 98.1 F (36.7 C) (11/17 0535) Pulse Rate:  [52-75] 75 (11/17 0535) Resp:  [18-20] 19 (11/17 0535) BP: (115-133)/(72-92) 124/75 mmHg (11/17 0535) SpO2:  [98 %-100 %] 98 % (11/17 0535) Weight:  [94.348 kg (208 lb)] 94.348 kg (208 lb) (11/17 0140) General:  Alert and oriented,she looks fairly comfortable  HEENT: Normocephalic, atraumatic  Neck: No JVD or lymphadenopathy Cardiovascular: Regular rate and rhythm Lungs: Clear bilaterally Abdomen: Soft, nontender, nondistended, no abdominal masses Back: No CVA tenderness Extremities: No edema Neurologic: Grossly intact  Laboratory Data:  Results for orders placed or performed during the hospital encounter of 05/17/15 (from the past 24 hour(s))  CBC with Differential     Status: Abnormal   Collection Time: 05/17/15  9:54 PM  Result Value Ref Range   WBC 14.0 (H) 4.0 - 10.5 K/uL   RBC 4.93 3.87 - 5.11 MIL/uL   Hemoglobin 14.8 12.0 - 15.0 g/dL   HCT 16.1 09.6 - 04.5 %   MCV 88.6  78.0 - 100.0 fL   MCH 30.0 26.0 - 34.0 pg   MCHC 33.9 30.0 - 36.0 g/dL   RDW 40.9 81.1 - 91.4 %   Platelets 210 150 - 400 K/uL   Neutrophils Relative % 83 %   Neutro Abs 11.5 (H) 1.7 - 7.7 K/uL   Lymphocytes Relative 10 %   Lymphs Abs 1.5 0.7 - 4.0 K/uL   Monocytes Relative 7 %   Monocytes Absolute 1.0 0.1 - 1.0 K/uL   Eosinophils Relative 0 %   Eosinophils Absolute 0.1 0.0 - 0.7 K/uL   Basophils Relative 0 %   Basophils Absolute 0.0 0.0 - 0.1 K/uL  Comprehensive metabolic panel     Status: Abnormal   Collection Time: 05/17/15  9:54 PM  Result Value Ref Range   Sodium 140 135 - 145 mmol/L   Potassium 4.1 3.5 - 5.1 mmol/L   Chloride 106 101 - 111 mmol/L   CO2 25 22 - 32 mmol/L   Glucose, Bld 102 (H) 65 - 99 mg/dL   BUN 12 6 - 20 mg/dL   Creatinine, Ser 7.82 (H) 0.44 - 1.00 mg/dL   Calcium 9.8 8.9 - 95.6 mg/dL   Total Protein 7.9 6.5 - 8.1 g/dL   Albumin 4.4 3.5 - 5.0 g/dL   AST 21 15 - 41 U/L   ALT 19 14 - 54 U/L   Alkaline Phosphatase 73 38 - 126 U/L   Total Bilirubin 1.3 (H) 0.3 - 1.2 mg/dL   GFR calc non Af Amer >60 >60 mL/min   GFR calc Af Amer >60 >60 mL/min   Anion gap 9 5 - 15  Lipase, blood     Status: None   Collection Time: 05/17/15  9:54 PM  Result Value Ref Range   Lipase 25 11 - 51 U/L  Urinalysis, Routine w reflex microscopic-may I&O cath if menses (not at [facility])     Status: Abnormal   Collection Time: 05/17/15 10:50 PM  Result Value Ref Range   Color, Urine YELLOW YELLOW   APPearance TURBID (A) CLEAR   Specific Gravity, Urine 1.018 1.005 - 1.030   pH 7.5 5.0 - 8.0   Glucose, UA NEGATIVE NEGATIVE mg/dL   Hgb urine dipstick SMALL (A) NEGATIVE   Bilirubin Urine NEGATIVE NEGATIVE   Ketones, ur 40 (A) NEGATIVE mg/dL   Protein, ur NEGATIVE NEGATIVE mg/dL   Nitrite NEGATIVE NEGATIVE   Leukocytes, UA NEGATIVE NEGATIVE  Urine microscopic-add on     Status: Abnormal   Collection Time: 05/17/15 10:50 PM  Result Value Ref Range   Squamous Epithelial / LPF 6-30  (A) NONE SEEN   WBC, UA 0-5 0 - 5 WBC/hpf   RBC / HPF 0-5 0 - 5 RBC/hpf   Bacteria, UA RARE (A) NONE SEEN   No results found for this or any  previous visit (from the past 240 hour(s)). Creatinine:  Recent Labs  05/17/15 2154  CREATININE 1.12*    Radiologic Imaging: Ct Renal Stone Study  05/17/2015  CLINICAL DATA:  Acute onset of left-sided flank pain, nausea and vomiting. Initial encounter. EXAM: CT ABDOMEN AND PELVIS WITHOUT CONTRAST TECHNIQUE: Multidetector CT imaging of the abdomen and pelvis was performed following the standard protocol without IV contrast. COMPARISON:  CT of the abdomen and pelvis performed 03/13/2013, and pelvic ultrasound performed 12/09/2014 FINDINGS: Minimal bibasilar atelectasis is noted. Bilateral breast implants is noted. The liver and spleen are unremarkable in appearance. The gallbladder is within normal limits. The pancreas and adrenal glands are unremarkable. There is mild-to-moderate left-sided hydronephrosis, with an obstructing large 8 x 7 mm stone proximally at the left ureteropelvic junction. Left-sided perinephric stranding and fluid are noted. Scattered nonobstructing left renal stones are seen, measuring up to 7 mm in size. Additional smaller nonobstructing right renal stones are seen, measuring up to 3 mm in size. No free fluid is identified. The small bowel is unremarkable in appearance. The stomach is within normal limits. No acute vascular abnormalities are seen. The appendix is normal in caliber, without evidence of appendicitis. The colon is unremarkable in appearance. The bladder is decompressed and not well assessed. The uterus is unremarkable. The ovaries are grossly symmetric. No suspicious adnexal masses are seen. No inguinal lymphadenopathy is seen. No acute osseous abnormalities are identified. IMPRESSION: 1. Mild-to-moderate left-sided hydronephrosis, with an obstructing large 8 x 7 mm stone proximally at the left ureteropelvic junction.  Left-sided perinephric stranding and fluid noted. 2. Nonobstructing left renal stones measure up to 7 mm in size. Smaller nonobstructing right renal stones measure up to 3 mm in size. Electronically Signed   By: Roanna RaiderJeffery  Chang M.D.   On: 05/17/2015 23:21   Laboratories were reviewed. CT scan images were reviewed. Proximal ureteral stone was noted on the left. Skin does stone distance approximately 12 cm, Hounsfield units 400. The stone is evident on the CT scout film  Impression/Assessment:  Left proximal ureteral stone, obstructing without other complicating factors  Plan:  I have discussed further management with the patient. She has been brought in for pain management. We talked about treatment-continued observation with medical expulsive therapy, shockwave lithotripsy as well as ureteroscopy, holmium laser and extraction of her stone. As the stone is proximal, easily imaged on scout film, and has a Hounsfield unit approximately 400, I think she is a good candidate for shockwave lithotripsy which would negate the need for general anesthetic and, more than likely with 1 treatment, effectively manage the stone. We talked about the slight decrease rate of the patient being stone free with lithotripsy versus ureteroscopy. She would like to proceed with the former. We will see about getting that scheduled today.  Chelsea AusDAHLSTEDT, Kandas Oliveto M 05/18/2015, 6:42 AM  Bertram MillardStephen M. Janeshia Ciliberto MD

## 2015-05-22 NOTE — Discharge Summary (Signed)
Physician Discharge Summary  Patient ID: Alexandra [name] MRN: [id number] DOB/AGE: September 05, 1985 29 y.o.  Admit date: 05/17/2015 Discharge date: 05/18/2015 Admission Diagnoses: Ureteral calculus  Discharge Diagnoses:  Active Problems:   Ureteric stone   Discharged Condition: good  Hospital Course: The patient was admitted after failed medical expulsive therapy. She then underwent ESWl on 05/18/2015. The patient tolerated the procedure well and was transferred to the floor. After the ESWL she felt well and wishes to go home.  Prior to discharge the pt was tolerating a regular diet, pain was controlled on PO pain meds, they were ambulating without difficulty, and they had normal bowel function.   Consults: None  Significant Diagnostic Studies: KUB  Treatments: surgery: ESWL  Discharge Exam: Blood pressure 117/77, pulse 51, temperature 97.6 F (36.4 C), temperature source Oral, resp. rate 18, height 5\' 10"  (1.778 m), weight 94.348 kg (208 lb), last menstrual period 05/12/2015, SpO2 98 %, unknown if currently breastfeeding. General appearance: alert, cooperative and appears stated age Head: Normocephalic, without obvious abnormality, atraumatic Eyes: conjunctivae/corneas clear. PERRL, EOM's intact. Fundi benign. Resp: clear to auscultation bilaterally GI: soft, non-tender; bowel sounds normal; no masses,  no organomegaly Neurologic: Alert and oriented X 3, normal strength and tone. Normal symmetric reflexes. Normal coordination and gait  Disposition: 01-Home or Self Care     Medication List    TAKE these medications        amphetamine-dextroamphetamine 20 MG tablet  Commonly known as:  ADDERALL  Take 1 tablet (20 mg total) by mouth 2 (two) times daily.     cholecalciferol 1000 UNITS tablet  Commonly known as:  VITAMIN D  Take 2,000 Units by mouth daily.     oxyCODONE-acetaminophen 5-325 MG tablet  Commonly known as:  ROXICET  Take 1 tablet by mouth every 4 (four) hours  as needed for severe pain.     PRENATAL PO  Take 1 tablet by mouth daily.     tamsulosin 0.4 MG Caps capsule  Commonly known as:  FLOMAX  Take 1 capsule (0.4 mg total) by mouth daily after supper.         SignedMalen Gauze: Taronda Comacho L 05/22/2015, 2:52 PM

## 2015-08-04 ENCOUNTER — Telehealth: Payer: Self-pay | Admitting: Internal Medicine

## 2015-08-04 MED ORDER — AMPHETAMINE-DEXTROAMPHETAMINE 20 MG PO TABS: 20.0000 mg | ORAL_TABLET | Freq: Two times a day (BID) | ORAL | Status: DC

## 2015-08-04 NOTE — Telephone Encounter (Signed)
Pt notified to pick up at the front desk. 

## 2015-08-04 NOTE — Telephone Encounter (Signed)
Pt request refill of the following: amphetamine-dextroamphetamine (ADDERALL) 20 MG tablet  Pt ask if rx can be for 90 days    Phamacy:

## 2015-08-15 ENCOUNTER — Telehealth: Payer: Self-pay | Admitting: Internal Medicine

## 2015-08-15 NOTE — Telephone Encounter (Signed)
Ok to use Friday morning.  Have the pt come fasting for lab work.

## 2015-08-15 NOTE — Telephone Encounter (Signed)
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 This is the first time they have required. Pt will need a 30 minute from the info she gave me.  Please advise if ok to work in. Pt states she needs as soon as she can get. Health form includes if vision and hearing have any limitations.

## 2015-08-16 ENCOUNTER — Telehealth: Payer: Self-pay | Admitting: Internal Medicine

## 2015-08-16 NOTE — Telephone Encounter (Signed)
Please work with the pt on schedule.  Thanks!

## 2015-08-16 NOTE — Telephone Encounter (Signed)
Patient just found out she has to work Friday morning.  She gets off at 9:30am.  She was wondering if she could come in later Friday morning for her physical.

## 2015-08-16 NOTE — Telephone Encounter (Signed)
Pt has been scheduled.  °

## 2015-08-17 NOTE — Telephone Encounter (Signed)
Moved patient's appt to 11:15am on 08/18/15.

## 2015-08-18 ENCOUNTER — Encounter: Payer: Self-pay | Admitting: Internal Medicine

## 2015-08-18 ENCOUNTER — Ambulatory Visit (INDEPENDENT_AMBULATORY_CARE_PROVIDER_SITE_OTHER): Payer: PRIVATE HEALTH INSURANCE | Admitting: Internal Medicine

## 2015-08-18 ENCOUNTER — Encounter: Payer: PRIVATE HEALTH INSURANCE | Admitting: Internal Medicine

## 2015-08-18 VITALS — BP 124/82 | Temp 97.5°F | Ht 67.5 in | Wt 189.7 lb

## 2015-08-18 DIAGNOSIS — Z79899 Other long term (current) drug therapy: Secondary | ICD-10-CM | POA: Diagnosis not present

## 2015-08-18 DIAGNOSIS — Z0000 Encounter for general adult medical examination without abnormal findings: Secondary | ICD-10-CM

## 2015-08-18 DIAGNOSIS — F909 Attention-deficit hyperactivity disorder, unspecified type: Secondary | ICD-10-CM

## 2015-08-18 DIAGNOSIS — Z111 Encounter for screening for respiratory tuberculosis: Secondary | ICD-10-CM | POA: Diagnosis not present

## 2015-08-18 LAB — BASIC METABOLIC PANEL
BUN: 13 mg/dL (ref 6–23)
CALCIUM: 9.4 mg/dL (ref 8.4–10.5)
CO2: 26 meq/L (ref 19–32)
Chloride: 105 mEq/L (ref 96–112)
Creatinine, Ser: 0.84 mg/dL (ref 0.40–1.20)
GFR: 84.6 mL/min (ref >60.00)
Glucose, Bld: 86 mg/dL (ref 70–99)
POTASSIUM: 3.8 meq/L (ref 3.5–5.1)
SODIUM: 139 meq/L (ref 135–145)

## 2015-08-18 LAB — CBC WITH DIFFERENTIAL/PLATELET
BASOS ABS: 0 10*3/uL (ref 0.0–0.1)
Basophils Relative: 0.3 % (ref 0.0–3.0)
EOS PCT: 1.3 % (ref 0.0–5.0)
Eosinophils Absolute: 0.1 10*3/uL (ref 0.0–0.7)
HEMATOCRIT: 43.9 % (ref 36.0–46.0)
Hemoglobin: 15 g/dL (ref 12.0–15.0)
LYMPHS PCT: 27.5 % (ref 12.0–46.0)
Lymphs Abs: 2.8 10*3/uL (ref 0.7–4.0)
MCHC: 34.1 g/dL (ref 30.0–36.0)
MCV: 86.2 fl (ref 78.0–100.0)
MONOS PCT: 7.4 % (ref 3.0–12.0)
Monocytes Absolute: 0.8 10*3/uL (ref 0.1–1.0)
NEUTROS ABS: 6.5 10*3/uL (ref 1.4–7.7)
Neutrophils Relative %: 63.5 % (ref 43.0–77.0)
PLATELETS: 255 10*3/uL (ref 150.0–400.0)
RBC: 5.09 Mil/uL (ref 3.87–5.11)
RDW: 14.1 % (ref 11.5–15.5)
WBC: 10.3 10*3/uL (ref 4.0–10.5)

## 2015-08-18 LAB — HEPATIC FUNCTION PANEL
ALBUMIN: 4.1 g/dL (ref 3.5–5.2)
ALK PHOS: 70 U/L (ref 39–117)
ALT: 14 U/L (ref 0–35)
AST: 14 U/L (ref 0–37)
Bilirubin, Direct: 0.1 mg/dL (ref 0.0–0.3)
TOTAL PROTEIN: 7.5 g/dL (ref 6.0–8.3)
Total Bilirubin: 0.8 mg/dL (ref 0.2–1.2)

## 2015-08-18 LAB — TSH: TSH: 0.84 u[IU]/mL (ref 0.35–4.50)

## 2015-08-18 LAB — LIPID PANEL
CHOLESTEROL: 292 mg/dL — AB (ref 0–200)
HDL: 77.5 mg/dL (ref >39.00)
LDL Cholesterol: 200 mg/dL — ABNORMAL HIGH (ref 0–99)
NONHDL: 214.47
Total CHOL/HDL Ratio: 4
Triglycerides: 70 mg/dL (ref 0.0–149.0)
VLDL: 14 mg/dL (ref 0.0–40.0)

## 2015-08-18 NOTE — Patient Instructions (Addendum)
Continue lifestyle intervention healthy eating and exercise . Will notify you  of labs when available.   Due for tox screen today or Monday  Will get you form when back    Health Maintenance, Female Adopting a healthy lifestyle and getting preventive care can go a long way to promote health and wellness. Talk with your health care provider about what schedule of regular examinations is right for you. This is a good chance for you to check in with your provider about disease prevention and staying healthy. In between checkups, there are plenty of things you can do on your own. Experts have done a lot of research about which lifestyle changes and preventive measures are most likely to keep you healthy. Ask your health care provider for more information. WEIGHT AND DIET  Eat a healthy diet  Be sure to include plenty of vegetables, fruits, low-fat dairy products, and lean protein.  Do not eat a lot of foods high in solid fats, added sugars, or salt.  Get regular exercise. This is one of the most important things you can do for your health.  Most adults should exercise for at least 150 minutes each week. The exercise should increase your heart rate and make you sweat (moderate-intensity exercise).  Most adults should also do strengthening exercises at least twice a week. This is in addition to the moderate-intensity exercise.  Maintain a healthy weight  Body mass index (BMI) is a measurement that can be used to identify possible weight problems. It estimates body fat based on height and weight. Your health care provider can help determine your BMI and help you achieve or maintain a healthy weight.  For females 2 years of age and older:   A BMI below 18.5 is considered underweight.  A BMI of 18.5 to 24.9 is normal.  A BMI of 25 to 29.9 is considered overweight.  A BMI of 30 and above is considered obese.  Watch levels of cholesterol and blood lipids  You should start having your  blood tested for lipids and cholesterol at 30 years of age, then have this test every 5 years.  You may need to have your cholesterol levels checked more often if:  Your lipid or cholesterol levels are high.  You are older than 30 years of age.  You are at high risk for heart disease.  CANCER SCREENING   Lung Cancer  Lung cancer screening is recommended for adults 41-49 years old who are at high risk for lung cancer because of a history of smoking.  A yearly low-dose CT scan of the lungs is recommended for people who:  Currently smoke.  Have quit within the past 15 years.  Have at least a 30-pack-year history of smoking. A pack year is smoking an average of one pack of cigarettes a day for 1 year.  Yearly screening should continue until it has been 15 years since you quit.  Yearly screening should stop if you develop a health problem that would prevent you from having lung cancer treatment.  Breast Cancer  Practice breast self-awareness. This means understanding how your breasts normally appear and feel.  It also means doing regular breast self-exams. Let your health care provider know about any changes, no matter how small.  If you are in your 20s or 30s, you should have a clinical breast exam (CBE) by a health care provider every 1-3 years as part of a regular health exam.  If you are 40 or older, have  a CBE every year. Also consider having a breast X-ray (mammogram) every year.  If you have a family history of breast cancer, talk to your health care provider about genetic screening.  If you are at high risk for breast cancer, talk to your health care provider about having an MRI and a mammogram every year.  Breast cancer gene (BRCA) assessment is recommended for women who have family members with BRCA-related cancers. BRCA-related cancers include:  Breast.  Ovarian.  Tubal.  Peritoneal cancers.  Results of the assessment will determine the need for genetic  counseling and BRCA1 and BRCA2 testing. Cervical Cancer Your health care provider may recommend that you be screened regularly for cancer of the pelvic organs (ovaries, uterus, and vagina). This screening involves a pelvic examination, including checking for microscopic changes to the surface of your cervix (Pap test). You may be encouraged to have this screening done every 3 years, beginning at age 21.  For women ages 30-65, health care providers may recommend pelvic exams and Pap testing every 3 years, or they may recommend the Pap and pelvic exam, combined with testing for human papilloma virus (HPV), every 5 years. Some types of HPV increase your risk of cervical cancer. Testing for HPV may also be done on women of any age with unclear Pap test results.  Other health care providers may not recommend any screening for nonpregnant women who are considered low risk for pelvic cancer and who do not have symptoms. Ask your health care provider if a screening pelvic exam is right for you.  If you have had past treatment for cervical cancer or a condition that could lead to cancer, you need Pap tests and screening for cancer for at least 20 years after your treatment. If Pap tests have been discontinued, your risk factors (such as having a new sexual partner) need to be reassessed to determine if screening should resume. Some women have medical problems that increase the chance of getting cervical cancer. In these cases, your health care provider may recommend more frequent screening and Pap tests. Colorectal Cancer  This type of cancer can be detected and often prevented.  Routine colorectal cancer screening usually begins at 30 years of age and continues through 30 years of age.  Your health care provider may recommend screening at an earlier age if you have risk factors for colon cancer.  Your health care provider may also recommend using home test kits to check for hidden blood in the stool.  A  small camera at the end of a tube can be used to examine your colon directly (sigmoidoscopy or colonoscopy). This is done to check for the earliest forms of colorectal cancer.  Routine screening usually begins at age 50.  Direct examination of the colon should be repeated every 5-10 years through 30 years of age. However, you may need to be screened more often if early forms of precancerous polyps or small growths are found. Skin Cancer  Check your skin from head to toe regularly.  Tell your health care provider about any new moles or changes in moles, especially if there is a change in a mole's shape or color.  Also tell your health care provider if you have a mole that is larger than the size of a pencil eraser.  Always use sunscreen. Apply sunscreen liberally and repeatedly throughout the day.  Protect yourself by wearing long sleeves, pants, a wide-brimmed hat, and sunglasses whenever you are outside. HEART DISEASE, DIABETES, AND HIGH   BLOOD PRESSURE   High blood pressure causes heart disease and increases the risk of stroke. High blood pressure is more likely to develop in:  People who have blood pressure in the high end of the normal range (130-139/85-89 mm Hg).  People who are overweight or obese.  People who are African American.  If you are 18-13 years of age, have your blood pressure checked every 3-5 years. If you are 57 years of age or older, have your blood pressure checked every year. You should have your blood pressure measured twice--once when you are at a hospital or clinic, and once when you are not at a hospital or clinic. Record the average of the two measurements. To check your blood pressure when you are not at a hospital or clinic, you can use:  An automated blood pressure machine at a pharmacy.  A home blood pressure monitor.  If you are between 12 years and 71 years old, ask your health care provider if you should take aspirin to prevent strokes.  Have  regular diabetes screenings. This involves taking a blood sample to check your fasting blood sugar level.  If you are at a normal weight and have a low risk for diabetes, have this test once every three years after 30 years of age.  If you are overweight and have a high risk for diabetes, consider being tested at a younger age or more often. PREVENTING INFECTION  Hepatitis B  If you have a higher risk for hepatitis B, you should be screened for this virus. You are considered at high risk for hepatitis B if:  You were born in a country where hepatitis B is common. Ask your health care provider which countries are considered high risk.  Your parents were born in a high-risk country, and you have not been immunized against hepatitis B (hepatitis B vaccine).  You have HIV or AIDS.  You use needles to inject street drugs.  You live with someone who has hepatitis B.  You have had sex with someone who has hepatitis B.  You get hemodialysis treatment.  You take certain medicines for conditions, including cancer, organ transplantation, and autoimmune conditions. Hepatitis C  Blood testing is recommended for:  Everyone born from [birth year] through 1965.  Anyone with known risk factors for hepatitis C. Sexually transmitted infections (STIs)  You should be screened for sexually transmitted infections (STIs) including gonorrhea and chlamydia if:  You are sexually active and are younger than 30 years of age.  You are older than 30 years of age and your health care provider tells you that you are at risk for this type of infection.  Your sexual activity has changed since you were last screened and you are at an increased risk for chlamydia or gonorrhea. Ask your health care provider if you are at risk.  If you do not have HIV, but are at risk, it may be recommended that you take a prescription medicine daily to prevent HIV infection. This is called pre-exposure prophylaxis (PrEP). You are  considered at risk if:  You are sexually active and do not regularly use condoms or know the HIV status of your partner(s).  You take drugs by injection.  You are sexually active with a partner who has HIV. Talk with your health care provider about whether you are at high risk of being infected with HIV. If you choose to begin PrEP, you should first be tested for HIV. You should then be tested every  3 months for as long as you are taking PrEP.  PREGNANCY   If you are premenopausal and you may become pregnant, ask your health care provider about preconception counseling.  If you may become pregnant, take 400 to 800 micrograms (mcg) of folic acid every day.  If you want to prevent pregnancy, talk to your health care provider about birth control (contraception). OSTEOPOROSIS AND MENOPAUSE   Osteoporosis is a disease in which the bones lose minerals and strength with aging. This can result in serious bone fractures. Your risk for osteoporosis can be identified using a bone density scan.  If you are 65 years of age or older, or if you are at risk for osteoporosis and fractures, ask your health care provider if you should be screened.  Ask your health care provider whether you should take a calcium or vitamin D supplement to lower your risk for osteoporosis.  Menopause may have certain physical symptoms and risks.  Hormone replacement therapy may reduce some of these symptoms and risks. Talk to your health care provider about whether hormone replacement therapy is right for you.  HOME CARE INSTRUCTIONS   Schedule regular health, dental, and eye exams.  Stay current with your immunizations.   Do not use any tobacco products including cigarettes, chewing tobacco, or electronic cigarettes.  If you are pregnant, do not drink alcohol.  If you are breastfeeding, limit how much and how often you drink alcohol.  Limit alcohol intake to no more than 1 drink per day for nonpregnant women. One  drink equals 12 ounces of beer, 5 ounces of wine, or 1 ounces of hard liquor.  Do not use street drugs.  Do not share needles.  Ask your health care provider for help if you need support or information about quitting drugs.  Tell your health care provider if you often feel depressed.  Tell your health care provider if you have ever been abused or do not feel safe at home.   This information is not intended to replace advice given to you by your health care provider. Make sure you discuss any questions you have with your health care provider.   Document Released: 12/31/2010 Document Revised: 07/08/2014 Document Reviewed: 05/19/2013 [employer] Interactive Patient Education 2016 [employer]  

## 2015-08-18 NOTE — Progress Notes (Signed)
Pre visit review using our clinic review tool, if applicable. No additional management support is needed unless otherwise documented below in the visit note.  Chief Complaint  Patient presents with  . Annual Exam    form for coaching NCPS    HPI: Patient  Alexandra [name]  30 y.o. 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  Still working [employer] day has infant at home.  ADHD:adderall  once or bid   Last gyne check October .  3 dogs   Child child stays with family. 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  Health Maintenance  Topic Date Due  . INFLUENZA VACCINE  01/30/2016  . PAP SMEAR  03/31/2018  . TETANUS/TDAP  10/29/2024  . HIV Screening  Completed   Health Maintenance Review LIFESTYLE:  Exercise:   running  Tobacco/ETS: no Alcohol: no Sugar beverages: coffee sweet tea Sleep: 4-8 hours  Interrupted cause  Of infant 65 month old sleeping pattern. Drug use: no    ROS:  GEN/ HEENT: No fever, significant weight changes sweats headaches vision problems hearing changes, CV/ PULM; No chest pain shortness of breath cough, syncope,edema  change in exercise tolerance. GI /GU: No adominal pain, vomiting, change in bowel habits. No blood in the stool. No significant GU symptoms. SKIN/HEME: ,no acute skin rashes suspicious lesions or bleeding. No lymphadenopathy, nodules, masses.  NEURO/ PSYCH:  No neurologic signs such as weakness numbness. No depression anxiety. IMM/ Allergy: No unusual infections.  Allergy .   REST of 12 system review negative except as per HPI   Past Medical History  Diagnosis Date  . Allergic rhinitis, cause unspecified   . Anal fissure   . ADHD (attention deficit hyperactivity disorder)   . Low back pain   . Atypical chest pain   . Anemia   . Myalgia and  myositis, unspecified   . Headache(784.0)   . Mononeuritis of lower limb, unspecified   . Screening examination for pulmonary tuberculosis   . Urinary tract infection, site not specified   . IBS (irritable bowel syndrome)   . History of Clostridium difficile infection     2007  . PVC (premature ventricular contraction)     had negative ECHO  . Hx of varicella   . Kidney stones   . Varicose veins     ablation  . H/O headache following lumbar puncture     done to R/O MS  . Interstitial cystitis     Past Surgical History  Procedure Laterality Date  . Breast surgery      augmentation  . Endovenous ablation saphenous vein w/ laser      Family History  Problem Relation Age of Onset  . Hypertension Mother   . Hyperlipidemia Father   . Asthma Father     Social History   Social History  . Marital Status: Married    Spouse Name: N/A  . Number of Children: N/A  . Years of Education: N/A   Social History Main Topics  . Smoking status: Never Smoker   . Smokeless tobacco: None  . Alcohol Use: No  . Drug Use: No  . Sexual Activity: Yes   Other Topics Concern  . None   Social History Narrative    graduated  [name] college.   Now married spring summer 15    2 hh  Alexandra [name]    Pet  2 dogs and cat exposure   ? 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 amphetamine-dextroamphetamine (ADDERALL) 20 MG tablet Take 1 tablet (20 mg total) by mouth 2 (two) times daily. 180 tablet 0  . cholecalciferol (VITAMIN D) 1000 UNITS tablet Take 2,000 Units by mouth daily.    . Prenatal Vit-Fe Fumarate-FA (PRENATAL PO) Take 1 tablet by mouth  daily.    Marland Kitchen oxyCODONE-acetaminophen (ROXICET) 5-325 MG tablet Take 1 tablet by mouth every 4 (four) hours as needed for severe pain. 30 tablet 0  . tamsulosin (FLOMAX) 0.4 MG CAPS capsule Take 1 capsule (0.4 mg total) by mouth daily after supper. 30 capsule 0   No facility-administered medications prior to visit.     EXAM:  BP 124/82 mmHg  Temp(Src) 97.5 F (36.4 C) (Oral)  Ht 5' 7.5" (1.715 m)  Wt 189 lb 11.2 oz (86.047 kg)  BMI 29.26 kg/m2  Body mass index is 29.26 kg/(m^2).  Physical Exam: Vital signs reviewed FOA:XBDH is a well-developed well-nourished alert cooperative    who appearsr stated age in no acute distress.  HEENT: normocephalic atraumatic , Eyes: PERRL EOM's full, conjunctiva clear, Nares: paten,t no deformity discharge or tenderness., Ears: no deformity EAC's clear TMs with normal landmarks. Mouth: clear OP, no lesions, edema.  Moist mucous membranes. Dentition in adequate repair. NECK: supple without masses, thyromegaly or bruits. CHEST/PULM:  Clear to auscultation and percussion breath sounds equal no wheeze , rales or rhonchi. No chest wall deformities or tenderness. CV: PMI is nondisplaced, S1 S2 no gallops, murmurs, rubs. Peripheral pulses are full without delay.No JVD .ABDOMEN: Bowel sounds normal nontender  No guard or rebound, no hepato splenomegal no CVA tenderness. . Extremtities:  No clubbing cyanosis or edema, no acute joint swelling or redness no focal atrophy NEURO:  Oriented x3, cranial nerves 3-12 appear to be intact, no obvious focal weakness,gait within normal limits no abnormal reflexes or asymmetrical  Back rom nl no pain today  SKIN: No acute rashes normal turgor, color, no bruising or petechiae. PSYCH: Oriented, good eye contact, no obvious depression anxiety, cognition and judgment appear normal. LN: no cervical axillary inguinal adenopathy   ASSESSMENT AND PLAN:  Discussed the following assessment and plan:  Visit for preventive health  examination - Plan: Basic metabolic panel, CBC with Differential/Platelet, Hepatic function panel, Lipid panel, TSH  Medication management - tox screen today   Screening-pulmonary TB - Plan: PPD  High risk medications (not anticoagulants) long-term use  Attention deficit hyperactivity disorder (ADHD), unspecified ADHD type   Healthy  meds reviewed  Alexandra [name] reports ocass to rare use recently of the hydrocodone   prescribed by neurologist Dr Macario Golds [facility] report reviewed with patient   480 hydrocodone 10 mg  Disp since november 2016 last one 2- 11- 17 . For 120.   Advise  avoid stocking up of cs medications.   Also  Remus Loffler was filled on 1 /23 that she  says is incorrect .different phamacy written by [place] md  In the fall.,  But says  Not taking  She will look into this .Avoid ambien    Only for rare use .sleep dependency  Problems develop.  Form for job completed will  pick up with ppd reading after weekend   Due for tox screen. Patient Care Team: Burnis Medin, MD as PCP - General Boyd Kerbs, MD as Referring Physician (Neurology) [facility]  (Urology) Paula Compton, MD as Attending Physician (Obstetrics and Gynecology) Patient Instructions  Continue lifestyle intervention healthy eating and exercise . Will notify you  of labs when available.   Due for tox screen today or Monday  Will get you form when back    Health Maintenance, Female Adopting a healthy lifestyle and getting preventive care can go a long way to promote health and wellness. Talk with your health care provider about what schedule of regular examinations is right for you. This is a good chance for you to check in with your provider about disease prevention and staying healthy. In between checkups, there are plenty of things you can do on your own. Experts have done a lot of research about which lifestyle changes and preventive measures are most likely to keep you healthy. Ask your health care provider for  more information. WEIGHT AND DIET  Eat a healthy diet  Be sure to include plenty of vegetables, fruits, low-fat dairy products, and lean protein.  Do not eat a lot of foods high in solid fats, added sugars, or salt.  Get regular exercise. This is one of the most important things you can do for your health.  Most adults should exercise for at least 150 minutes each week. The exercise should increase your heart rate and make you sweat (moderate-intensity exercise).  Most adults should also do strengthening exercises at least twice a week. This is in addition to the moderate-intensity exercise.  Maintain a healthy weight  Body mass index (BMI) is a measurement that can be used to identify possible weight problems. It estimates body fat based on height and weight. Your health care provider can help determine your BMI and help you achieve or maintain a healthy weight.  For females 20 years of age and older:   A BMI below 18.5 is considered underweight.  A BMI of 18.5 to 24.9 is normal.  A BMI of 25 to 29.9 is considered overweight.  A BMI of 30 and above is considered obese.  Watch levels of cholesterol and blood lipids  You should start having your blood tested for lipids and cholesterol at 30 years of age, then have this test every 5 years.  You may need to have your cholesterol levels checked more often if:  Your lipid or cholesterol levels are high.  You are older than 30 years of age.  You are at high risk for heart disease.  CANCER SCREENING   Lung Cancer  Lung cancer screening is recommended for adults 65-55 years old who are at high risk for lung cancer because of a history of smoking.  A yearly low-dose CT scan of the lungs is recommended for people who:  Currently smoke.  Have quit within the past 15 years.  Have at least a 30-pack-year history of smoking. A pack year is smoking an average of one pack of cigarettes a day for 1 year.  Yearly screening  should continue until it has been 15 years since you quit.  Yearly screening should  stop if you develop a health problem that would prevent you from having lung cancer treatment.  Breast Cancer  Practice breast self-awareness. This means understanding how your breasts normally appear and feel.  It also means doing regular breast self-exams. Let your health care provider know about any changes, no matter how small.  If you are in your 20s or 30s, you should have a clinical breast exam (CBE) by a health care provider every 1-3 years as part of a regular health exam.  If you are 109 or older, have a CBE every year. Also consider having a breast X-ray (mammogram) every year.  If you have a family history of breast cancer, talk to your health care provider about genetic screening.  If you are at high risk for breast cancer, talk to your health care provider about having an MRI and a mammogram every year.  Breast cancer gene (BRCA) assessment is recommended for women who have family members with BRCA-related cancers. BRCA-related cancers include:  Breast.  Ovarian.  Tubal.  Peritoneal cancers.  Results of the assessment will determine the need for genetic counseling and BRCA1 and BRCA2 testing. Cervical Cancer Your health care provider may recommend that you be screened regularly for cancer of the pelvic organs (ovaries, uterus, and vagina). This screening involves a pelvic examination, including checking for microscopic changes to the surface of your cervix (Pap test). You may be encouraged to have this screening done every 3 years, beginning at age 48.  For women ages 52-65, health care providers may recommend pelvic exams and Pap testing every 3 years, or they may recommend the Pap and pelvic exam, combined with testing for human papilloma virus (HPV), every 5 years. Some types of HPV increase your risk of cervical cancer. Testing for HPV may also be done on women of any age with unclear  Pap test results.  Other health care providers may not recommend any screening for nonpregnant women who are considered low risk for pelvic cancer and who do not have symptoms. Ask your health care provider if a screening pelvic exam is right for you.  If you have had past treatment for cervical cancer or a condition that could lead to cancer, you need Pap tests and screening for cancer for at least 20 years after your treatment. If Pap tests have been discontinued, your risk factors (such as having a new sexual partner) need to be reassessed to determine if screening should resume. Some women have medical problems that increase the chance of getting cervical cancer. In these cases, your health care provider may recommend more frequent screening and Pap tests. Colorectal Cancer  This type of cancer can be detected and often prevented.  Routine colorectal cancer screening usually begins at 30 years of age and continues through 30 years of age.  Your health care provider may recommend screening at an earlier age if you have risk factors for colon cancer.  Your health care provider may also recommend using home test kits to check for hidden blood in the stool.  A small camera at the end of a tube can be used to examine your colon directly (sigmoidoscopy or colonoscopy). This is done to check for the earliest forms of colorectal cancer.  Routine screening usually begins at age 13.  Direct examination of the colon should be repeated every 5-10 years through 30 years of age. However, you may need to be screened more often if early forms of precancerous polyps or small growths are found.  Skin Cancer  Check your skin from head to toe regularly.  Tell your health care provider about any new moles or changes in moles, especially if there is a change in a mole's shape or color.  Also tell your health care provider if you have a mole that is larger than the size of a pencil eraser.  Always use  sunscreen. Apply sunscreen liberally and repeatedly throughout the day.  Protect yourself by wearing long sleeves, pants, a wide-brimmed hat, and sunglasses whenever you are outside. HEART DISEASE, DIABETES, AND HIGH BLOOD PRESSURE   High blood pressure causes heart disease and increases the risk of stroke. High blood pressure is more likely to develop in:  People who have blood pressure in the high end of the normal range (130-139/85-89 mm Hg).  People who are overweight or obese.  People who are African American.  If you are 30-50 years of age, have your blood pressure checked every 3-5 years. If you are 16 years of age or older, have your blood pressure checked every year. You should have your blood pressure measured twice--once when you are at a hospital or clinic, and once when you are not at a hospital or clinic. Record the average of the two measurements. To check your blood pressure when you are not at a hospital or clinic, you can use:  An automated blood pressure machine at a pharmacy.  A home blood pressure monitor.  If you are between 51 years and 32 years old, ask your health care provider if you should take aspirin to prevent strokes.  Have regular diabetes screenings. This involves taking a blood sample to check your fasting blood sugar level.  If you are at a normal weight and have a low risk for diabetes, have this test once every three years after 29 years of age.  If you are overweight and have a high risk for diabetes, consider being tested at a younger age or more often. PREVENTING INFECTION  Hepatitis B  If you have a higher risk for hepatitis B, you should be screened for this virus. You are considered at high risk for hepatitis B if:  You were born in a country where hepatitis B is common. Ask your health care provider which countries are considered high risk.  Your parents were born in a high-risk country, and you have not been immunized against hepatitis B  (hepatitis B vaccine).  You have HIV or AIDS.  You use needles to inject street drugs.  You live with someone who has hepatitis B.  You have had sex with someone who has hepatitis B.  You get hemodialysis treatment.  You take certain medicines for conditions, including cancer, organ transplantation, and autoimmune conditions. Hepatitis C  Blood testing is recommended for:  Everyone born from [birth year] through 1965.  Anyone with known risk factors for hepatitis C. Sexually transmitted infections (STIs)  You should be screened for sexually transmitted infections (STIs) including gonorrhea and chlamydia if:  You are sexually active and are younger than 30 years of age.  You are older than 30 years of age and your health care provider tells you that you are at risk for this type of infection.  Your sexual activity has changed since you were last screened and you are at an increased risk for chlamydia or gonorrhea. Ask your health care provider if you are at risk.  If you do not have HIV, but are at risk, it may be recommended that you take  a prescription medicine daily to prevent HIV infection. This is called pre-exposure prophylaxis (PrEP). You are considered at risk if:  You are sexually active and do not regularly use condoms or know the HIV status of your partner(s).  You take drugs by injection.  You are sexually active with a partner who has HIV. Talk with your health care provider about whether you are at high risk of being infected with HIV. If you choose to begin PrEP, you should first be tested for HIV. You should then be tested every 3 months for as long as you are taking PrEP.  PREGNANCY   If you are premenopausal and you may become pregnant, ask your health care provider about preconception counseling.  If you may become pregnant, take 400 to 800 micrograms (mcg) of folic acid every day.  If you want to prevent pregnancy, talk to your health care provider about birth  control (contraception). OSTEOPOROSIS AND MENOPAUSE   Osteoporosis is a disease in which the bones lose minerals and strength with aging. This can result in serious bone fractures. Your risk for osteoporosis can be identified using a bone density scan.  If you are 5 years of age or older, or if you are at risk for osteoporosis and fractures, ask your health care provider if you should be screened.  Ask your health care provider whether you should take a calcium or vitamin D supplement to lower your risk for osteoporosis.  Menopause may have certain physical symptoms and risks.  Hormone replacement therapy may reduce some of these symptoms and risks. Talk to your health care provider about whether hormone replacement therapy is right for you.  HOME CARE INSTRUCTIONS   Schedule regular health, dental, and eye exams.  Stay current with your immunizations.   Do not use any tobacco products including cigarettes, chewing tobacco, or electronic cigarettes.  If you are pregnant, do not drink alcohol.  If you are breastfeeding, limit how much and how often you drink alcohol.  Limit alcohol intake to no more than 1 drink per day for nonpregnant women. One drink equals 12 ounces of beer, 5 ounces of wine, or 1 ounces of hard liquor.  Do not use street drugs.  Do not share needles.  Ask your health care provider for help if you need support or information about quitting drugs.  Tell your health care provider if you often feel depressed.  Tell your health care provider if you have ever been abused or do not feel safe at home.   This information is not intended to replace advice given to you by your health care provider. Make sure you discuss any questions you have with your health care provider.   Document Released: 12/31/2010 Document Revised: 07/08/2014 Document Reviewed: 05/19/2013 Elsevier Interactive Patient Education 2016 [place] K. Panosh M.D.

## 2015-08-19 ENCOUNTER — Encounter: Payer: Self-pay | Admitting: Internal Medicine

## 2015-08-19 DIAGNOSIS — E785 Hyperlipidemia, unspecified: Secondary | ICD-10-CM | POA: Insufficient documentation

## 2015-08-21 LAB — TB SKIN TEST: TB SKIN TEST: NEGATIVE

## 2015-09-05 ENCOUNTER — Encounter: Payer: Self-pay | Admitting: Internal Medicine

## 2015-09-12 ENCOUNTER — Telehealth: Payer: Self-pay | Admitting: Internal Medicine

## 2015-09-12 NOTE — Telephone Encounter (Signed)
Pt notified temiflu is not prescribed for prophylaxis.

## 2015-09-12 NOTE — Telephone Encounter (Signed)
We do not prescribe Tamiflu for cases like this

## 2015-09-12 NOTE — Telephone Encounter (Signed)
[facility] - Client TELEPHONE ADVICE RECORD [facility]  Patient Name: Alexandra [name]  DOB: 1985/07/09    Initial Comment caller states she was exposed to flu and has a 6mth old a home   Nurse Assessment  Nurse: Phylliss Bobowe, RN, Synetta FailAnita Date/Time (Eastern Time): 09/12/2015 3:14:40 PM  Confirm and document reason for call. If symptomatic, describe symptoms. You must click the next button to save text entered. ---caller states she was exposed to flu and has a 526 mth old a home caller stated that she has been exposed to the flu last week and this week and and has no fevers and has no runny nose and ahs slight cough and has no vomiting or diarrhea and has a headache that started this weekend and this weekend and ahs been able to eat and drink well and has no achiness  Has the patient traveled out of the country within the last 30 days? ---No  Does the patient have any new or worsening symptoms? ---Yes  Will a triage be completed? ---Yes  Related visit to physician within the last 2 weeks? ---No  Does the PT have any chronic conditions? (i.e. diabetes, asthma, etc.) ---No  Is the patient pregnant or possibly pregnant? (Ask all females between the ages of 1012-55) ---No  Is this a behavioral health or substance abuse call? ---No     Guidelines    Guideline Title Affirmed Question Affirmed Notes  Influenza Exposure [1] Influenza EXPOSURE within last 72 hours (3 days) AND [2] NOT HIGH RISK AND [3] strongly requests antiviral medication    Final Disposition User   Call PCP within 24 Hours Rowe, RN, Synetta Failnita    Comments  caller placed to the office back line at [phone] and spoke wit the staff and informed of the patient status and the request fro the Tamiflu   Referrals  REFERRED TO PCP OFFICE   Disagree/Comply: Comply

## 2015-09-21 ENCOUNTER — Other Ambulatory Visit: Payer: Self-pay | Admitting: Internal Medicine

## 2015-10-10 NOTE — Progress Notes (Signed)
Pre visit review using our clinic review tool, if applicable. No additional management support is needed unless otherwise documented below in the visit note.  Chief Complaint  Patient presents with  . Follow-up    lab lipids    HPI: Alexandra [name] 30 y.o.  Comes in for fu ;ab  And meds  Now on gabapentin again max 800 qid  Per neuro not taking that much     Lipids high on screen  Gained almost 100 # with preg and didn't take caution with food then  But now trying to exercise some and change diet  Since labs back.  Both paresn with high lipids but no vasc disease . Mom was able to control with LS   Not taking hydrocodone every day but gets refilled . Not taking ambien  reviewed   Cs log with patient .Marland Kitchen ROS: See pertinent positives and negatives per HPI.  Past Medical History  Diagnosis Date  . Allergic rhinitis, cause unspecified   . Anal fissure   . ADHD (attention deficit hyperactivity disorder)   . Low back pain   . Atypical chest pain   . Anemia   . Myalgia and myositis, unspecified   . Headache(784.0)   . Mononeuritis of lower limb, unspecified   . Screening examination for pulmonary tuberculosis   . Urinary tract infection, site not specified   . IBS (irritable bowel syndrome)   . History of Clostridium difficile infection     2007  . PVC (premature ventricular contraction)     had negative ECHO  . Hx of varicella   . Kidney stones     11 16   . Varicose veins     ablation  . H/O headache following lumbar puncture     done to R/O MS  . Interstitial cystitis     Family History  Problem Relation Age of Onset  . Hypertension Mother   . Hyperlipidemia Father   . Asthma Father   . Hyperlipidemia Mother     controlled with lsi    Social History   Social History  . Marital Status: Married    Spouse Name: N/A  . Number of Children: N/A  . Years of Education: N/A   Social History Main Topics  . Smoking status: Never Smoker   . Smokeless tobacco: None    . Alcohol Use: No  . Drug Use: No  . Sexual Activity: Yes   Other Topics Concern  . None   Social History Narrative    graduated [employer].   Now married spring summer 15  Husband a Curator    2 hh  [name]    Pet  2 dogs and cat exposure   ? 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 amphetamine-dextroamphetamine (ADDERALL) 20 MG tablet Take 1 tablet (20 mg total) by mouth 2 (two) times daily. 180 tablet 0  . cholecalciferol (VITAMIN D) 1000 UNITS tablet Take 2,000 Units by mouth daily.    Marland Kitchen HYDROcodone-acetaminophen (NORCO) 10-325 MG tablet Take 1 tablet by mouth every  6 (six) hours as needed.    . montelukast (SINGULAIR) 10 MG tablet TAKE 1 TABLET BY MOUTH AT BEDTIME. 90 tablet 1  . Prenatal Vit-Fe Fumarate-FA (PRENATAL PO) Take 1 tablet by mouth daily.     No facility-administered medications prior to visit.     EXAM:  BP 106/82 mmHg  Temp(Src) 97.9 F (36.6 C) (Oral)  Wt 192 lb 3.2 oz (87.181 kg)  Body mass index is 29.64 kg/(m^2).  GENERAL: vitals reviewed and listed above, alert, oriented, appears well hydrated and in no acute distress PSYCH: pleasant and cooperative, no obvious depression or anxiety Lab Results  Component Value Date   WBC 10.3 08/18/2015   HGB 15.0 08/18/2015   HCT 43.9 08/18/2015   PLT 255.0 08/18/2015   GLUCOSE 86 08/18/2015   CHOL 292* 08/18/2015   TRIG 70.0 08/18/2015   HDL 77.50 08/18/2015   LDLCALC 200* 08/18/2015   ALT 14 08/18/2015   AST 14 08/18/2015   NA 139 08/18/2015   K 3.8 08/18/2015   CL 105 08/18/2015   CREATININE 0.84 08/18/2015   BUN 13 08/18/2015   CO2 26  08/18/2015   TSH 0.84 08/18/2015   revewied with patient  [place] log ambien refilled jan last  Hydrocodone monthly 120 last one 3 17  Advised not to refill to stock up.  Last tox screen  See in record ASSESSMENT AND PLAN:  Discussed the following assessment and plan:  HLD (hyperlipidemia) - LDL over 200   fam hx but not of vascular dis strtegies lsi for now and recheck before next visit consier nutritional referral.  - Plan: Lipid panel  Medication management Total visit > 50% spent counseling and coordinating care as indicated in above note and in instructions to patient .  ldl considier med if continues hight  Dennie Bible will do lsi and get lipid panel here or  Ask for rx at work to be sent closer to home    -Patient advised to return or notify health care team  if symptoms worsen ,persist or new concerns arise.  Patient Instructions   Your cholesterol  Was very high   And needs to come down .  Try  lifestyle intervention healthy eating and exercise . Avoid processed foods  And  Some animal fats .  And .  lots of sugars .    High Cholesterol High cholesterol refers to having a high level of cholesterol in your blood. Cholesterol is a white, waxy, fat-like protein that your body needs in small amounts. Your liver makes all the cholesterol you need. Excess cholesterol comes from the food you eat. Cholesterol travels in your bloodstream through your blood vessels. If you have high cholesterol, deposits (plaque) may build up on the walls of your blood vessels. This makes the arteries narrower and stiffer. Plaque increases your risk of heart attack and stroke. Work with your health care provider to keep your cholesterol levels in a healthy range. RISK FACTORS Several things can make you more likely to have high cholesterol. These include:   Eating foods high in animal fat (saturated fat) or cholesterol.  Being overweight.  Not getting enough exercise.  Having a family history of  high cholesterol. SIGNS AND SYMPTOMS High cholesterol does not cause symptoms. DIAGNOSIS  Your health care provider can do a blood test to check whether you have high cholesterol. If you are older than 20, your health care provider may check your cholesterol every 4-6 years. You may be checked  more often if you already have high cholesterol or other risk factors for heart disease. The blood test for cholesterol measures the following:  Bad cholesterol (LDL cholesterol). This is the type of cholesterol that causes heart disease. This number should be less than 100.  Good cholesterol (HDL cholesterol). This type helps protect against heart disease. A healthy level of HDL cholesterol is 60 or higher.  Total cholesterol. This is the combined number of LDL cholesterol and HDL cholesterol. A healthy number is less than 200. TREATMENT  High cholesterol can be treated with diet changes, lifestyle changes, and medicine.   Diet changes may include eating more whole grains, fruits, vegetables, nuts, and fish. You may also have to cut back on red meat and foods with a lot of added sugar.  Lifestyle changes may include getting at least 40 minutes of aerobic exercise three times a week. Aerobic exercises include walking, biking, and swimming. Aerobic exercise along with a healthy diet can help you maintain a healthy weight. Lifestyle changes may also include quitting smoking.  If diet and lifestyle changes are not enough to lower your cholesterol, your health care provider may prescribe a statin medicine. This medicine has been shown to lower cholesterol and also lower the risk of heart disease. HOME CARE INSTRUCTIONS  Only take over-the-counter or prescription medicines as directed by your health care provider.   Follow a healthy diet as directed by your health care provider. For instance:   Eat chicken (without skin), fish, veal, shellfish, ground [place] breast, and round or loin cuts of red  meat.  Do not eat fried foods and fatty meats, such as hot dogs and salami.   Eat plenty of fruits, such as apples.   Eat plenty of vegetables, such as broccoli, potatoes, and carrots.   Eat beans, peas, and lentils.   Eat grains, such as barley, rice, couscous, and bulgur wheat.   Eat pasta without cream sauces.   Use skim or nonfat milk and low-fat or nonfat yogurt and cheeses. Do not eat or drink whole milk, cream, ice cream, egg yolks, and hard cheeses.   Do not eat stick margarine or tub margarines that contain trans fats (also called partially hydrogenated oils).   Do not eat cakes, cookies, crackers, or other baked goods that contain trans fats.   Do not eat saturated tropical oils, such as coconut and palm oil.   Exercise as directed by your health care provider. Increase your activity level with activities such as gardening or walking.   Keep all follow-up appointments.  SEEK MEDICAL CARE IF:  You are struggling to maintain a healthy diet or weight.  You need help starting an exercise program.  You need help to stop smoking. SEEK IMMEDIATE MEDICAL CARE IF:  You have chest pain.  You have trouble breathing.   This information is not intended to replace advice given to you by your health care provider. Make sure you discuss any questions you have with your health care provider.   Document Released: 06/17/2005 Document Revised: 07/08/2014 Document Reviewed: 04/09/2013 Elsevier Interactive Patient Education [employer]2016 Elsevier Inc.       Why follow it? Research shows. . Those who follow the Mediterranean diet have a reduced risk of heart disease  . The diet is associated with a reduced incidence of Parkinson's and Alzheimer's diseases . People following the diet may have longer life expectancies and lower rates of chronic diseases  . The Dietary Guidelines for Americans recommends the Mediterranean diet  as an eating plan to promote health and prevent  disease  What Is the Mediterranean Diet?  . Healthy eating plan based on typical foods and recipes of Mediterranean-style cooking . The diet is primarily a plant based diet; these foods should make up a majority of meals   Starches - Plant based foods should make up a majority of meals - They are an important sources of vitamins, minerals, energy, antioxidants, and fiber - Choose whole grains, foods high in fiber and minimally processed items  - Typical grain sources include wheat, oats, barley, corn, brown rice, bulgar, farro, millet, polenta, couscous  - Various types of beans include chickpeas, lentils, fava beans, black beans, white beans   Fruits  Veggies - Large quantities of antioxidant rich fruits & veggies; 6 or more servings  - Vegetables can be eaten raw or lightly drizzled with oil and cooked  - Vegetables common to the traditional Mediterranean Diet include: artichokes, arugula, beets, broccoli, brussel sprouts, cabbage, carrots, celery, collard greens, cucumbers, eggplant, kale, leeks, lemons, lettuce, mushrooms, okra, onions, peas, peppers, potatoes, pumpkin, radishes, rutabaga, shallots, spinach, sweet potatoes, turnips, zucchini - Fruits common to the Mediterranean Diet include: apples, apricots, avocados, cherries, clementines, dates, figs, grapefruits, grapes, melons, nectarines, oranges, peaches, pears, pomegranates, strawberries, tangerines  Fats - Replace butter and margarine with healthy oils, such as olive oil, canola oil, and tahini  - Limit nuts to no more than a handful a day  - Nuts include walnuts, almonds, pecans, pistachios, pine nuts  - Limit or avoid candied, honey roasted or heavily salted nuts - Olives are central to the [employer] - can be eaten whole or used in a variety of dishes   Meats Protein - Limiting red meat: no more than a few times a month - When eating red meat: choose lean cuts and keep the portion to the size of deck of cards - Eggs:  approx. 0 to 4 times a week  - Fish and lean poultry: at least 2 a week  - Healthy protein sources include, chicken, [place], lean beef, lamb - Increase intake of seafood such as tuna, salmon, trout, mackerel, shrimp, scallops - Avoid or limit high fat processed meats such as sausage and bacon  Dairy - Include moderate amounts of low fat dairy products  - Focus on healthy dairy such as fat free yogurt, skim milk, low or reduced fat cheese - Limit dairy products higher in fat such as whole or 2% milk, cheese, ice cream  Alcohol - Moderate amounts of red wine is ok  - No more than 5 oz daily for women (all ages) and men older than age 24  - No more than 10 oz of wine daily for men younger than 44  Other - Limit sweets and other desserts  - Use herbs and spices instead of salt to flavor foods  - Herbs and spices common to the traditional Mediterranean Diet include: basil, bay leaves, chives, cloves, cumin, fennel, garlic, lavender, marjoram, mint, oregano, parsley, pepper, rosemary, sage, savory, sumac, tarragon, thyme   It's not just a diet, it's a lifestyle:  . The Mediterranean diet includes lifestyle factors typical of those in the region  . Foods, drinks and meals are best eaten with others and savored . Daily physical activity is important for overall good health . This could be strenuous exercise like running and aerobics . This could also be more leisurely activities such as walking, housework, yard-work, or taking the stairs . Moderation  is the key; a balanced and healthy diet accommodates most foods and drinks . Consider portion sizes and frequency of consumption of certain foods   Meal Ideas & Options:  . Breakfast:  o Whole wheat toast or whole wheat English muffins with peanut butter & hard boiled egg o Steel cut oats topped with apples & cinnamon and skim milk  o Fresh fruit: banana, strawberries, melon, berries, peaches  o Smoothies: strawberries, bananas, greek yogurt, peanut  butter o Low fat greek yogurt with blueberries and granola  o Egg white omelet with spinach and mushrooms o Breakfast couscous: whole wheat couscous, apricots, skim milk, cranberries  . Sandwiches:  o Hummus and grilled vegetables (peppers, zucchini, squash) on whole wheat bread   o Grilled chicken on whole wheat pita with lettuce, tomatoes, cucumbers or tzatziki  o Tuna salad on whole wheat bread: tuna salad made with greek yogurt, olives, red peppers, capers, green onions o Garlic rosemary lamb pita: lamb sauted with garlic, rosemary, salt & pepper; add lettuce, cucumber, greek yogurt to pita - flavor with lemon juice and black pepper  . Seafood:  o Mediterranean grilled salmon, seasoned with garlic, basil, parsley, lemon juice and black pepper o Shrimp, lemon, and spinach whole-grain pasta salad made with low fat greek yogurt  o Seared scallops with lemon orzo  o Seared tuna steaks seasoned salt, pepper, coriander topped with tomato mixture of olives, tomatoes, olive oil, minced garlic, parsley, green onions and cappers  . Meats:  o Herbed greek chicken salad with kalamata olives, cucumber, feta  o Red bell peppers stuffed with spinach, bulgur, lean ground beef (or lentils) & topped with feta   o Kebabs: skewers of chicken, tomatoes, onions, zucchini, squash  o [place] burgers: made with red onions, mint, dill, lemon juice, feta cheese topped with roasted red peppers . Vegetarian o Cucumber salad: cucumbers, artichoke hearts, celery, red onion, feta cheese, tossed in olive oil & lemon juice  o Hummus and whole grain pita points with a greek salad (lettuce, tomato, feta, olives, cucumbers, red onion) o Lentil soup with celery, carrots made with vegetable broth, garlic, salt and pepper  o Tabouli salad: parsley, bulgur, mint, scallions, cucumbers, tomato, radishes, lemon juice, olive oil, salt and pepper.           Neta Mends. Panosh M.D.

## 2015-10-11 ENCOUNTER — Ambulatory Visit (INDEPENDENT_AMBULATORY_CARE_PROVIDER_SITE_OTHER): Payer: PRIVATE HEALTH INSURANCE | Admitting: Internal Medicine

## 2015-10-11 ENCOUNTER — Encounter: Payer: Self-pay | Admitting: Internal Medicine

## 2015-10-11 VITALS — BP 106/82 | Temp 97.9°F | Wt 192.2 lb

## 2015-10-11 DIAGNOSIS — E785 Hyperlipidemia, unspecified: Secondary | ICD-10-CM

## 2015-10-11 DIAGNOSIS — Z79899 Other long term (current) drug therapy: Secondary | ICD-10-CM

## 2015-10-11 NOTE — Patient Instructions (Signed)
Your cholesterol  Was very high   And needs to come down .  Try  lifestyle intervention healthy eating and exercise . Avoid processed foods  And  Some animal fats .  And .  lots of sugars .    High Cholesterol High cholesterol refers to having a high level of cholesterol in your blood. Cholesterol is a white, waxy, fat-like protein that your body needs in small amounts. Your liver makes all the cholesterol you need. Excess cholesterol comes from the food you eat. Cholesterol travels in your bloodstream through your blood vessels. If you have high cholesterol, deposits (plaque) may build up on the walls of your blood vessels. This makes the arteries narrower and stiffer. Plaque increases your risk of heart attack and stroke. Work with your health care provider to keep your cholesterol levels in a healthy range. RISK FACTORS Several things can make you more likely to have high cholesterol. These include:   Eating foods high in animal fat (saturated fat) or cholesterol.  Being overweight.  Not getting enough exercise.  Having a family history of high cholesterol. SIGNS AND SYMPTOMS High cholesterol does not cause symptoms. DIAGNOSIS  Your health care provider can do a blood test to check whether you have high cholesterol. If you are older than 20, your health care provider may check your cholesterol every 4-6 years. You may be checked more often if you already have high cholesterol or other risk factors for heart disease. The blood test for cholesterol measures the following:  Bad cholesterol (LDL cholesterol). This is the type of cholesterol that causes heart disease. This number should be less than 100.  Good cholesterol (HDL cholesterol). This type helps protect against heart disease. A healthy level of HDL cholesterol is 60 or higher.  Total cholesterol. This is the combined number of LDL cholesterol and HDL cholesterol. A healthy number is less than 200. TREATMENT  High cholesterol can  be treated with diet changes, lifestyle changes, and medicine.   Diet changes may include eating more whole grains, fruits, vegetables, nuts, and fish. You may also have to cut back on red meat and foods with a lot of added sugar.  Lifestyle changes may include getting at least 40 minutes of aerobic exercise three times a week. Aerobic exercises include walking, biking, and swimming. Aerobic exercise along with a healthy diet can help you maintain a healthy weight. Lifestyle changes may also include quitting smoking.  If diet and lifestyle changes are not enough to lower your cholesterol, your health care provider may prescribe a statin medicine. This medicine has been shown to lower cholesterol and also lower the risk of heart disease. HOME CARE INSTRUCTIONS  Only take over-the-counter or prescription medicines as directed by your health care provider.   Follow a healthy diet as directed by your health care provider. For instance:   Eat chicken (without skin), fish, veal, shellfish, ground [place] breast, and round or loin cuts of red meat.  Do not eat fried foods and fatty meats, such as hot dogs and salami.   Eat plenty of fruits, such as apples.   Eat plenty of vegetables, such as broccoli, potatoes, and carrots.   Eat beans, peas, and lentils.   Eat grains, such as barley, rice, couscous, and bulgur wheat.   Eat pasta without cream sauces.   Use skim or nonfat milk and low-fat or nonfat yogurt and cheeses. Do not eat or drink whole milk, cream, ice cream, egg yolks, and hard cheeses.  Do not eat stick margarine or tub margarines that contain trans fats (also called partially hydrogenated oils).   Do not eat cakes, cookies, crackers, or other baked goods that contain trans fats.   Do not eat saturated tropical oils, such as coconut and palm oil.   Exercise as directed by your health care provider. Increase your activity level with activities such as gardening or  walking.   Keep all follow-up appointments.  SEEK MEDICAL CARE IF:  You are struggling to maintain a healthy diet or weight.  You need help starting an exercise program.  You need help to stop smoking. SEEK IMMEDIATE MEDICAL CARE IF:  You have chest pain.  You have trouble breathing.   This information is not intended to replace advice given to you by your health care provider. Make sure you discuss any questions you have with your health care provider.   Document Released: 06/17/2005 Document Revised: 07/08/2014 Document Reviewed: 04/09/2013 Elsevier Interactive Patient Education [employer]       Why follow it? Research shows. . Those who follow the Mediterranean diet have a reduced risk of heart disease  . The diet is associated with a reduced incidence of Parkinson's and Alzheimer's diseases . People following the diet may have longer life expectancies and lower rates of chronic diseases  . The Dietary Guidelines for Americans recommends the Mediterranean diet as an eating plan to promote health and prevent disease  What Is the Mediterranean Diet?  . Healthy eating plan based on typical foods and recipes of Mediterranean-style cooking . The diet is primarily a plant based diet; these foods should make up a majority of meals   Starches - Plant based foods should make up a majority of meals - They are an important sources of vitamins, minerals, energy, antioxidants, and fiber - Choose whole grains, foods high in fiber and minimally processed items  - Typical grain sources include wheat, oats, barley, corn, brown rice, bulgar, farro, millet, polenta, couscous  - Various types of beans include chickpeas, lentils, fava beans, black beans, white beans   Fruits  Veggies - Large quantities of antioxidant rich fruits & veggies; 6 or more servings  - Vegetables can be eaten raw or lightly drizzled with oil and cooked  - Vegetables common to the traditional Mediterranean  Diet include: artichokes, arugula, beets, broccoli, brussel sprouts, cabbage, carrots, celery, collard greens, cucumbers, eggplant, kale, leeks, lemons, lettuce, mushrooms, okra, onions, peas, peppers, potatoes, pumpkin, radishes, rutabaga, shallots, spinach, sweet potatoes, turnips, zucchini - Fruits common to the Mediterranean Diet include: apples, apricots, avocados, cherries, clementines, dates, figs, grapefruits, grapes, melons, nectarines, oranges, peaches, pears, pomegranates, strawberries, tangerines  Fats - Replace butter and margarine with healthy oils, such as olive oil, canola oil, and tahini  - Limit nuts to no more than a handful a day  - Nuts include walnuts, almonds, pecans, pistachios, pine nuts  - Limit or avoid candied, honey roasted or heavily salted nuts - Olives are central to the [employer] - can be eaten whole or used in a variety of dishes   Meats Protein - Limiting red meat: no more than a few times a month - When eating red meat: choose lean cuts and keep the portion to the size of deck of cards - Eggs: approx. 0 to 4 times a week  - Fish and lean poultry: at least 2 a week  - Healthy protein sources include, chicken, [place], lean beef, lamb - Increase intake of seafood such  as tuna, salmon, trout, mackerel, shrimp, scallops - Avoid or limit high fat processed meats such as sausage and bacon  Dairy - Include moderate amounts of low fat dairy products  - Focus on healthy dairy such as fat free yogurt, skim milk, low or reduced fat cheese - Limit dairy products higher in fat such as whole or 2% milk, cheese, ice cream  Alcohol - Moderate amounts of red wine is ok  - No more than 5 oz daily for women (all ages) and men older than age 32  - N60o more than 10 oz of wine daily for men younger than 3265  Other - Limit sweets and other desserts  - Use herbs and spices instead of salt to flavor foods  - Herbs and spices common to the traditional Mediterranean Diet include:  basil, bay leaves, chives, cloves, cumin, fennel, garlic, lavender, marjoram, mint, oregano, parsley, pepper, rosemary, sage, savory, sumac, tarragon, thyme   It's not just a diet, it's a lifestyle:  . The Mediterranean diet includes lifestyle factors typical of those in the region  . Foods, drinks and meals are best eaten with others and savored . Daily physical activity is important for overall good health . This could be strenuous exercise like running and aerobics . This could also be more leisurely activities such as walking, housework, yard-work, or taking the stairs . Moderation is the key; a balanced and healthy diet accommodates most foods and drinks . Consider portion sizes and frequency of consumption of certain foods   Meal Ideas & Options:  . Breakfast:  o Whole wheat toast or whole wheat English muffins with peanut butter & hard boiled egg o Steel cut oats topped with apples & cinnamon and skim milk  o Fresh fruit: banana, strawberries, melon, berries, peaches  o Smoothies: strawberries, bananas, greek yogurt, peanut butter o Low fat greek yogurt with blueberries and granola  o Egg white omelet with spinach and mushrooms o Breakfast couscous: whole wheat couscous, apricots, skim milk, cranberries  . Sandwiches:  o Hummus and grilled vegetables (peppers, zucchini, squash) on whole wheat bread   o Grilled chicken on whole wheat pita with lettuce, tomatoes, cucumbers or tzatziki  o Tuna salad on whole wheat bread: tuna salad made with greek yogurt, olives, red peppers, capers, green onions o Garlic rosemary lamb pita: lamb sauted with garlic, rosemary, salt & pepper; add lettuce, cucumber, greek yogurt to pita - flavor with lemon juice and black pepper  . Seafood:  o Mediterranean grilled salmon, seasoned with garlic, basil, parsley, lemon juice and black pepper o Shrimp, lemon, and spinach whole-grain pasta salad made with low fat greek yogurt  o Seared scallops with lemon  orzo  o Seared tuna steaks seasoned salt, pepper, coriander topped with tomato mixture of olives, tomatoes, olive oil, minced garlic, parsley, green onions and cappers  . Meats:  o Herbed greek chicken salad with kalamata olives, cucumber, feta  o Red bell peppers stuffed with spinach, bulgur, lean ground beef (or lentils) & topped with feta   o Kebabs: skewers of chicken, tomatoes, onions, zucchini, squash  o [place]urkey burgers: made with red onions, mint, dill, lemon juice, feta cheese topped with roasted red peppers . Vegetarian o Cucumber salad: cucumbers, artichoke hearts, celery, red onion, feta cheese, tossed in olive oil & lemon juice  o Hummus and whole grain pita points with a greek salad (lettuce, tomato, feta, olives, cucumbers, red onion) o Lentil soup with celery, carrots made with vegetable broth,  garlic, salt and pepper  o Tabouli salad: parsley, bulgur, mint, scallions, cucumbers, tomato, radishes, lemon juice, olive oil, salt and pepper.

## 2015-12-21 ENCOUNTER — Telehealth: Payer: Self-pay | Admitting: Internal Medicine

## 2015-12-21 MED ORDER — AMPHETAMINE-DEXTROAMPHETAMINE 20 MG PO TABS: 20.0000 mg | ORAL_TABLET | Freq: Two times a day (BID) | ORAL | Status: DC

## 2015-12-21 NOTE — Telephone Encounter (Signed)
Pt needs new rx generic adderall 20 mg #180

## 2015-12-21 NOTE — Telephone Encounter (Signed)
Ok to refill 90 days  

## 2015-12-21 NOTE — Telephone Encounter (Signed)
Left a message for the pt to pick up at the front desk. 

## 2016-01-29 NOTE — Progress Notes (Deleted)
No chief complaint on file.   HPI: Alexandra [name] 30 y.o.  Fu adhd med and lipids  ROS: See pertinent positives and negatives per HPI.  Past Medical History:  Diagnosis Date  . ADHD (attention deficit hyperactivity disorder)   . Allergic rhinitis, cause unspecified   . Anal fissure   . Anemia   . Atypical chest pain   . H/O headache following lumbar puncture    done to R/O MS  . Headache(784.0)   . History of Clostridium difficile infection    2007  . Hx of varicella   . IBS (irritable bowel syndrome)   . Interstitial cystitis   . Kidney stones    11 16   . Low back pain   . Mononeuritis of lower limb, unspecified   . Myalgia and myositis, unspecified   . PVC (premature ventricular contraction)    had negative ECHO  . Screening examination for pulmonary tuberculosis   . Urinary tract infection, site not specified   . Varicose veins    ablation    Family History  Problem Relation Age of Onset  . Hypertension Mother   . Hyperlipidemia Father   . Asthma Father   . Hyperlipidemia Mother     controlled with lsi    Social History   Social History  . Marital status: Married    Spouse name: N/A  . Number of children: N/A  . Years of education: N/A   Social History Main Topics  . Smoking status: Never Smoker  . Smokeless tobacco: Not on file  . Alcohol use No  . Drug use: No  . Sexual activity: Yes   Other Topics Concern  . Not on file   Social History Narrative    graduated [employer].   Now married spring summer 15  Husband a Curator    2 hh  [name]    Pet  2 dogs and cat exposure   ? 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     Insurance at [employer] healthy baby 8 16 studying for coding certification fall 16   [employer]  [employer]     Outpatient Medications Prior to  Visit  Medication Sig Dispense Refill  . amphetamine-dextroamphetamine (ADDERALL) 20 MG tablet Take 1 tablet (20 mg total) by mouth 2 (two) times daily. 180 tablet 0  . cholecalciferol (VITAMIN D) 1000 UNITS tablet Take 2,000 Units by mouth daily.    Marland Kitchen gabapentin (NEURONTIN) 800 MG tablet Take 800 mg by mouth 5 (five) times daily.    Marland Kitchen HYDROcodone-acetaminophen (NORCO) 10-325 MG tablet Take 1 tablet by mouth every 6 (six) hours as needed.    . montelukast (SINGULAIR) 10 MG tablet TAKE 1 TABLET BY MOUTH AT BEDTIME. 90 tablet 1  . Prenatal Vit-Fe Fumarate-FA (PRENATAL PO) Take 1 tablet by mouth daily.     No facility-administered medications prior to visit.      EXAM:  There were no vitals taken for this visit.  There is no height or weight on file to calculate BMI.  GENERAL: vitals reviewed and listed above, alert, oriented, appears well hydrated and in no acute distress HEENT: atraumatic, conjunctiva  clear, no obvious abnormalities on inspection of external nose and ears OP : no lesion edema or exudate  NECK: no obvious masses on inspection palpation  LUNGS: clear to auscultation bilaterally, no wheezes, rales or rhonchi, good air movement CV: HRRR, no clubbing cyanosis or  peripheral edema nl cap refill  MS: moves all extremities without noticeable focal  abnormality PSYCH: pleasant and cooperative, no obvious depression or anxiety Lab Results  Component Value Date   WBC 10.3 08/18/2015   HGB 15.0 08/18/2015   HCT 43.9 08/18/2015   PLT 255.0 08/18/2015   GLUCOSE 86 08/18/2015   CHOL 292 (H) 08/18/2015   TRIG 70.0 08/18/2015   HDL 77.50 08/18/2015   LDLCALC 200 (H) 08/18/2015   ALT 14 08/18/2015   AST 14 08/18/2015   NA 139 08/18/2015   K 3.8 08/18/2015   CL 105 08/18/2015   CREATININE 0.84 08/18/2015   BUN 13 08/18/2015   CO2 26 08/18/2015   TSH 0.84 08/18/2015    ASSESSMENT AND PLAN:  Discussed the following assessment and plan:  Medication  management  Attention deficit hyperactivity disorder (ADHD), unspecified ADHD type  HLD (hyperlipidemia)  High risk medications (not anticoagulants) long-term use  -Patient advised to return or notify health care team  if symptoms worsen ,persist or new concerns arise.  There are no Patient Instructions on file for this visit.   Neta Mends. Panosh M.D.

## 2016-01-30 ENCOUNTER — Telehealth: Payer: Self-pay | Admitting: Family Medicine

## 2016-01-30 ENCOUNTER — Ambulatory Visit: Payer: PRIVATE HEALTH INSURANCE | Admitting: Internal Medicine

## 2016-01-30 DIAGNOSIS — Z0289 Encounter for other administrative examinations: Secondary | ICD-10-CM

## 2016-01-30 NOTE — Telephone Encounter (Signed)
Pt missed appointment today with Dr. Fabian Sharp.  Left a message for the pt to return call to the office.

## 2016-04-02 ENCOUNTER — Telehealth: Payer: Self-pay | Admitting: Internal Medicine

## 2016-04-02 ENCOUNTER — Other Ambulatory Visit (INDEPENDENT_AMBULATORY_CARE_PROVIDER_SITE_OTHER): Payer: Managed Care, Other (non HMO)

## 2016-04-02 DIAGNOSIS — E785 Hyperlipidemia, unspecified: Secondary | ICD-10-CM | POA: Diagnosis not present

## 2016-04-02 LAB — LIPID PANEL
CHOL/HDL RATIO: 4
Cholesterol: 303 mg/dL — ABNORMAL HIGH (ref 0–200)
HDL: 70.7 mg/dL (ref >39.00)
LDL Cholesterol: 210 mg/dL — ABNORMAL HIGH (ref 0–99)
NONHDL: 232.07
TRIGLYCERIDES: 111 mg/dL (ref 0.0–149.0)
VLDL: 22.2 mg/dL (ref 0.0–40.0)

## 2016-04-02 MED ORDER — AMPHETAMINE-DEXTROAMPHETAMINE 20 MG PO TABS: 20.0000 mg | ORAL_TABLET | Freq: Two times a day (BID) | ORAL | 0 refills | Status: DC

## 2016-04-02 NOTE — Telephone Encounter (Signed)
Patient notified to pick up 04/03/16.  Will be ready first thing in the morning.

## 2016-04-02 NOTE — Telephone Encounter (Signed)
Pt needs new rx generic adderall 20 mg #60 for months of oct,nov and dec

## 2016-04-02 NOTE — Telephone Encounter (Signed)
Ok to refill 90 days   Has appt  Next week  For med check

## 2016-04-09 ENCOUNTER — Encounter: Payer: Self-pay | Admitting: Internal Medicine

## 2016-04-09 ENCOUNTER — Ambulatory Visit (INDEPENDENT_AMBULATORY_CARE_PROVIDER_SITE_OTHER): Payer: Managed Care, Other (non HMO) | Admitting: Internal Medicine

## 2016-04-09 VITALS — BP 122/92 | Temp 98.2°F | Wt 186.0 lb

## 2016-04-09 DIAGNOSIS — Z79899 Other long term (current) drug therapy: Secondary | ICD-10-CM

## 2016-04-09 DIAGNOSIS — E785 Hyperlipidemia, unspecified: Secondary | ICD-10-CM

## 2016-04-09 DIAGNOSIS — F909 Attention-deficit hyperactivity disorder, unspecified type: Secondary | ICD-10-CM | POA: Diagnosis not present

## 2016-04-09 NOTE — Progress Notes (Signed)
Pre visit review using our clinic review tool, if applicable. No additional management support is needed unless otherwise documented below in the visit note.  Chief Complaint  Patient presents with  . Follow-up    HPI: Alexandra [name] 30 y.o. comes in follow-up of hyperlipidemia. She is worked on diet and exercise and has lost a number of pounds. She has view her lipid profile on line and is disappointed it is still quite high. She's tried to eat fairly clean get rid of processed foods sweets. Family history both parents had high cholesterol mom controlled with diet and get off medicine father is still on medicine. Grandparents generation may have had cardiovascular disease in their 33s and 39s.  She is still on her Adderall takes it most days occasionally takes to not always. Has been put on Soma for a neck problem by her neurologist and occasional Ambien lots of stress recently. Takes an occasional hydrocodone rescue headaches not every day. She is also been put on Maxalt and Zofran for headaches. She continues to work and has an infant toddler at home. ROS: See pertinent positives and negatives per HPI.  Past Medical History:  Diagnosis Date  . ADHD (attention deficit hyperactivity disorder)   . Allergic rhinitis, cause unspecified   . Anal fissure   . Anemia   . Atypical chest pain   . H/O headache following lumbar puncture    done to R/O MS  . Headache(784.0)   . History of Clostridium difficile infection    2007  . Hx of varicella   . IBS (irritable bowel syndrome)   . Interstitial cystitis   . Kidney stones    11 16   . Low back pain   . Mononeuritis of lower limb, unspecified   . Myalgia and myositis, unspecified   . PVC (premature ventricular contraction)    had negative ECHO  . Screening examination for pulmonary tuberculosis   . Urinary tract infection, site not specified   . Varicose veins    ablation    Family History  Problem Relation Age of Onset  .  Hypertension Mother   . Hyperlipidemia Father   . Asthma Father   . Hyperlipidemia Mother     controlled with lsi    Social History   Social History  . Marital status: Married    Spouse name: N/A  . Number of children: N/A  . Years of education: N/A   Social History Main Topics  . Smoking status: Never Smoker  . Smokeless tobacco: None  . Alcohol use No  . Drug use: No  . Sexual activity: Yes   Other Topics Concern  . None   Social History Narrative    graduated [employer].   Now married spring summer 15  Husband a Curator    2 hh  [name]    Pet  2 dogs and cat exposure   ? 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 amphetamine-dextroamphetamine (ADDERALL) 20 MG tablet Take 1 tablet (20 mg total) by mouth 2 (two) times daily.  60 tablet 0  . amphetamine-dextroamphetamine (ADDERALL) 20 MG tablet Take 1 tablet (20 mg total) by mouth 2 (two) times daily. 60 tablet 0  . amphetamine-dextroamphetamine (ADDERALL) 20 MG tablet Take 1 tablet (20 mg total) by mouth 2 (two) times daily. 60 tablet 0  . gabapentin (NEURONTIN) 800 MG tablet Take 800 mg by mouth 5 (five) times daily.    Marland Kitchen. HYDROcodone-acetaminophen (NORCO) 10-325 MG tablet Take 1 tablet by mouth every 6 (six) hours as needed.    . montelukast (SINGULAIR) 10 MG tablet TAKE 1 TABLET BY MOUTH AT BEDTIME. 90 tablet 1  . cholecalciferol (VITAMIN D) 1000 UNITS tablet Take 2,000 Units by mouth daily.    . Prenatal Vit-Fe Fumarate-FA (PRENATAL PO) Take 1 tablet by mouth daily.     No facility-administered medications prior to visit.      EXAM:  BP (!) 122/92 (BP  Location: Right Arm, Patient Position: Sitting, Cuff Size: Normal)   Temp 98.2 F (36.8 C) (Oral)   Wt 186 lb (84.4 kg)   BMI 28.70 kg/m   Body mass index is 28.7 kg/m.  GENERAL: vitals reviewed and listed above, alert, oriented, appears well hydrated and in no acute distress HEENT: atraumatic, conjunctiva  clear, no obvious abnormalities on inspection of external nose and ears MS: moves all extremities without noticeable focal  abnormality PSYCH: pleasant and cooperative, no obvious depression or anxiety Lab Results  Component Value Date   WBC 10.3 08/18/2015   HGB 15.0 08/18/2015   HCT 43.9 08/18/2015   PLT 255.0 08/18/2015   GLUCOSE 86 08/18/2015   CHOL 303 (H) 04/02/2016   TRIG 111.0 04/02/2016   HDL 70.70 04/02/2016   LDLCALC 210 (H) 04/02/2016   ALT 14 08/18/2015   AST 14 08/18/2015   NA 139 08/18/2015   K 3.8 08/18/2015   CL 105 08/18/2015   CREATININE 0.84 08/18/2015   BUN 13 08/18/2015   CO2 26 08/18/2015   TSH 0.84 08/18/2015   Wt Readings from Last 3 Encounters:  04/09/16 186 lb (84.4 kg)  10/11/15 192 lb 3.2 oz (87.2 kg)  08/18/15 189 lb 11.2 oz (86 kg)    ASSESSMENT AND PLAN:  Discussed the following assessment and plan:  Hyperlipidemia, unspecified hyperlipidemia type - Plan: Amb ref to Medical Nutrition Therapy-MNT  Medication management  Attention deficit hyperactivity disorder (ADHD), unspecified ADHD type tox screen today    See updated med list  Cautioned about number of controlled substances she does not take them all every day. Managed by her neurologist specialist. -Patient advised to return or notify health care team  if symptoms worsen ,persist or new concerns arise.  Patient Instructions  Will  Do referral to   To dietician and nutritional  .    Tract your intake before the visit to get the best advice.    Repeat  Lipid  And fu in 4 months     Clebert Wenger K. Marne Meline M.D.

## 2016-04-09 NOTE — Assessment & Plan Note (Signed)
Cholesterol still 300 and above an LDL above 200. Despite her efforts at weight loss she doesn't want to take a statin which I understand. No other risk factors. Will refer to nutrition dietary have her continue to lose weight in a healthy manner and see if her lipid profile can be within an acceptable range for now. Recheck lipids in 4 months and office visit. Will refer. If she finds a location dietary consult closer to her work she can let us know.

## 2016-04-09 NOTE — Patient Instructions (Addendum)
Will  Do referral to   To dietician and nutritional  .    Tract your intake before the visit to get the best advice.    Repeat  Lipid  And fu in 4 months

## 2016-04-26 ENCOUNTER — Encounter: Payer: Self-pay | Admitting: Internal Medicine

## 2016-05-07 ENCOUNTER — Other Ambulatory Visit: Payer: Self-pay | Admitting: Family Medicine

## 2016-05-17 ENCOUNTER — Telehealth: Payer: Self-pay | Admitting: Family Medicine

## 2016-05-17 NOTE — Telephone Encounter (Signed)
Pt has Assured Toxicology testing on 04/09/16.  Positive for the Adderal.  Also showed a barbiturate that is not on her medication list.  Dr. Fabian SharpPanosh would like to know what that medicine is.  Left a message for a return call.

## 2016-05-20 NOTE — Telephone Encounter (Signed)
soma is not  A  barbiturate  But is a controlled substance  Not sure if other meds can show up as positive screen.   Please  Ask amy to  Get a list of meds that would show up [positive for barbiturate screen .  Plan recheck screen   tox screen at her next 90 refill  And   Plan to check for her other med list analytes

## 2016-05-20 NOTE — Telephone Encounter (Signed)
Spoke to the pt.  We went over medication list and all are correct.  She assured me that she is not taking any other medication.  Would like to know if Alexandra [name] is classified as a barbiturate.  Please advise.  Thanks!  Per pt, ok to leave message on machine.

## 2016-05-21 NOTE — Telephone Encounter (Signed)
Spoke to the pt and informed her that will repeat screen at next refill.  Pt agreed.  Gave a copy of message to Amy to pull list of barbiturates.

## 2016-07-30 ENCOUNTER — Telehealth: Payer: Self-pay | Admitting: Internal Medicine

## 2016-07-30 NOTE — Telephone Encounter (Signed)
Patient requesting RX Refill on amphetamine-dextroamphetamine (ADDERALL) 20 MG tablet. Patient is requesting 3 month refill.  Contact Info: [phone]

## 2016-07-31 ENCOUNTER — Other Ambulatory Visit: Payer: Self-pay | Admitting: Internal Medicine

## 2016-08-01 ENCOUNTER — Encounter: Payer: Self-pay | Admitting: Internal Medicine

## 2016-08-01 NOTE — Telephone Encounter (Signed)
See last tox screen  Please  Repeat uds tox screen  Collect   Then can give refill  X 1 month   If ok then can refill for more  Print out  A  CS report.

## 2016-08-01 NOTE — Telephone Encounter (Signed)
Spoke to the pt.  She will do uds on 08/02/16 and will then collect prescription for a 1 month supply.  Will hold message for pt to come.

## 2016-08-02 MED ORDER — AMPHETAMINE-DEXTROAMPHETAMINE 20 MG PO TABS: 20.0000 mg | ORAL_TABLET | Freq: Two times a day (BID) | ORAL | 0 refills | Status: DC

## 2016-08-02 NOTE — Telephone Encounter (Signed)
Pt came by the office and gave urine.  Confirmed with Nana.  Rx printed for 1 month supply until results come in.  Pt notified.

## 2016-08-06 ENCOUNTER — Encounter: Payer: Self-pay | Admitting: Internal Medicine

## 2016-08-07 ENCOUNTER — Encounter: Payer: Self-pay | Admitting: Internal Medicine

## 2016-08-12 ENCOUNTER — Ambulatory Visit (INDEPENDENT_AMBULATORY_CARE_PROVIDER_SITE_OTHER): Payer: Managed Care, Other (non HMO) | Admitting: Internal Medicine

## 2016-08-12 ENCOUNTER — Encounter: Payer: Self-pay | Admitting: Internal Medicine

## 2016-08-12 VITALS — BP 112/82 | Temp 98.2°F | Wt 177.0 lb

## 2016-08-12 DIAGNOSIS — R892 Abnormal level of other drugs, medicaments and biological substances in specimens from other organs, systems and tissues: Secondary | ICD-10-CM | POA: Diagnosis not present

## 2016-08-12 DIAGNOSIS — F909 Attention-deficit hyperactivity disorder, unspecified type: Secondary | ICD-10-CM

## 2016-08-12 DIAGNOSIS — Z79899 Other long term (current) drug therapy: Secondary | ICD-10-CM

## 2016-08-12 DIAGNOSIS — E785 Hyperlipidemia, unspecified: Secondary | ICD-10-CM

## 2016-08-12 MED ORDER — AMPHETAMINE-DEXTROAMPHETAMINE 20 MG PO TABS: 20.0000 mg | ORAL_TABLET | Freq: Two times a day (BID) | ORAL | 0 refills | Status: DC

## 2016-08-12 NOTE — Patient Instructions (Addendum)
Fasting lipid panel in June and then OV and med check . Keep us informed . About meds supplements.  Continue

## 2016-08-12 NOTE — Progress Notes (Signed)
Chief Complaint  Patient presents with  . Follow-up    HPI: Alexandra [name] 31 y.o. comes in at our request because of 2 UDS that show barbiturates and she is not on these. She brings in her bottles no meds and the amitriptyline no longer taking gabapentin hydrocodone and an empty sella bottle. She's been doing lifestyle with exercise and weight loss and does feel better. She still gets intermittent headaches she takes Maxalt and hydrocodone as a rescue a handful a month She does get a prescription every month but her meds will expire and she takes them back to the pharmacy for disposal. She's been well this flu season although her child had the flu. She sees a neurologist every 3-6 months. Some of her nerve pain is improved since she has lost some weight. ROS: See pertinent positives and negatives per HPI.  Past Medical History:  Diagnosis Date  . ADHD (attention deficit hyperactivity disorder)   . Allergic rhinitis, cause unspecified   . Anal fissure   . Anemia   . Atypical chest pain   . H/O headache following lumbar puncture    done to R/O MS  . Headache(784.0)   . History of Clostridium difficile infection    2007  . Hx of varicella   . IBS (irritable bowel syndrome)   . Interstitial cystitis   . Kidney stones    11 16   . Low back pain   . Mononeuritis of lower limb, unspecified   . Myalgia and myositis, unspecified   . PVC (premature ventricular contraction)    had negative ECHO  . Screening examination for pulmonary tuberculosis   . Urinary tract infection, site not specified   . Varicose veins    ablation    Family History  Problem Relation Age of Onset  . Hypertension Mother   . Hyperlipidemia Father   . Asthma Father   . Hyperlipidemia Mother     controlled with lsi    Social History   Social History  . Marital status: Married    Spouse name: N/A  . Number of children: N/A  . Years of education: N/A   Social History Main Topics  . Smoking  status: Never Smoker  . Smokeless tobacco: Not on file  . Alcohol use No  . Drug use: No  . Sexual activity: Yes   Other Topics Concern  . Not on file   Social History Narrative    graduated [employer].   Now married spring summer 15  Husband a Curator    2 hh  [name]    Pet  2 dogs and cat exposure   ? 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 amphetamine-dextroamphetamine (ADDERALL) 20 MG tablet Take 1 tablet (20 mg total) by mouth 2 (two) times daily. 60 tablet 0  . gabapentin (NEURONTIN) 800 MG tablet Take 800 mg by mouth 5 (five) times daily.    Marland Kitchen HYDROcodone-acetaminophen (NORCO) 10-325 MG tablet Take 1 tablet by mouth every 6 (six) hours as needed.    Marland Kitchen  ondansetron (ZOFRAN) 8 MG tablet Take 1 tablet as needed at the onset of nausea    . rizatriptan (MAXALT) 10 MG tablet Take 10 mg by mouth as needed for migraine. May repeat in 2 hours if needed    . amphetamine-dextroamphetamine (ADDERALL) 20 MG tablet Take 1 tablet (20 mg total) by mouth 2 (two) times daily. 60 tablet 0  . amphetamine-dextroamphetamine (ADDERALL) 20 MG tablet Take 1 tablet (20 mg total) by mouth 2 (two) times daily. 60 tablet 0  . montelukast (SINGULAIR) 10 MG tablet TAKE 1 TABLET BY MOUTH AT BEDTIME. (Patient not taking: Reported on 08/12/2016) 90 tablet 1  . carisoprodol (SOMA) 350 MG tablet Take 350 mg by mouth 3 (three) times daily as needed for muscle spasms.    Marland Kitchen. zolpidem (AMBIEN) 10 MG tablet Take 10 mg by mouth at bedtime as needed for sleep.     No facility-administered medications prior to visit.      EXAM:  BP 112/82 (BP Location: Right  Arm, Patient Position: Sitting, Cuff Size: Normal)   Temp 98.2 F (36.8 C) (Oral)   Wt 177 lb (80.3 kg)   BMI 27.31 kg/m   Body mass index is 27.31 kg/m.  GENERAL: vitals reviewed and listed above, alert, oriented, appears well hydrated and in no acute distress HEENT: atraumatic, conjunctiva  clear, no obvious abnormalities on inspection of external nose and earsPSYCH: pleasant and cooperative, no obvious depression or anxiety Wt Readings from Last 3 Encounters:  08/12/16 177 lb (80.3 kg)  04/09/16 186 lb (84.4 kg)  10/11/15 192 lb 3.2 oz (87.2 kg)    ASSESSMENT AND PLAN:  Discussed the following assessment and plan:  Hyperlipidemia, unspecified hyperlipidemia type - check in june and ov  Medication management  Attention deficit hyperactivity disorder (ADHD), unspecified ADHD type  Abnormal drug screen - cannot explain    patient appropriate.  Cannot explain to UDS that had barbiturate in we went 3 medications potential and she has never used one for rescue. She does get shots in her neck at times. She is no longer taking Soma and uses as needed hydrocodone and nightly gabapentin. There should be no cross reaction and she is not taking Excedrin Migraine at this time. She's continuing to do healthy weight loss and exercise should help her cholesterol. Plan fasting lipid panel and med check in June 2018. Or as needed. We can repeat UDS at that time okay to refill 2 more months. While she is here.  [facility] reviewed and consistent with her history. However she is getting 120 of hydrocodone 10/325 prescribed and dispensed each month. Per  [facility].  She has worked hard on lifestyle changes in regard to her lipids will recheck this in June hopefully significantly improved. Family history moms cholesterol got better with lifestyle father of the lead was on medicine. No family history of premature vascular events. -Patient advised to return or notify health care team  if  symptoms worsen ,persist or new concerns arise.  Patient Instructions  Fasting lipid panel in June and then OV and med check . Keep us informed . About meds supplements.  Continue       Neta MendsWanda K. Panosh M.D.

## 2016-08-12 NOTE — Progress Notes (Signed)
Chief Complaint  Patient presents with  . Follow-up    HPI: Alexandra [name] 31 y.o.  ROS: See pertinent positives and negatives per HPI.  Past Medical History:  Diagnosis Date  . ADHD (attention deficit hyperactivity disorder)   . Allergic rhinitis, cause unspecified   . Anal fissure   . Anemia   . Atypical chest pain   . H/O headache following lumbar puncture    done to R/O MS  . Headache(784.0)   . History of Clostridium difficile infection    2007  . Hx of varicella   . IBS (irritable bowel syndrome)   . Interstitial cystitis   . Kidney stones    11 16   . Low back pain   . Mononeuritis of lower limb, unspecified   . Myalgia and myositis, unspecified   . PVC (premature ventricular contraction)    had negative ECHO  . Screening examination for pulmonary tuberculosis   . Urinary tract infection, site not specified   . Varicose veins    ablation    Family History  Problem Relation Age of Onset  . Hypertension Mother   . Hyperlipidemia Father   . Asthma Father   . Hyperlipidemia Mother     controlled with lsi    Social History   Social History  . Marital status: Married    Spouse name: N/A  . Number of children: N/A  . Years of education: N/A   Social History Main Topics  . Smoking status: Never Smoker  . Smokeless tobacco: Not on file  . Alcohol use No  . Drug use: No  . Sexual activity: Yes   Other Topics Concern  . Not on file   Social History Narrative    graduated [employer].   Now married spring summer 15  Husband a Curator    2 hh  [name]    Pet  2 dogs and cat exposure   ? 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     Insurance at [employer] healthy baby 8 16 studying for coding certification fall 16   [employer]  [employer]     Outpatient Medications Prior to  Visit  Medication Sig Dispense Refill  . amphetamine-dextroamphetamine (ADDERALL) 20 MG tablet Take 1 tablet (20 mg total) by mouth 2 (two) times daily. 60 tablet 0  . amphetamine-dextroamphetamine (ADDERALL) 20 MG tablet Take 1 tablet (20 mg total) by mouth 2 (two) times daily. 60 tablet 0  . amphetamine-dextroamphetamine (ADDERALL) 20 MG tablet Take 1 tablet (20 mg total) by mouth 2 (two) times daily. 60 tablet 0  . gabapentin (NEURONTIN) 800 MG tablet Take 800 mg by mouth 5 (five) times daily.    Marland Kitchen HYDROcodone-acetaminophen (NORCO) 10-325 MG tablet Take 1 tablet by mouth every 6 (six) hours as needed.    . ondansetron (ZOFRAN) 8 MG tablet Take 1 tablet as needed at the onset of nausea    . rizatriptan (MAXALT) 10 MG tablet Take 10 mg by mouth as needed for migraine. May repeat in 2 hours if needed    . montelukast (SINGULAIR) 10 MG tablet TAKE 1 TABLET BY MOUTH AT BEDTIME. (Patient not taking: Reported on 08/12/2016) 90 tablet 1  . zolpidem (AMBIEN) 10 MG tablet Take 10 mg by mouth  at bedtime as needed for sleep.    . carisoprodol (SOMA) 350 MG tablet Take 350 mg by mouth 3 (three) times daily as needed for muscle spasms.     No facility-administered medications prior to visit.      EXAM:  BP 112/82 (BP Location: Right Arm, Patient Position: Sitting, Cuff Size: Normal)   Temp 98.2 F (36.8 C) (Oral)   Wt 177 lb (80.3 kg)   BMI 27.31 kg/m   Body mass index is 27.31 kg/m.  GENERAL: vitals reviewed and listed above, alert, oriented, appears well hydrated and in no acute distress HEENT: atraumatic, conjunctiva  clear, no obvious abnormalities on inspection of external nose and ears OP : no lesion edema or exudate  NECK: no obvious masses on inspection palpation  LUNGS: clear to auscultation bilaterally, no wheezes, rales or rhonchi, good air movement CV: HRRR, no clubbing cyanosis or  peripheral edema nl cap refill  MS: moves all extremities without noticeable focal   abnormality PSYCH: pleasant and cooperative, no obvious depression or anxiety Wt Readings from Last 3 Encounters:  08/12/16 177 lb (80.3 kg)  04/09/16 186 lb (84.4 kg)  10/11/15 192 lb 3.2 oz (87.2 kg)    ASSESSMENT AND PLAN:  Discussed the following assessment and plan:  No diagnosis found. Sees  Neuro  Every 3-6 months  .   Due march.   Not need to go .  -Patient advised to return or notify health care team  if symptoms worsen ,persist or new concerns arise.  There are no Patient Instructions on file for this visit.   Neta MendsWanda K. Roseland Braun M.D.

## 2016-08-12 NOTE — Progress Notes (Signed)
Pre visit review using our clinic review tool, if applicable. No additional management support is needed unless otherwise documented below in the visit note. 

## 2016-10-01 ENCOUNTER — Encounter: Payer: Self-pay | Admitting: Internal Medicine

## 2016-10-01 MED ORDER — MONTELUKAST SODIUM 10 MG PO TABS: 10.0000 mg | ORAL_TABLET | Freq: Every day | ORAL | 0 refills | Status: DC

## 2016-10-25 ENCOUNTER — Encounter: Payer: Self-pay | Admitting: Family Medicine

## 2016-10-25 ENCOUNTER — Ambulatory Visit (INDEPENDENT_AMBULATORY_CARE_PROVIDER_SITE_OTHER): Payer: Managed Care, Other (non HMO) | Admitting: Family Medicine

## 2016-10-25 ENCOUNTER — Encounter: Payer: Self-pay | Admitting: Internal Medicine

## 2016-10-25 VITALS — BP 118/84 | HR 74 | Temp 97.6°F | Ht 67.5 in | Wt 178.9 lb

## 2016-10-25 DIAGNOSIS — R0602 Shortness of breath: Secondary | ICD-10-CM | POA: Diagnosis not present

## 2016-10-25 DIAGNOSIS — R0789 Other chest pain: Secondary | ICD-10-CM

## 2016-10-25 LAB — CBC
HEMATOCRIT: 42.4 % (ref 36.0–46.0)
HEMOGLOBIN: 14.4 g/dL (ref 12.0–15.0)
MCHC: 33.9 g/dL (ref 30.0–36.0)
MCV: 88.5 fl (ref 78.0–100.0)
Platelets: 209 10*3/uL (ref 150.0–400.0)
RBC: 4.79 Mil/uL (ref 3.87–5.11)
RDW: 12.7 % (ref 11.5–15.5)
WBC: 7.7 10*3/uL (ref 4.0–10.5)

## 2016-10-25 LAB — TSH: TSH: 1.04 u[IU]/mL (ref 0.35–4.50)

## 2016-10-25 NOTE — Progress Notes (Signed)
Pre visit review using our clinic review tool, if applicable. No additional management support is needed unless otherwise documented below in the visit note. 

## 2016-10-25 NOTE — Progress Notes (Signed)
HPI:   Acute visit for Atypical CP: -intermittent for a few months -most recent episode this morning -episodes can occur at rest or with activity -symptoms: chest tightness and feeling of not being able to take a deep breath, pain with deep breathing -PMH sig for: exercise induced asthma, allergies, fibromyalgia and scoliosis, gained wt with pregnancy and has related elevated cholesterol -has had some allergy issues the last few weeks, no medications for this -currently under a lot of stress with family stress, new baby 18 months ago, dog is sick -no depression or generalized worry, but does feel overwhelmed at times -denies: SI, DOE, SOB except with events, fevers, chills, malaise, jaw pain, nausea, FH early heart disease, sig family hx  ROS: See pertinent positives and negatives per HPI.  Past Medical History:  Diagnosis Date  . ADHD (attention deficit hyperactivity disorder)   . Allergic rhinitis, cause unspecified   . Anal fissure   . Anemia   . Atypical chest pain   . H/O headache following lumbar puncture    done to R/O MS  . Headache(784.0)   . History of Clostridium difficile infection    2007  . Hx of varicella   . IBS (irritable bowel syndrome)   . Interstitial cystitis   . Kidney stones    11 16   . Low back pain   . Mononeuritis of lower limb, unspecified   . Myalgia and myositis, unspecified   . PVC (premature ventricular contraction)    had negative ECHO  . Screening examination for pulmonary tuberculosis   . Urinary tract infection, site not specified   . Varicose veins    ablation    Past Surgical History:  Procedure Laterality Date  . BREAST SURGERY     augmentation  . ENDOVENOUS ABLATION SAPHENOUS VEIN W/ LASER      Family History  Problem Relation Age of Onset  . Hypertension Mother   . Hyperlipidemia Father   . Asthma Father   . Hyperlipidemia Mother     controlled with lsi    Social History   Social History  . Marital status:  Married    Spouse name: N/A  . Number of children: N/A  . Years of education: N/A   Social History Main Topics  . Smoking status: Never Smoker  . Smokeless tobacco: Never Used  . Alcohol use No  . Drug use: No  . Sexual activity: Yes   Other Topics Concern  . None   Social History Narrative    graduated [employer].   Now married spring summer 15  Husband a Curator    2 hh  [name]    Pet  2 dogs and cat exposure   ? 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     Insurance at [employer] healthy baby 8 16 studying for coding certification fall 16   [employer]  [employer]      Current Outpatient Prescriptions:  .  amitriptyline (ELAVIL) 25 MG tablet, Take 75 mg by mouth at bedtime., Disp: , Rfl:  .  amphetamine-dextroamphetamine (ADDERALL) 20 MG tablet, Take 1 tablet (20 mg total) by mouth 2 (two) times daily., Disp: 60 tablet, Rfl: 0 .  amphetamine-dextroamphetamine (ADDERALL) 20 MG tablet, Take 1 tablet (20 mg total) by mouth  2 (two) times daily., Disp: 60 tablet, Rfl: 0 .  amphetamine-dextroamphetamine (ADDERALL) 20 MG tablet, Take 1 tablet (20 mg total) by mouth 2 (two) times daily., Disp: 60 tablet, Rfl: 0 .  gabapentin (NEURONTIN) 800 MG tablet, Take 800 mg by mouth 5 (five) times daily., Disp: , Rfl:  .  HYDROcodone-acetaminophen (NORCO) 10-325 MG tablet, Take 1 tablet by mouth every 6 (six) hours as needed., Disp: , Rfl:  .  montelukast (SINGULAIR) 10 MG tablet, Take 1 tablet (10 mg total) by mouth at bedtime., Disp: 90 tablet, Rfl: 0 .  NUVARING 0.12-0.015 MG/24HR vaginal ring, , Disp: , Rfl:  .  ondansetron (ZOFRAN) 8 MG tablet, Take 1 tablet as needed at the onset of nausea, Disp: , Rfl:  .  rizatriptan (MAXALT) 10 MG tablet, Take 10 mg by mouth as needed for migraine. May repeat in 2 hours if needed,  Disp: , Rfl:   EXAM:  Vitals:   10/25/16 1341  BP: 118/84  Pulse: 74  Temp: 97.6 F (36.4 C)    Body mass index is 27.61 kg/m.  GENERAL: vitals reviewed and listed above, alert, oriented, appears well hydrated and in no acute distress  HEENT: atraumatic, conjunttiva clear, no obvious abnormalities on inspection of external nose and ears  NECK: no obvious masses on inspection  LUNGS: clear to auscultation bilaterally, no wheezes, rales or rhonchi, good air movement  CV: HRRR, no peripheral edema  MS: moves all extremities without noticeable abnormality, she has reproducible chest wall pain L upper costal cartilage  PSYCH: pleasant and cooperative, no obvious depression or anxiety  ASSESSMENT AND PLAN:  Discussed the following assessment and plan:  Atypical chest pain - Plan: EKG 12-Lead  -we discussed possible serious and likely etiologies, workup and treatment, treatment risks and return precautions; suspect chest wall pain with breathing issues related to anxiety about the pain vs mild bronchitis most likely -advised EKG, CXR, TSH, Ddimer since on birth control, CBC and consideration cardiac stress testing given HLD/wt issues to r/o other, aleve for possible chest wall pain and zyrtec and alb prn for allergies/asthma -after this discussion, Alexandra [name] opted for EKG, CXR, labs, prn aleve, prn albuterol -EKG with sinus rhythm -she agreed to stress test if symptoms persist -advised consideration CBT for stress  -follow up advised with PCP in 2-4 weeks, sooner as needed -of course, we advised Alexandra [name]  to return or notify a doctor immediately if symptoms worsen or persist or new concerns arise.   There are no Patient Instructions on file for this visit.  Alexandra [name] R., DO

## 2016-10-25 NOTE — Patient Instructions (Signed)
BEFORE YOU LEAVE: -xray sheet -labs -EKG -counseling brochure -follow up with your doctor in 2-4 weeks  Try aleve as needed for pain.  Start zyrtec daily in the evening.  Try albuterol as needed for trouble breathing and 15 minutes prior to exercise.  Consider counseling to help stress.  We have ordered labs or studies at this visit. It can take up to 1-2 weeks for results and processing. IF results require follow up or explanation, we will call you with instructions. Clinically stable results will be released to your [facility]. If you have not heard from [place] or cannot find your results in [facility] in 2 weeks please contact our office at [phone].  If you are not yet signed up for [facility], please consider signing up.  I hope you are feeling better soon! Seek care immediately if worsening, new concerns or you are not improving with treatment.

## 2016-10-26 LAB — D-DIMER, QUANTITATIVE: D-Dimer, Quant: <0.19 mcg/mL FEU (ref <0.50)

## 2016-10-28 ENCOUNTER — Telehealth: Payer: Self-pay | Admitting: Internal Medicine

## 2016-10-28 MED ORDER — ALBUTEROL SULFATE HFA 108 (90 BASE) MCG/ACT IN AERS: 2.0000 | INHALATION_SPRAY | Freq: Four times a day (QID) | RESPIRATORY_TRACT | 0 refills | Status: DC | PRN

## 2016-10-28 NOTE — Telephone Encounter (Signed)
Inhaler sent 

## 2016-10-28 NOTE — Telephone Encounter (Signed)
° ° ° °  Pt call to say she saw Dr Selena Batten on Friday and was told a RX inhaler was going to be called in for her and she check with the pharmacy and they don't have anything     Pharmacy [facility] [address]

## 2016-10-29 ENCOUNTER — Other Ambulatory Visit: Payer: Self-pay | Admitting: *Deleted

## 2016-10-29 ENCOUNTER — Ambulatory Visit (INDEPENDENT_AMBULATORY_CARE_PROVIDER_SITE_OTHER)
Admission: RE | Admit: 2016-10-29 | Discharge: 2016-10-29 | Disposition: A | Discharge status: Home / Self Care | Payer: Managed Care, Other (non HMO) | Source: Ambulatory Visit | Attending: Family Medicine | Admitting: Family Medicine

## 2016-10-29 ENCOUNTER — Telehealth: Payer: Self-pay | Admitting: *Deleted

## 2016-10-29 DIAGNOSIS — R0789 Other chest pain: Secondary | ICD-10-CM

## 2016-10-29 MED ORDER — PREDNISONE 20 MG PO TABS: 40.0000 mg | ORAL_TABLET | Freq: Every day | ORAL | 0 refills | Status: DC

## 2016-10-29 NOTE — Telephone Encounter (Signed)
Elease Hashimoto called from the [facility] x-ray dept stating the pt is there now for a chest x-ray and there is not an order in the system.  Per Dr Selena Batten the order was entered for a chest x-ray due to atypical chest pain and Elease Hashimoto noted this in the system.

## 2016-11-04 ENCOUNTER — Other Ambulatory Visit: Payer: Self-pay | Admitting: Internal Medicine

## 2016-11-04 NOTE — Telephone Encounter (Signed)
Pt request refill amphetamine-dextroamphetamine (ADDERALL) 20 MG tablet °3 mo supply °

## 2016-11-05 NOTE — Telephone Encounter (Signed)
Please advise 

## 2016-11-05 NOTE — Telephone Encounter (Signed)
Ok to refill for 90 days    How is her  Chest doing? I see that she  saw dr Selena BattenKim.  If ongoing  Problem advise fu OV.

## 2016-11-06 MED ORDER — AMPHETAMINE-DEXTROAMPHETAMINE 20 MG PO TABS: 20.0000 mg | ORAL_TABLET | Freq: Two times a day (BID) | ORAL | 0 refills | Status: DC

## 2016-11-06 NOTE — Telephone Encounter (Signed)
Pt following up on refill request.  Pt states she has already made a follow up visit with Dr Fabian SharpPanosh for next week. Thank you!!

## 2016-11-06 NOTE — Telephone Encounter (Signed)
Printed for WP to sign. 

## 2016-11-06 NOTE — Telephone Encounter (Signed)
Pt notified by telephone to pick up at the front desk.

## 2016-11-13 ENCOUNTER — Other Ambulatory Visit: Payer: Self-pay | Admitting: Specialist

## 2016-11-13 DIAGNOSIS — G43709 Chronic migraine without aura, not intractable, without status migrainosus: Secondary | ICD-10-CM

## 2016-11-13 DIAGNOSIS — R202 Paresthesia of skin: Secondary | ICD-10-CM

## 2016-11-13 DIAGNOSIS — R9389 Abnormal findings on diagnostic imaging of other specified body structures: Secondary | ICD-10-CM

## 2016-11-14 NOTE — Progress Notes (Signed)
Chief Complaint  Patient presents with  . Follow-up    HPI: Alexandra [name] 31 y.o.   Seen dr Selena Batten for atypical chest pain  And sob feeling  is here for fu .   Some better comes and goes  More breath catches  On multiple meds GIVEN  INHALER  PRE EXERCISE  Not sure new migraine  Med    ? If sob zonasidmide .  So stopped for now   Allegra     Out of insulari  Pharmacy  didn't get this  Has nose itchy eye itch and nasal congestion   r no sig cough other iwes at this time  Had ekg cxray and labs   ROS: See pertinent positives and negatives per HPI. No syncope  Has been exrcising no fever  Under evalu for  Ongoing migraines  No pnd  Nocturnal cough sob  Past Medical History:  Diagnosis Date  . ADHD (attention deficit hyperactivity disorder)   . Allergic rhinitis, cause unspecified   . Anal fissure   . Anemia   . Atypical chest pain   . H/O headache following lumbar puncture    done to R/O MS  . Headache(784.0)   . History of Clostridium difficile infection    2007  . Hx of varicella   . IBS (irritable bowel syndrome)   . Interstitial cystitis   . Kidney stones    11 16   . Low back pain   . Mononeuritis of lower limb, unspecified   . Myalgia and myositis, unspecified   . PVC (premature ventricular contraction)    had negative ECHO  . Screening examination for pulmonary tuberculosis   . Urinary tract infection, site not specified   . Varicose veins    ablation    Family History  Problem Relation Age of Onset  . Hypertension Mother   . Hyperlipidemia Father   . Asthma Father   . Hyperlipidemia Mother        controlled with lsi    Social History   Social History  . Marital status: Married    Spouse name: N/A  . Number of children: N/A  . Years of education: N/A   Social History Main Topics  . Smoking status: Never Smoker  . Smokeless tobacco: Never Used  . Alcohol use No  . Drug use: No  . Sexual activity: Yes   Other Topics Concern  . None    Social History Narrative    graduated [employer].   Now married spring summer 15  Husband a Curator    2 hh  [name]    Pet  2 dogs and cat exposure   ? 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 amitriptyline (ELAVIL) 25 MG tablet Take 75 mg by mouth at bedtime.    Marland Kitchen amphetamine-dextroamphetamine (ADDERALL) 20 MG tablet Take 1 tablet (20 mg total) by mouth 2 (two) times daily. 60 tablet 0  . amphetamine-dextroamphetamine (ADDERALL) 20 MG tablet Take 1 tablet (20 mg total) by mouth 2 (two) times daily. 60  tablet 0  . amphetamine-dextroamphetamine (ADDERALL) 20 MG tablet Take 1 tablet (20 mg total) by mouth 2 (two) times daily. 60 tablet 0  . gabapentin (NEURONTIN) 800 MG tablet Take 800 mg by mouth 5 (five) times daily.    Marland Kitchen HYDROcodone-acetaminophen (NORCO) 10-325 MG tablet Take 1 tablet by mouth every 6 (six) hours as needed.    Marland Kitchen NUVARING 0.12-0.015 MG/24HR vaginal ring     . ondansetron (ZOFRAN) 8 MG tablet Take 1 tablet as needed at the onset of nausea    . predniSONE (DELTASONE) 20 MG tablet Take 2 tablets (40 mg total) by mouth daily with breakfast. 8 tablet 0  . rizatriptan (MAXALT) 10 MG tablet Take 10 mg by mouth as needed for migraine. May repeat in 2 hours if needed    . albuterol (PROVENTIL HFA;VENTOLIN HFA) 108 (90 Base) MCG/ACT inhaler Inhale 2 puffs into the lungs every 6 (six) hours as needed. 1 Inhaler 0  . montelukast (SINGULAIR) 10 MG tablet Take 1 tablet (10 mg total) by mouth at bedtime. 90 tablet 0   No facility-administered medications prior to visit.      EXAM:  BP 110/80 (BP Location: Right Arm, Patient  Position: Sitting, Cuff Size: Normal)   Pulse 63   Ht 5' 7.5" (1.715 m)   Wt 182 lb 3.2 oz (82.6 kg)   LMP 11/15/2016   Breastfeeding? No   BMI 28.12 kg/m   Body mass index is 28.12 kg/m.  GENERAL: vitals reviewed and listed above, alert, oriented, appears well hydrated and in no acute distress congested    Quiet respirations.  HEENT: atraumatic, conjunctiva  clear, no obvious abnormalities on inspection of external nose and ears tmx clear  Nares congeseted   Face min tender OP : no lesion edema or exudate  NECK: no obvious masses on inspection palpation  LUNGS: clear to auscultation bilaterally, no wheezes, rales or rhonchi, CV: HRRR, no clubbing cyanosis or  peripheral edema nl cap refill  Abdomen:  Sof,t normal bowel sounds without hepatosplenomegaly, no guarding rebound or masses no CVA tenderness MS: moves all extremities without noticeable focal  abnormality PSYCH: pleasant and cooperative, no obvious depression or anxiety  ekg rsrv sinus rhythym.  From last visit  Lab reviewed and neg d dimer and cxray hyperinflated   ASSESSMENT AND PLAN:  Discussed the following assessment and plan:  SOB (shortness of breath) - suspect asthmatic  category poss anxiety less likely  hyperinflation on c xray plan pfts sinfulari flonase consider adding  breo or symbicort etc 1 m rov - Plan: Pulmonary function test  Seasonal allergic rhinitis, unspecified trigger - Plan: Pulmonary function test CHECK  [facility] registrly   consistent with hx   12 2 3 4 5  adderalll, hydrocodone from neuro 120 12 jan and feb .  None since  -Patient advised to return or notify health care team  if symptoms worsen ,persist or new concerns arise.  Patient Instructions  This indeed may be a version of asthma triggered by allergy pollen season. Get back on her Singulair add nasal steroids either Flonase or Nasacort every day Can still use albuterol as needed You'll be contacted about getting full pulmonary function test to  help delineate situation or if typical of asthma. There are other inhalers that can be used for asthma prevention and symptoms that we could add you are not getting better or indicated. If this is allergic asthma usually they're flareups are seasonal and medication is not necessarily needed year round.  Follow-up in about a month or as needed.    Pulmonary Function Tests Pulmonary function tests (PFTs) are used to measure how well your lungs work, find out what is causing your lung problems, and figure out the best treatment for you. You may have PFTs:  When you have an illness involving the lungs.  To follow changes in your lung function over time if you have a chronic lung disease.  If you are an IT trainerindustrial plant worker. This checks the effects of being exposed to chemicals over a long period of time.  To check lung function before having surgery or other procedures.  To check your lungs if you smoke.  To check if prescribed medicines or treatments are helping your lungs. Your results will be compared to the expected lung function of someone with healthy lungs who is similar to you in:  Age.  Gender.  Height.  Weight.  Race or ethnicity. This is done to show how your lungs compare to normal lung function (percent predicted). This is how your health care provider knows if your lung function is normal or not. If you have had PFTs done before, your health care provider will compare your current results with past results. This shows if your lung function is better, worse, or the same as before. Tell a health care provider about:  Any allergies you have.  All medicines you are taking, including inhaler or nebulizer medicines, vitamins, herbs, eye drops, creams, and over-the-counter medicines.  Any blood disorders you have.  Any surgeries you have had, especially recent eye surgery, abdominal surgery, or chest surgery. These can make PFTs difficult or unsafe.  Any medical  conditions you have, including chest pain or heart problems, tuberculosis, or respiratory infections such as pneumonia, a cold, or the flu.  Any fear of being in closed spaces (claustrophobia). Some of your tests may be in a closed space. What are the risks? Generally, this is a safe procedure. However, problems may occur, including:  Light-headedness due to over-breathing (hyperventilation).  An asthma attack from deep breathing.  A collapsed lung. What happens before the procedure?  Take over-the-counter and prescription medicines only as told by your health care provider. If you take inhaler or nebulizer medicines, ask your health care provider which medicines you should take on the day of your testing. Some inhaler medicines may interfere with PFTs if they are taken shortly before the tests.  Follow your health care provider's instructions on eating and drinking restrictions. This may include avoiding eating large meals and drinking alcohol before the testing.  Do not use any products that contain nicotine or tobacco, such as cigarettes and e-cigarettes. If you need help quitting, ask your health care provider.  Wear comfortable clothing that will not interfere with breathing. What happens during the procedure?  You will be given a soft nose clip to wear. This is done so all of your breaths will go through your mouth instead of your nose.  You will be given a germ-free (sterile) mouthpiece. It will be attached to a machine that measures your breathing (spirometer).  You will be asked to do various breathing maneuvers. The maneuvers will be done by breathing in (inhaling) and breathing out (exhaling). You may be asked to repeat the maneuvers several times before the testing is done.  It is important to follow the instructions exactly to get accurate results. Make sure to blow as hard and as fast as you can when you are told to do so.  You may be given a medicine that makes the small  air passages in your lungs larger (bronchodilator) after testing has been done. This medicine will make it easier for you to breathe.  The tests will be repeated after the bronchodilator has taken effect.  You will be monitored carefully during the procedure for faintness, dizziness, trouble breathing, or any other problems. The procedure may vary among health care providers and hospitals. What happens after the procedure?  It is up to you to get your test results. Ask your health care provider, or the department that is doing the tests, when your results will be ready. After you have received your test results, talk with your health care provider about treatment options, if necessary. Summary  Pulmonary function tests (PFTs) are used to measure how well your lungs work, find out what is causing your lung problems, and figure out the best treatment for you.  Wear comfortable clothing that will not interfere with breathing.  It is up to you to get your test results. After you have received them, talk with your health care provider about treatment options, if necessary. This information is not intended to replace advice given to you by your health care provider. Make sure you discuss any questions you have with your health care provider. Document Released: 02/08/2004 Document Revised: 05/09/2016 Document Reviewed: 05/09/2016 Elsevier Interactive Patient Education  2017 [employer].     [place]. Ashyr Hedgepath M.D.

## 2016-11-15 ENCOUNTER — Encounter: Payer: Self-pay | Admitting: Internal Medicine

## 2016-11-15 ENCOUNTER — Ambulatory Visit (INDEPENDENT_AMBULATORY_CARE_PROVIDER_SITE_OTHER): Payer: Managed Care, Other (non HMO) | Admitting: Internal Medicine

## 2016-11-15 VITALS — BP 110/80 | HR 63 | Ht 67.5 in | Wt 182.2 lb

## 2016-11-15 DIAGNOSIS — J302 Other seasonal allergic rhinitis: Secondary | ICD-10-CM

## 2016-11-15 DIAGNOSIS — R0602 Shortness of breath: Secondary | ICD-10-CM

## 2016-11-15 MED ORDER — ALBUTEROL SULFATE HFA 108 (90 BASE) MCG/ACT IN AERS: 2.0000 | INHALATION_SPRAY | Freq: Four times a day (QID) | RESPIRATORY_TRACT | 1 refills | Status: AC | PRN

## 2016-11-15 MED ORDER — MONTELUKAST SODIUM 10 MG PO TABS: 10.0000 mg | ORAL_TABLET | Freq: Every day | ORAL | 2 refills | Status: DC

## 2016-11-15 NOTE — Patient Instructions (Addendum)
This indeed may be a version of asthma triggered by allergy pollen season. Get back on her Singulair add nasal steroids either Flonase or Nasacort every day Can still use albuterol as needed You'll be contacted about getting full pulmonary function test to help delineate situation or if typical of asthma. There are other inhalers that can be used for asthma prevention and symptoms that we could add you are not getting better or indicated. If this is allergic asthma usually they're flareups are seasonal and medication is not necessarily needed year round. Follow-up in about a month or as needed.    Pulmonary Function Tests Pulmonary function tests (PFTs) are used to measure how well your lungs work, find out what is causing your lung problems, and figure out the best treatment for you. You may have PFTs:  When you have an illness involving the lungs.  To follow changes in your lung function over time if you have a chronic lung disease.  If you are an IT trainer. This checks the effects of being exposed to chemicals over a long period of time.  To check lung function before having surgery or other procedures.  To check your lungs if you smoke.  To check if prescribed medicines or treatments are helping your lungs. Your results will be compared to the expected lung function of someone with healthy lungs who is similar to you in:  Age.  Gender.  Height.  Weight.  Race or ethnicity. This is done to show how your lungs compare to normal lung function (percent predicted). This is how your health care provider knows if your lung function is normal or not. If you have had PFTs done before, your health care provider will compare your current results with past results. This shows if your lung function is better, worse, or the same as before. Tell a health care provider about:  Any allergies you have.  All medicines you are taking, including inhaler or nebulizer medicines,  vitamins, herbs, eye drops, creams, and over-the-counter medicines.  Any blood disorders you have.  Any surgeries you have had, especially recent eye surgery, abdominal surgery, or chest surgery. These can make PFTs difficult or unsafe.  Any medical conditions you have, including chest pain or heart problems, tuberculosis, or respiratory infections such as pneumonia, a cold, or the flu.  Any fear of being in closed spaces (claustrophobia). Some of your tests may be in a closed space. What are the risks? Generally, this is a safe procedure. However, problems may occur, including:  Light-headedness due to over-breathing (hyperventilation).  An asthma attack from deep breathing.  A collapsed lung. What happens before the procedure?  Take over-the-counter and prescription medicines only as told by your health care provider. If you take inhaler or nebulizer medicines, ask your health care provider which medicines you should take on the day of your testing. Some inhaler medicines may interfere with PFTs if they are taken shortly before the tests.  Follow your health care provider's instructions on eating and drinking restrictions. This may include avoiding eating large meals and drinking alcohol before the testing.  Do not use any products that contain nicotine or tobacco, such as cigarettes and e-cigarettes. If you need help quitting, ask your health care provider.  Wear comfortable clothing that will not interfere with breathing. What happens during the procedure?  You will be given a soft nose clip to wear. This is done so all of your breaths will go through your mouth instead of  your nose.  You will be given a germ-free (sterile) mouthpiece. It will be attached to a machine that measures your breathing (spirometer).  You will be asked to do various breathing maneuvers. The maneuvers will be done by breathing in (inhaling) and breathing out (exhaling). You may be asked to repeat the  maneuvers several times before the testing is done.  It is important to follow the instructions exactly to get accurate results. Make sure to blow as hard and as fast as you can when you are told to do so.  You may be given a medicine that makes the small air passages in your lungs larger (bronchodilator) after testing has been done. This medicine will make it easier for you to breathe.  The tests will be repeated after the bronchodilator has taken effect.  You will be monitored carefully during the procedure for faintness, dizziness, trouble breathing, or any other problems. The procedure may vary among health care providers and hospitals. What happens after the procedure?  It is up to you to get your test results. Ask your health care provider, or the department that is doing the tests, when your results will be ready. After you have received your test results, talk with your health care provider about treatment options, if necessary. Summary  Pulmonary function tests (PFTs) are used to measure how well your lungs work, find out what is causing your lung problems, and figure out the best treatment for you.  Wear comfortable clothing that will not interfere with breathing.  It is up to you to get your test results. After you have received them, talk with your health care provider about treatment options, if necessary. This information is not intended to replace advice given to you by your health care provider. Make sure you discuss any questions you have with your health care provider. Document Released: 02/08/2004 Document Revised: 05/09/2016 Document Reviewed: 05/09/2016 Elsevier Interactive Patient Education  2017 [employer]

## 2016-11-21 ENCOUNTER — Ambulatory Visit
Admission: RE | Admit: 2016-11-21 | Discharge: 2016-11-21 | Disposition: A | Discharge status: Home / Self Care | Payer: Managed Care, Other (non HMO) | Source: Ambulatory Visit | Attending: Specialist | Admitting: Specialist

## 2016-11-21 DIAGNOSIS — G43709 Chronic migraine without aura, not intractable, without status migrainosus: Secondary | ICD-10-CM

## 2016-11-21 DIAGNOSIS — R202 Paresthesia of skin: Secondary | ICD-10-CM

## 2016-11-21 DIAGNOSIS — R9389 Abnormal findings on diagnostic imaging of other specified body structures: Secondary | ICD-10-CM

## 2016-12-11 ENCOUNTER — Other Ambulatory Visit (INDEPENDENT_AMBULATORY_CARE_PROVIDER_SITE_OTHER): Payer: Managed Care, Other (non HMO)

## 2016-12-11 DIAGNOSIS — E785 Hyperlipidemia, unspecified: Secondary | ICD-10-CM

## 2016-12-11 LAB — LIPID PANEL
CHOL/HDL RATIO: 4
CHOLESTEROL: 242 mg/dL — AB (ref 0–200)
HDL: 62.9 mg/dL (ref >39.00)
LDL CALC: 164 mg/dL — AB (ref 0–99)
NonHDL: 179.22
Triglycerides: 78 mg/dL (ref 0.0–149.0)
VLDL: 15.6 mg/dL (ref 0.0–40.0)

## 2016-12-14 NOTE — Progress Notes (Signed)
No chief complaint on file.   HPI: Alexandra [name] 31 y.o. come in for fu meds  Sob  And atypical cp  And  HLD disease management  No one ever called her about pulmonary function tests. However she states that her chest pain and dyspnea she thinks is a lot better him was from stress. She is now begun to exercise to try to get her cholesterol level down and lose some weight in a healthy manner. Currently no coughing. She is also decreased her caffeine. She is exercising 6 days a week. She is taking Neurontin for at night. No current chest pain or other concerns. That are new. ROS: See pertinent positives and negatives per HPI.  Past Medical History:  Diagnosis Date  . ADHD (attention deficit hyperactivity disorder)   . Allergic rhinitis, cause unspecified   . Anal fissure   . Anemia   . Atypical chest pain   . H/O headache following lumbar puncture    done to R/O MS  . Headache(784.0)   . History of Clostridium difficile infection    2007  . Hx of varicella   . IBS (irritable bowel syndrome)   . Interstitial cystitis   . Kidney stones    11 16   . Low back pain   . Mononeuritis of lower limb, unspecified   . Myalgia and myositis, unspecified   . PVC (premature ventricular contraction)    had negative ECHO  . Screening examination for pulmonary tuberculosis   . Urinary tract infection, site not specified   . Varicose veins    ablation    Family History  Problem Relation Age of Onset  . Hypertension Mother   . Hyperlipidemia Father   . Asthma Father   . Hyperlipidemia Mother        controlled with lsi    Social History   Social History  . Marital status: Married    Spouse name: N/A  . Number of children: N/A  . Years of education: N/A   Social History Main Topics  . Smoking status: Never Smoker  . Smokeless tobacco: Never Used  . Alcohol use No  . Drug use: No  . Sexual activity: Yes   Other Topics Concern  . None   Social History Narrative   graduated [employer].   Now married spring summer 15  Husband a Curatormechanic    2 hh  Jacquenette ShoneJulian    Pet  2 dogs and cat exposure   ? 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    med reconciliation [facility].     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 albuterol (PROVENTIL HFA;VENTOLIN HFA) 108 (90 Base) MCG/ACT inhaler Inhale 2 puffs into the lungs every 6 (six) hours as needed. 1 Inhaler 1  . amitriptyline (ELAVIL) 25 MG tablet Take 75 mg by mouth at bedtime.    Marland Kitchen. amphetamine-dextroamphetamine (ADDERALL) 20 MG tablet Take 1 tablet (20 mg total) by mouth 2 (two) times daily. 60 tablet 0  . amphetamine-dextroamphetamine (ADDERALL) 20 MG tablet Take 1 tablet (20 mg total) by mouth 2 (two) times daily. 60 tablet 0  . amphetamine-dextroamphetamine (ADDERALL)  20 MG tablet Take 1 tablet (20 mg total) by mouth 2 (two) times daily. 60 tablet 0  . carisoprodol (SOMA) 350 MG tablet     . etonogestrel-ethinyl estradiol (NUVARING) 0.12-0.015 MG/24HR vaginal ring NuvaRing 0.12 mg -0.015 mg/24 hr vaginal    . gabapentin (NEURONTIN) 800 MG tablet Take 800 mg by mouth 5 (five) times daily.    Marland Kitchen HYDROcodone-acetaminophen (NORCO) 10-325 MG tablet Take 1 tablet by mouth every 6 (six) hours as needed.    . montelukast (SINGULAIR) 10 MG tablet Take 1 tablet (10 mg total) by mouth at bedtime. 90 tablet 2  . NUVARING 0.12-0.015 MG/24HR vaginal ring     . ondansetron (ZOFRAN) 8 MG tablet Take 1 tablet as needed at the onset of nausea    . rizatriptan (MAXALT) 10 MG tablet Take 10 mg by mouth as needed for migraine. May repeat in 2 hours if needed    . predniSONE (DELTASONE) 20 MG tablet Take 2 tablets (40 mg total) by mouth daily with breakfast.  8 tablet 0   No facility-administered medications prior to visit.      EXAM:  BP 102/80 (BP Location: Left Arm, Patient Position: Sitting, Cuff Size: Normal)   Pulse 80   Wt 180 lb 3.2 oz (81.7 kg)   SpO2 99%   BMI 27.81 kg/m   Body mass index is 27.81 kg/m.  GENERAL: vitals reviewed and listed above, alert, oriented, appears well hydrated and in no acute distress HEENT: atraumatic, conjunctiva  clear, no obvious abnormalities on inspection of external nose and ears NECK: no obvious masses on inspection palpation  LUNGS: clear to auscultation bilaterally, no wheezes, rales or rhonchi, good air movement CV: HRRR, no clubbing cyanosis or  peripheral edema nl cap refill  MS: moves all extremities without noticeable focal  abnormality PSYCH: pleasant and cooperative, no obvious depression or anxiety Lab Results  Component Value Date   WBC 7.7 10/25/2016   HGB 14.4 10/25/2016   HCT 42.4 10/25/2016   PLT 209.0 10/25/2016   GLUCOSE 86 08/18/2015   CHOL 242 (H) 12/11/2016   TRIG 78.0 12/11/2016   HDL 62.90 12/11/2016   LDLCALC 164 (H) 12/11/2016   ALT 14 08/18/2015   AST 14 08/18/2015   NA 139 08/18/2015   K 3.8 08/18/2015   CL 105 08/18/2015   CREATININE 0.84 08/18/2015   BUN 13 08/18/2015   CO2 26 08/18/2015   TSH 1.04 10/25/2016   BP Readings from Last 3 Encounters:  12/16/16 102/80  11/15/16 110/80  10/25/16 118/84   Wt Readings from Last 3 Encounters:  12/16/16 180 lb 3.2 oz (81.7 kg)  11/15/16 182 lb 3.2 oz (82.6 kg)  10/25/16 178 lb 14.4 oz (81.1 kg)    ASSESSMENT AND PLAN:  Discussed the following assessment and plan:  Atypical chest pain  Hyperlipidemia, unspecified hyperlipidemia type  SOB (shortness of breath)  Medication management Okay to hold off on proceeding with pulmonary function tests don't know what happened to the referral as the orders in the system. But since she is doing better we can hold off. After continue her Singulair Flonase for  allergy symptoms that may be helping bronchospasm and then continue lifestyle intervention her cholesterol levels are coming down although still elevated for her age group. They're no longer in the medication range and she's not taking medication at this time -Patient advised to return or notify health care team  if  new concerns arise.  Patient Instructions  Glad you are  doing so well Stay on the Singulair and Flonase. Since lung functions haven't been done we can hold on that as long as you are not getting recurrent chest pain or shortness of breath. Keep going with your efforts to reduce your cholesterol levels.  Plan ROV in 3 months or as needed        Neta Mends. Panosh M.D.

## 2016-12-16 ENCOUNTER — Ambulatory Visit (INDEPENDENT_AMBULATORY_CARE_PROVIDER_SITE_OTHER): Payer: Managed Care, Other (non HMO) | Admitting: Internal Medicine

## 2016-12-16 ENCOUNTER — Encounter: Payer: Self-pay | Admitting: Internal Medicine

## 2016-12-16 ENCOUNTER — Ambulatory Visit: Payer: Managed Care, Other (non HMO) | Admitting: Internal Medicine

## 2016-12-16 VITALS — BP 102/80 | HR 80 | Wt 180.2 lb

## 2016-12-16 DIAGNOSIS — R0789 Other chest pain: Secondary | ICD-10-CM | POA: Diagnosis not present

## 2016-12-16 DIAGNOSIS — E785 Hyperlipidemia, unspecified: Secondary | ICD-10-CM

## 2016-12-16 DIAGNOSIS — Z79899 Other long term (current) drug therapy: Secondary | ICD-10-CM | POA: Diagnosis not present

## 2016-12-16 DIAGNOSIS — R0602 Shortness of breath: Secondary | ICD-10-CM | POA: Diagnosis not present

## 2016-12-16 NOTE — Patient Instructions (Addendum)
Glad you are doing so well Stay on the Singulair and Flonase. Since lung functions haven't been done we can hold on that as long as you are not getting recurrent chest pain or shortness of breath. Keep going with your efforts to reduce your cholesterol levels.  Plan ROV in 3 months or as needed

## 2017-02-03 ENCOUNTER — Telehealth: Payer: Self-pay | Admitting: Internal Medicine

## 2017-02-03 MED ORDER — AMPHETAMINE-DEXTROAMPHETAMINE 20 MG PO TABS: 20.0000 mg | ORAL_TABLET | Freq: Two times a day (BID) | ORAL | 0 refills | Status: DC

## 2017-02-03 NOTE — Telephone Encounter (Signed)
Ok I printed

## 2017-02-03 NOTE — Telephone Encounter (Signed)
Please advise 

## 2017-02-03 NOTE — Telephone Encounter (Signed)
Spoke to patient in regards to rx being ready for pick up

## 2017-02-03 NOTE — Telephone Encounter (Signed)
Pt need new Rx for Adderall #90  Pt is aware of 3 business days for refills and someone will call when ready for pick up.

## 2017-03-17 ENCOUNTER — Telehealth: Payer: Self-pay | Admitting: Internal Medicine

## 2017-03-17 NOTE — Telephone Encounter (Signed)
Pt would like to see if she needs to keep the appointment that she rescheduled to 04/10/17 it was for a follow-up pt state that she is not having the chest pain that she was having in June.

## 2017-03-18 ENCOUNTER — Ambulatory Visit: Payer: Managed Care, Other (non HMO) | Admitting: Internal Medicine

## 2017-03-20 NOTE — Telephone Encounter (Signed)
Please advise Dr Fabian Sharp if you feel she needs to keep her upcoming appt 04/10/17 given she is symptom free, thanks.

## 2017-03-21 ENCOUNTER — Encounter: Payer: Self-pay | Admitting: Internal Medicine

## 2017-03-21 NOTE — Telephone Encounter (Signed)
Spoke with patient, aware that we can reschedule her appt with [facility] appt scheduled for 05/01/17 at 930 am with uds Nothing further needed.

## 2017-03-21 NOTE — Telephone Encounter (Signed)
Ok to change  appt to November   will need uds with  next refill of her adderall   And med check then

## 2017-04-02 ENCOUNTER — Other Ambulatory Visit: Payer: Self-pay | Admitting: Specialist

## 2017-04-02 DIAGNOSIS — R9082 White matter disease, unspecified: Secondary | ICD-10-CM

## 2017-04-02 DIAGNOSIS — R9389 Abnormal findings on diagnostic imaging of other specified body structures: Secondary | ICD-10-CM

## 2017-04-10 ENCOUNTER — Ambulatory Visit: Payer: Managed Care, Other (non HMO) | Admitting: Internal Medicine

## 2017-04-15 ENCOUNTER — Ambulatory Visit
Admission: RE | Admit: 2017-04-15 | Discharge: 2017-04-15 | Disposition: A | Discharge status: Home / Self Care | Payer: Managed Care, Other (non HMO) | Source: Ambulatory Visit | Attending: Specialist | Admitting: Specialist

## 2017-04-15 DIAGNOSIS — R9389 Abnormal findings on diagnostic imaging of other specified body structures: Secondary | ICD-10-CM

## 2017-04-15 DIAGNOSIS — R9082 White matter disease, unspecified: Secondary | ICD-10-CM

## 2017-04-15 MED ORDER — GADOBENATE DIMEGLUMINE 529 MG/ML IV SOLN
16.0000 mL | Freq: Once | INTRAVENOUS | Status: AC | PRN
  Administered 2017-04-15: 16 mL via INTRAVENOUS

## 2017-04-28 NOTE — Progress Notes (Signed)
Chief Complaint  Patient presents with  . Medication Refill    Adderall    HPI: Alexandra [name] 31 y.o. come in for Chronic disease management  Med check   1 Chest pain . Not there .  Seems to be better not recurrent able to exercise may have been respiratory 2.  Lipids  lsi monitoring last lipids about 6 months ago trying to eat healthy and exercise.  #3.  Taking Adderall most days bid sometimes     Work at 3  And then  10 12 hours later.   6-7   Hours. 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  Denies Td has narcotic use just for rescue for headaches Is on new for ring is failed a couple of migraine controller medicines including Topamax and other antiseizure medicines.  Updated history she has some white spots on her brain is supposed to get a follow-up MRI with her neurologist soon.   ROS: See pertinent positives and negatives per HPI.  Past Medical History:  Diagnosis Date  . ADHD (attention deficit hyperactivity disorder)   . Allergic rhinitis, cause unspecified   . Anal fissure   . Anemia   . Atypical chest pain   . H/O headache following lumbar puncture    done to R/O MS  . Headache(784.0)   . History of Clostridium difficile infection    2007  . Hx of varicella   . IBS (irritable bowel syndrome)   . Interstitial cystitis   . Kidney stones    11 16   . Low back pain   . Mononeuritis of lower limb, unspecified   . Myalgia and myositis, unspecified   . PVC (premature ventricular contraction)    had negative ECHO  . Screening examination for pulmonary tuberculosis   . Urinary tract infection, site not specified   . Varicose veins    ablation    Family History  Problem Relation Age of Onset  . Hypertension Mother   . Hyperlipidemia Father   . Asthma Father   . Hyperlipidemia Mother        controlled with lsi    Social History   Social History  . Marital status: Married    Spouse name: N/A  . Number of children: N/A  . Years  of education: N/A   Social History Main Topics  . Smoking status: Never Smoker  . Smokeless tobacco: Never Used  . Alcohol use No  . Drug use: No  . Sexual activity: Yes   Other Topics Concern  . None   Social History Narrative    graduated [employer].   Now married spring summer 15  Husband a Curatormechanic    2 hh  [employer]quen[employer] Sh[employer]Julian    Pet  2 dogs and cat exposure   ? 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    [facility] reconciliation [facility].     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 albuterol (PROVENTIL HFA;VENTOLIN HFA) 108 (90 Base) MCG/ACT inhaler Inhale 2 puffs into the lungs every 6 (six) hours as needed. 1 Inhaler 1  . amitriptyline (ELAVIL) 25 MG tablet Take 75 mg  by mouth at bedtime.    . carisoprodol (SOMA) 350 MG tablet     . etonogestrel-ethinyl estradiol (NUVARING) 0.12-0.015 MG/24HR vaginal ring NuvaRing 0.12 mg -0.015 mg/24 hr vaginal    . gabapentin (NEURONTIN) 800 MG tablet Take 800 mg by mouth 5 (five) times daily.    Marland Kitchen HYDROcodone-acetaminophen (NORCO) 10-325 MG tablet Take 1 tablet by mouth every 6 (six) hours as needed.    . montelukast (SINGULAIR) 10 MG tablet Take 1 tablet (10 mg total) by mouth at bedtime. 90 tablet 2  . NUVARING 0.12-0.015 MG/24HR vaginal ring     . ondansetron (ZOFRAN) 8 MG tablet Take 1 tablet as needed at the onset of nausea    . rizatriptan (MAXALT) 10 MG tablet Take 10 mg by mouth as needed for migraine. May repeat in 2 hours if needed    . amphetamine-dextroamphetamine (ADDERALL) 20 MG tablet Take 1 tablet (20 mg total) by mouth 2 (two) times daily. 60 tablet 0  . amphetamine-dextroamphetamine (ADDERALL) 20 MG tablet Take 1 tablet (20 mg total) by mouth 2 (two) times  daily. 60 tablet 0  . amphetamine-dextroamphetamine (ADDERALL) 20 MG tablet Take 1 tablet (20 mg total) by mouth 2 (two) times daily. 60 tablet 0   No facility-administered medications prior to visit.      EXAM:  BP 108/78 (BP Location: Right Arm, Patient Position: Sitting, Cuff Size: Normal)   Pulse 72   Temp 98.3 F (36.8 C) (Oral)   Wt 179 lb (81.2 kg)   BMI 27.62 kg/m   Body mass index is 27.62 kg/m.  GENERAL: vitals reviewed and listed above, alert, oriented, appears well hydrated and in no acute distress HEENT: atraumatic, conjunctiva  clear, no obvious abnormalities on inspection of external nose and ears OP : no lesion edema or exudate  NECK: no obvious masses on inspection palpation  LUNGS: clear to auscultation bilaterally, no wheezes, rales or rhonchi, good air movement CV: HRRR, no clubbing cyanosis or  peripheral edema nl cap refill  Abdomen soft without organomegaly guarding or rebound.  MS: moves all extremities without noticeable focal  abnormality PSYCH: pleasant and cooperative, no obvious depression or anxiety Lab Results  Component Value Date   WBC 7.7 10/25/2016   HGB 14.4 10/25/2016   HCT 42.4 10/25/2016   PLT 209.0 10/25/2016   GLUCOSE 86 08/18/2015   CHOL 242 (H) 12/11/2016   TRIG 78.0 12/11/2016   HDL 62.90 12/11/2016   LDLCALC 164 (H) 12/11/2016   ALT 14 08/18/2015   AST 14 08/18/2015   NA 139 08/18/2015   K 3.8 08/18/2015   CL 105 08/18/2015   CREATININE 0.84 08/18/2015   BUN 13 08/18/2015   CO2 26 08/18/2015   TSH 1.04 10/25/2016   BP Readings from Last 3 Encounters:  05/01/17 108/78  12/16/16 102/80  11/15/16 110/80   Reviewed past lab work with patient. ASSESSMENT AND PLAN:  Discussed the following assessment and plan:  Attention deficit hyperactivity disorder (ADHD), unspecified ADHD type - Continued medicine benefit more than risk. - Plan: Pain Mgmt, Profile 8 w/Conf, U  Medication management - Plan: Pain Mgmt, Profile 8  w/Conf, U  Hyperlipidemia, unspecified hyperlipidemia type Continue lifestyle intervention benefit more than risk of medicine Followed by neurology for headaches and neurologic symptoms.  Plan 85-month CPX we will plan labs at that time if not done elsewhere including lipid panel. -Patient advised to return or notify health care team  if  new concerns arise.  Patient Instructions  Continue lifestyle intervention healthy eating and exercise .  Medication refill.  Ok   PV cpx     Neta Mends. Madeliene Tejera M.D.

## 2017-05-01 ENCOUNTER — Encounter: Payer: Self-pay | Admitting: Internal Medicine

## 2017-05-01 ENCOUNTER — Ambulatory Visit (INDEPENDENT_AMBULATORY_CARE_PROVIDER_SITE_OTHER): Payer: Managed Care, Other (non HMO) | Admitting: Internal Medicine

## 2017-05-01 VITALS — BP 108/78 | HR 72 | Temp 98.3°F | Wt 179.0 lb

## 2017-05-01 DIAGNOSIS — F909 Attention-deficit hyperactivity disorder, unspecified type: Secondary | ICD-10-CM

## 2017-05-01 DIAGNOSIS — Z79899 Other long term (current) drug therapy: Secondary | ICD-10-CM | POA: Diagnosis not present

## 2017-05-01 DIAGNOSIS — E785 Hyperlipidemia, unspecified: Secondary | ICD-10-CM | POA: Diagnosis not present

## 2017-05-01 MED ORDER — AMPHETAMINE-DEXTROAMPHETAMINE 20 MG PO TABS: 20.0000 mg | ORAL_TABLET | Freq: Two times a day (BID) | ORAL | 0 refills | Status: DC

## 2017-05-01 NOTE — Patient Instructions (Signed)
Continue lifestyle intervention healthy eating and exercise .  Medication refill.  Ok   PV cpx

## 2017-08-01 ENCOUNTER — Other Ambulatory Visit: Payer: Self-pay | Admitting: Internal Medicine

## 2017-08-01 NOTE — Telephone Encounter (Signed)
CRM for notification. See Telephone encounter for:   08/01/17.   Relation to pt: self Call back number:[phone]   Reason for call:  Patient requesting 3 month supply amphetamine-dextroamphetamine (ADDERALL) 20 MG tablet, patient informed please allow 48 to 72 hour turn around, please advise

## 2017-08-04 NOTE — Telephone Encounter (Signed)
Last filled 05/01/2017, #60 x 3 rx's Last OV 05/01/2017  Please advise Dr Fabian SharpPanosh, thanks.

## 2017-08-05 MED ORDER — AMPHETAMINE-DEXTROAMPHETAMINE 20 MG PO TABS: 20.0000 mg | ORAL_TABLET | Freq: Two times a day (BID) | ORAL | 0 refills | Status: DC

## 2017-08-05 NOTE — Telephone Encounter (Signed)
Sent in electronically .  

## 2017-08-06 NOTE — Telephone Encounter (Signed)
I spoke with pt and she needs script sent [facility] [place] also request a call once this has been done.

## 2017-08-06 NOTE — Telephone Encounter (Signed)
Patient checking status, has been out of meds since Saturday. Please advise

## 2017-08-06 NOTE — Telephone Encounter (Signed)
Copied from CRM [id number]. Topic: Inquiry >> Aug 06, 2017  9:33 AM Raquel SarnaHayes, Teresa G wrote: Pt needing her Rx of Adderall.  She needs to pick up the written Rx at the office.  She has been waiting since Fri. 08-01-17.   Please call pt asap for hr to pick up.

## 2017-08-06 NOTE — Telephone Encounter (Signed)
Pt request printed prescription; see CRM [phone]

## 2017-08-07 ENCOUNTER — Other Ambulatory Visit: Payer: Self-pay | Admitting: Internal Medicine

## 2017-08-07 NOTE — Telephone Encounter (Signed)
[facility]eds [facility]  Please advise Dr Fabian SharpPanosh, thanks.   ** [facility]

## 2017-08-07 NOTE — Telephone Encounter (Signed)
Calling to check on status of adderral being sent to [facility].

## 2017-08-08 ENCOUNTER — Encounter: Payer: Self-pay | Admitting: Internal Medicine

## 2017-08-08 MED ORDER — AMPHETAMINE-DEXTROAMPHETAMINE 20 MG PO TABS: 20.0000 mg | ORAL_TABLET | Freq: Two times a day (BID) | ORAL | 0 refills | Status: DC

## 2017-08-08 NOTE — Telephone Encounter (Signed)
Sent in

## 2017-08-08 NOTE — Telephone Encounter (Signed)
Rx's were sent to wrong pharmacy. Pt would like this resent today please. Thanks!

## 2017-08-08 NOTE — Telephone Encounter (Signed)
This was sent different thread

## 2017-08-08 NOTE — Telephone Encounter (Signed)
Pt aware rx has been sent. Nothing further needed 

## 2017-08-08 NOTE — Telephone Encounter (Signed)
PT called for update on refill, she states she;s been trying to get this for a week.   PT is requesting a call when this is done/   [facility] [place] [address] [phone] (Phone) [phone] (Fax)

## 2017-08-22 ENCOUNTER — Telehealth: Payer: Self-pay | Admitting: Internal Medicine

## 2017-08-22 MED ORDER — PROMETHAZINE HCL 25 MG RE SUPP: 25.0000 mg | Freq: Four times a day (QID) | RECTAL | 0 refills | Status: DC | PRN

## 2017-08-22 NOTE — Telephone Encounter (Signed)
Called pt father, pt is had stomach flu and is vomiting a lot. Unable to keep anything down including fluids. Pt has tried po phenergan and was unable to keep it down. Symptoms started this morning very early. Does not feel there is any fever.  No diarrhea at this time.  [facility]ing [facility]  Please advise Dr Fabian SharpPanosh, thanks.

## 2017-08-22 NOTE — Telephone Encounter (Signed)
Sent in  Seek care if  persistent or progressive  Fever dehydration   Etc .

## 2017-08-22 NOTE — Telephone Encounter (Signed)
Pt aware. Nothing further needed 

## 2017-08-22 NOTE — Telephone Encounter (Signed)
Copied from CRM [phone] [phone]. Topic: Quick Communication - See Telephone Encounter >> Aug 22, 2017  1:52 PM Floria RavelingStovall, Shana A wrote: CRM for notification. See Telephone encounter for: pt father called in and stated pt has stomach flu and would like to know if dr could call something in for pt  Fathers number [phone] Pharmacy - [facility]  08/22/17.

## 2017-08-25 ENCOUNTER — Telehealth: Payer: Self-pay | Admitting: *Deleted

## 2017-08-25 NOTE — Telephone Encounter (Signed)
Prior auth for Dextroamp-Amphet 20mg  sent to [email or web address].

## 2017-10-27 NOTE — Progress Notes (Signed)
Chief Complaint  Patient presents with  . Annual Exam    med check for adderall?     HPI: Patient  Alexandra [name]  32 y.o. comes in today for Preventive Health Care visit  And med check   ADHD taking bid med   Almost all days . Still helpful for focusing nd denies sig se mood  Etc   Needs refill will runout next week when on vacation [facility]  and cant get filled early  Please print one month and  Others an be send in electronically    Up at 3 am    Early and afternoon    12- 2 .   All weekends  Focusing.       61.35 year old child.    Doing ok no depression .    Has seen rheum  for pos serology and  Ms tingling sx over the years      She may have her breast implants removed ? If could add tot he sx autoimmune risk ?    Exercise and diet to help lipids etc    Only use of hydro codone as a rescue   HA  And ocass back    On new med for migraine  Injectable  Neurontin  4 x per day and ocass soma  zofran for prn migraine     Allergy meds   Rare [place] soma , zofran pre migraine  Gabapentin taking 4 x per day but can increase to 5 x per neuro     Health Maintenance  Topic Date Due  . INFLUENZA VACCINE  01/29/2018  . PAP SMEAR  03/31/2018  . TETANUS/TDAP  11/30/2024  . HIV Screening  Completed   Health Maintenance Review LIFESTYLE:  Exercise:  5-6 days work out  [employer] and cardio.  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 GI /GU: No adominal pain, vomiting, change in bowel habits. No blood in the stool. No significant GU symptoms. SKIN/HEME: ,no acute skin rashes suspicious lesions or bleeding. No lymphadenopathy, nodules, masses.  NEURO/ PSYCH:  No  New neurologic signs has some  tingling  No depression anxiety. Problem now  IMM/ Allergy: No unusual infections.  Allergy .   REST of 12 system review negative except as per HPI   Past Medical History:  Diagnosis Date  . ADHD (attention deficit hyperactivity disorder)   . Adjustment disorder with anxiety 08/10/2013  . Allergic rhinitis, cause unspecified   . Anal fissure   . Anemia   . Atypical chest pain   . H/O headache following lumbar puncture    done to R/O MS  . Headache(784.0)   . History of Clostridium difficile infection    2007  . Hx of varicella   . IBS (irritable bowel syndrome)   . Indication for care in labor and delivery, antepartum 03/01/2015  . Interstitial cystitis   . Kidney stones    11 16   . Low back pain   . Mononeuritis of lower limb, unspecified   . Myalgia and myositis, unspecified   . NSVD (normal spontaneous vaginal delivery) 03/02/2015  . PVC (premature ventricular contraction)  had negative ECHO  . Screening examination for pulmonary tuberculosis   . Urinary tract infection, site not specified   . Vaginal bleeding in pregnancy   . Varicose veins    ablation    Past Surgical History:  Procedure Laterality Date  . BREAST SURGERY     augmentation  . ENDOVENOUS ABLATION SAPHENOUS VEIN W/ LASER      Family History  Problem Relation Age of Onset  . Hypertension Mother   . Hyperlipidemia Father   . Asthma Father   . Hyperlipidemia Mother        controlled with lsi    Social History   Socioeconomic History  . Marital status: Married    Spouse name: Not on file  . Number of children: Not on file  . Years of education: Not on file  . Highest education level: Not on file  Occupational History  . Not on file  Social Needs  . Financial resource strain: Not on file  . Food insecurity:    Worry: Not on file    Inability: Not on file  . Transportation needs:    Medical: Not on file    Non-medical: Not on file  Tobacco Use  . Smoking status: Never Smoker  .  Smokeless tobacco: Never Used  Substance and Sexual Activity  . Alcohol use: No    Alcohol/week: 0.0 oz  . Drug use: No  . Sexual activity: Yes  Lifestyle  . Physical activity:    Days per week: Not on file    Minutes per session: Not on file  . Stress: Not on file  Relationships  . Social connections:    Talks on phone: Not on file    Gets together: Not on file    Attends religious service: Not on file    Active member of club or organization: Not on file    Attends meetings of clubs or organizations: Not on file    Relationship status: Not on file  Other Topics Concern  . Not on file  Social History Narrative    graduated [employer].   Now married spring summer 15  Husband a Dealer    2 hh  [employer]    Pet  2 dogs and cat exposure   ? 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     Insurance at [facility] [employer] healthy baby 60 16 studying for coding certification fall 50   Coaches lacrosse  [employer]     Outpatient Medications Prior to Visit  Medication Sig Dispense Refill  . albuterol (PROVENTIL HFA;VENTOLIN HFA) 108 (90 Base) MCG/ACT inhaler Inhale 2 puffs into the lungs every 6 (six) hours as needed. 1 Inhaler 1  . amitriptyline (ELAVIL) 25 MG tablet Take 75 mg by mouth at bedtime.    . carisoprodol (SOMA) 350 MG tablet     . gabapentin (NEURONTIN) 800 MG tablet Take 800 mg by mouth 5 (five) times daily.    . Galcanezumab-gnlm (EMGALITY) 120 MG/ML SOAJ Inject 120 mg into the skin every 30 (thirty) days.    Marland Kitchen HYDROcodone-acetaminophen (NORCO) 10-325 MG tablet Take 1 tablet by mouth every 6 (six) hours as needed.    . montelukast (SINGULAIR) 10 MG tablet Take 1 tablet (10 mg total) by mouth at bedtime. 90 tablet  2  . NUVARING 0.12-0.015 MG/24HR vaginal ring     . ondansetron (ZOFRAN) 8 MG tablet Take 1 tablet as needed  at the onset of nausea    . rizatriptan (MAXALT) 10 MG tablet Take 10 mg by mouth as needed for migraine. May repeat in 2 hours if needed    . amphetamine-dextroamphetamine (ADDERALL) 20 MG tablet Take 1 tablet (20 mg total) by mouth 2 (two) times daily. 60 tablet 0  . amphetamine-dextroamphetamine (ADDERALL) 20 MG tablet Take 1 tablet (20 mg total) by mouth 2 (two) times daily. 60 tablet 0  . amphetamine-dextroamphetamine (ADDERALL) 20 MG tablet Take 1 tablet (20 mg total) by mouth 2 (two) times daily. 60 tablet 0  . etonogestrel-ethinyl estradiol (NUVARING) 0.12-0.015 MG/24HR vaginal ring NuvaRing 0.12 mg -0.015 mg/24 hr vaginal    . promethazine (PHENERGAN) 25 MG suppository Place 1 suppository (25 mg total) rectally every 6 (six) hours as needed for nausea or vomiting. (Patient not taking: Reported on 10/28/2017) 6 each 0   No facility-administered medications prior to visit.      EXAM:  BP 108/64 (BP Location: Left Arm, Patient Position: Sitting, Cuff Size: Normal)   Pulse 65   Temp 97.7 F (36.5 C) (Oral)   Ht _0  (1.727 m)   Wt 183 lb 9.6 oz (83.3 kg)   BMI 27.92 kg/m   Body mass index is 27.92 kg/m. Wt Readings from Last 3 Encounters:  10/28/17 183 lb 9.6 oz (83.3 kg)  05/01/17 179 lb (81.2 kg)  12/16/16 180 lb 3.2 oz (81.7 kg)     Physical Exam: Vital signs reviewed YEM:VVKP is a well-developed well-nourished alert cooperative    who appearsr stated age in no acute distress.  HEENT: normocephalic atraumatic , Eyes: PERRL EOM's full, conjunctiva clear, Nares: paten,t no deformity discharge or tenderness., Ears: no deformity EAC's clear TMs with normal landmarks. Mouth: clear OP, no lesions, edema.  Moist mucous membranes. Dentition in adequate repair. NECK: supple without masses, thyromegaly or bruits. CHEST/PULM:  Clear to auscultation and percussion breath sounds equal no wheeze , rales or rhonchi. No chest wall deformities or tenderness. CV: PMI is nondisplaced, S1  S2 no gallops, murmurs, rubs. Peripheral pulses are full without delay.No JVD .  ABDOMEN: Bowel sounds normal nontender  No guard or rebound, no hepato splenomegal no CVA tenderness.  No hernia. Extremtities:  No clubbing cyanosis or edema, no acute joint swelling or redness no focal atrophy NEURO:  Oriented x3, cranial nerves 3-12 appear to be intact, no obvious focal weakness,gait within normal limits no abnormal reflexes or asymmetrical SKIN: No acute rashes normal turgor, color, no bruising or petechiae. Facial erythema  No rash  PSYCH: Oriented, good eye contact, no obvious depression anxiety, cognition and judgment appear normal. LN: no cervical axillary inguinal adenopathy    BP Readings from Last 3 Encounters:  10/28/17 108/64  05/01/17 108/78  12/16/16 102/80      ASSESSMENT AND PLAN:  Discussed the following assessment and plan:  Visit for preventive health examination - Plan: Basic metabolic panel, CBC with Differential/Platelet, Hepatic function panel, Lipid panel, TSH  Medication management - Plan: Basic metabolic panel, CBC with Differential/Platelet, Hepatic function panel, Lipid panel, TSH  Attention deficit hyperactivity disorder (ADHD), unspecified ADHD type - Plan: Basic metabolic panel, CBC with Differential/Platelet, Hepatic function panel, Lipid panel, TSH  Hyperlipidemia, unspecified hyperlipidemia type - Plan: Basic metabolic panel, CBC with Differential/Platelet, Hepatic function panel, Lipid panel, TSH  Seasonal allergic rhinitis, unspecified trigger  Risk benefit of medication discussed. Continue meds   Sen in refill x 2  Printed x 1  Counseled. Continue lifestyle intervention healthy eating and exercise . And med check 6 months      Expectant management.  Patient Care Team: Catharina Pica, Standley Brooking, MD as PCP - Beckey Rutter, MD as Referring Physician (Neurology) [facility]  (Urology) Paula Compton, MD as Attending Physician (Obstetrics and  Gynecology) Patient Instructions  rov 6 months  Health Maintenance, Female Adopting a healthy lifestyle and getting preventive care can go a long way to promote health and wellness. Talk with your health care provider about what schedule of regular examinations is right for you. This is a good chance for you to check in with your provider about disease prevention and staying healthy. In between checkups, there are plenty of things you can do on your own. Experts have done a lot of research about which lifestyle changes and preventive measures are most likely to keep you healthy. Ask your health care provider for more information. Weight and diet Eat a healthy diet  Be sure to include plenty of vegetables, fruits, low-fat dairy products, and lean protein.  Do not eat a lot of foods high in solid fats, added sugars, or salt.  Get regular exercise. This is one of the most important things you can do for your health. ? Most adults should exercise for at least 150 minutes each week. The exercise should increase your heart rate and make you sweat (moderate-intensity exercise). ? Most adults should also do strengthening exercises at least twice a week. This is in addition to the moderate-intensity exercise.  Maintain a healthy weight  Body mass index (BMI) is a measurement that can be used to identify possible weight problems. It estimates body fat based on height and weight. Your health care provider can help determine your BMI and help you achieve or maintain a healthy weight.  For females 71 years of age and older: ? A BMI below 18.5 is considered underweight. ? A BMI of 18.5 to 24.9 is normal. ? A BMI of 25 to 29.9 is considered overweight. ? A BMI of 30 and above is considered obese.  Watch levels of cholesterol and blood lipids  You should start having your blood tested for lipids and cholesterol at 32 years of age, then have this test every 5 years.  You may need to have your  cholesterol levels checked more often if: ? Your lipid or cholesterol levels are high. ? You are older than 32 years of age. ? You are at high risk for heart disease.  Cancer screening Lung Cancer  Lung cancer screening is recommended for adults 24-42 years old who are at high risk for lung cancer because of a history of smoking.  A yearly low-dose CT scan of the lungs is recommended for people who: ? Currently smoke. ? Have quit within the past 15 years. ? Have at least a 30-pack-year history of smoking. A pack year is smoking an average of one pack of cigarettes a day for 1 year.  Yearly screening should continue until it has been 15 years since you quit.  Yearly screening should stop if you develop a health problem that would prevent you from having lung cancer treatment.  Breast Cancer  Practice breast self-awareness. This means understanding how your breasts normally appear and feel.  It also means doing regular breast self-exams. Let your health care provider know about any changes, no matter how  small.  If you are in your 20s or 30s, you should have a clinical breast exam (CBE) by a health care provider every 1-3 years as part of a regular health exam.  If you are 40 or older, have a CBE every year. Also consider having a breast X-ray (mammogram) every year.  If you have a family history of breast cancer, talk to your health care provider about genetic screening.  If you are at high risk for breast cancer, talk to your health care provider about having an MRI and a mammogram every year.  Breast cancer gene (BRCA) assessment is recommended for women who have family members with BRCA-related cancers. BRCA-related cancers include: ? Breast. ? Ovarian. ? Tubal. ? Peritoneal cancers.  Results of the assessment will determine the need for genetic counseling and BRCA1 and BRCA2 testing.  Cervical Cancer Your health care provider may recommend that you be screened regularly  for cancer of the pelvic organs (ovaries, uterus, and vagina). This screening involves a pelvic examination, including checking for microscopic changes to the surface of your cervix (Pap test). You may be encouraged to have this screening done every 3 years, beginning at age 3.  For women ages 90-65, health care providers may recommend pelvic exams and Pap testing every 3 years, or they may recommend the Pap and pelvic exam, combined with testing for human papilloma virus (HPV), every 5 years. Some types of HPV increase your risk of cervical cancer. Testing for HPV may also be done on women of any age with unclear Pap test results.  Other health care providers may not recommend any screening for nonpregnant women who are considered low risk for pelvic cancer and who do not have symptoms. Ask your health care provider if a screening pelvic exam is right for you.  If you have had past treatment for cervical cancer or a condition that could lead to cancer, you need Pap tests and screening for cancer for at least 20 years after your treatment. If Pap tests have been discontinued, your risk factors (such as having a new sexual partner) need to be reassessed to determine if screening should resume. Some women have medical problems that increase the chance of getting cervical cancer. In these cases, your health care provider may recommend more frequent screening and Pap tests.  Colorectal Cancer  This type of cancer can be detected and often prevented.  Routine colorectal cancer screening usually begins at 32 years of age and continues through 32 years of age.  Your health care provider may recommend screening at an earlier age if you have risk factors for colon cancer.  Your health care provider may also recommend using home test kits to check for hidden blood in the stool.  A small camera at the end of a tube can be used to examine your colon directly (sigmoidoscopy or colonoscopy). This is done to  check for the earliest forms of colorectal cancer.  Routine screening usually begins at age 11.  Direct examination of the colon should be repeated every 5-10 years through 32 years of age. However, you may need to be screened more often if early forms of precancerous polyps or small growths are found.  Skin Cancer  Check your skin from head to toe regularly.  Tell your health care provider about any new moles or changes in moles, especially if there is a change in a mole's shape or color.  Also tell your health care provider if you have a mole  that is larger than the size of a pencil eraser.  Always use sunscreen. Apply sunscreen liberally and repeatedly throughout the day.  Protect yourself by wearing long sleeves, pants, a wide-brimmed hat, and sunglasses whenever you are outside.  Heart disease, diabetes, and high blood pressure  High blood pressure causes heart disease and increases the risk of stroke. High blood pressure is more likely to develop in: ? People who have blood pressure in the high end of the normal range (130-139/85-89 mm Hg). ? People who are overweight or obese. ? People who are African American.  If you are 43-58 years of age, have your blood pressure checked every 3-5 years. If you are 72 years of age or older, have your blood pressure checked every year. You should have your blood pressure measured twice-once when you are at a hospital or clinic, and once when you are not at a hospital or clinic. Record the average of the two measurements. To check your blood pressure when you are not at a hospital or clinic, you can use: ? An automated blood pressure machine at a pharmacy. ? A home blood pressure monitor.  If you are between 62 years and 38 years old, ask your health care provider if you should take aspirin to prevent strokes.  Have regular diabetes screenings. This involves taking a blood sample to check your fasting blood sugar level. ? If you are at a  normal weight and have a low risk for diabetes, have this test once every three years after 32 years of age. ? If you are overweight and have a high risk for diabetes, consider being tested at a younger age or more often. Preventing infection Hepatitis B  If you have a higher risk for hepatitis B, you should be screened for this virus. You are considered at high risk for hepatitis B if: ? You were born in a country where hepatitis B is common. Ask your health care provider which countries are considered high risk. ? Your parents were born in a high-risk country, and you have not been immunized against hepatitis B (hepatitis B vaccine). ? You have HIV or AIDS. ? You use needles to inject street drugs. ? You live with someone who has hepatitis B. ? You have had sex with someone who has hepatitis B. ? You get hemodialysis treatment. ? You take certain medicines for conditions, including cancer, organ transplantation, and autoimmune conditions.  Hepatitis C  Blood testing is recommended for: ? Everyone born from [birth year] through 1965. ? Anyone with known risk factors for hepatitis C.  Sexually transmitted infections (STIs)  You should be screened for sexually transmitted infections (STIs) including gonorrhea and chlamydia if: ? You are sexually active and are younger than 32 years of age. ? You are older than 32 years of age and your health care provider tells you that you are at risk for this type of infection. ? Your sexual activity has changed since you were last screened and you are at an increased risk for chlamydia or gonorrhea. Ask your health care provider if you are at risk.  If you do not have HIV, but are at risk, it may be recommended that you take a prescription medicine daily to prevent HIV infection. This is called pre-exposure prophylaxis (PrEP). You are considered at risk if: ? You are sexually active and do not regularly use condoms or know the HIV status of your  partner(s). ? You take drugs by injection. ? You are  sexually active with a partner who has HIV.  Talk with your health care provider about whether you are at high risk of being infected with HIV. If you choose to begin PrEP, you should first be tested for HIV. You should then be tested every 3 months for as long as you are taking PrEP. Pregnancy  If you are premenopausal and you may become pregnant, ask your health care provider about preconception counseling.  If you may become pregnant, take 400 to 800 micrograms (mcg) of folic acid every day.  If you want to prevent pregnancy, talk to your health care provider about birth control (contraception). Osteoporosis and menopause  Osteoporosis is a disease in which the bones lose minerals and strength with aging. This can result in serious bone fractures. Your risk for osteoporosis can be identified using a bone density scan.  If you are 35 years of age or older, or if you are at risk for osteoporosis and fractures, ask your health care provider if you should be screened.  Ask your health care provider whether you should take a calcium or vitamin D supplement to lower your risk for osteoporosis.  Menopause may have certain physical symptoms and risks.  Hormone replacement therapy may reduce some of these symptoms and risks. Talk to your health care provider about whether hormone replacement therapy is right for you. Follow these instructions at home:  Schedule regular health, dental, and eye exams.  Stay current with your immunizations.  Do not use any tobacco products including cigarettes, chewing tobacco, or electronic cigarettes.  If you are pregnant, do not drink alcohol.  If you are breastfeeding, limit how much and how often you drink alcohol.  Limit alcohol intake to no more than 1 drink per day for nonpregnant women. One drink equals 12 ounces of beer, 5 ounces of wine, or 1 ounces of hard liquor.  Do not use street  drugs.  Do not share needles.  Ask your health care provider for help if you need support or information about quitting drugs.  Tell your health care provider if you often feel depressed.  Tell your health care provider if you have ever been abused or do not feel safe at home. This information is not intended to replace advice given to you by your health care provider. Make sure you discuss any questions you have with your health care provider. Document Released: 12/31/2010 Document Revised: 11/23/2015 Document Reviewed: 03/21/2015 Elsevier Interactive Patient Education  2018 [place]. Jaiah Weigel M.D.

## 2017-10-28 ENCOUNTER — Ambulatory Visit (INDEPENDENT_AMBULATORY_CARE_PROVIDER_SITE_OTHER): Payer: PRIVATE HEALTH INSURANCE | Admitting: Internal Medicine

## 2017-10-28 ENCOUNTER — Encounter: Payer: Self-pay | Admitting: Internal Medicine

## 2017-10-28 VITALS — BP 108/64 | HR 65 | Temp 97.7°F | Ht 68.0 in | Wt 183.6 lb

## 2017-10-28 DIAGNOSIS — Z0000 Encounter for general adult medical examination without abnormal findings: Secondary | ICD-10-CM | POA: Diagnosis not present

## 2017-10-28 DIAGNOSIS — Z79899 Other long term (current) drug therapy: Secondary | ICD-10-CM | POA: Diagnosis not present

## 2017-10-28 DIAGNOSIS — J302 Other seasonal allergic rhinitis: Secondary | ICD-10-CM

## 2017-10-28 DIAGNOSIS — E785 Hyperlipidemia, unspecified: Secondary | ICD-10-CM | POA: Diagnosis not present

## 2017-10-28 DIAGNOSIS — F909 Attention-deficit hyperactivity disorder, unspecified type: Secondary | ICD-10-CM | POA: Diagnosis not present

## 2017-10-28 LAB — LIPID PANEL
CHOL/HDL RATIO: 3
Cholesterol: 256 mg/dL — ABNORMAL HIGH (ref 0–200)
HDL: 76.5 mg/dL (ref >39.00)
LDL CALC: 162 mg/dL — AB (ref 0–99)
NonHDL: 179.89
Triglycerides: 87 mg/dL (ref 0.0–149.0)
VLDL: 17.4 mg/dL (ref 0.0–40.0)

## 2017-10-28 LAB — CBC WITH DIFFERENTIAL/PLATELET
BASOS ABS: 0 10*3/uL (ref 0.0–0.1)
Basophils Relative: 0.5 % (ref 0.0–3.0)
EOS PCT: 1.3 % (ref 0.0–5.0)
Eosinophils Absolute: 0.1 10*3/uL (ref 0.0–0.7)
HCT: 47 % — ABNORMAL HIGH (ref 36.0–46.0)
HEMOGLOBIN: 16 g/dL — AB (ref 12.0–15.0)
Lymphocytes Relative: 29.1 % (ref 12.0–46.0)
Lymphs Abs: 2.2 10*3/uL (ref 0.7–4.0)
MCHC: 34 g/dL (ref 30.0–36.0)
MCV: 90.1 fl (ref 78.0–100.0)
MONO ABS: 0.7 10*3/uL (ref 0.1–1.0)
Monocytes Relative: 9.6 % (ref 3.0–12.0)
Neutro Abs: 4.5 10*3/uL (ref 1.4–7.7)
Neutrophils Relative %: 59.5 % (ref 43.0–77.0)
Platelets: 206 10*3/uL (ref 150.0–400.0)
RBC: 5.21 Mil/uL — AB (ref 3.87–5.11)
RDW: 13.1 % (ref 11.5–15.5)
WBC: 7.5 10*3/uL (ref 4.0–10.5)

## 2017-10-28 LAB — BASIC METABOLIC PANEL
BUN: 11 mg/dL (ref 6–23)
CALCIUM: 9.3 mg/dL (ref 8.4–10.5)
CO2: 27 mEq/L (ref 19–32)
CREATININE: 0.75 mg/dL (ref 0.40–1.20)
Chloride: 103 mEq/L (ref 96–112)
GFR: 95.05 mL/min (ref >60.00)
Glucose, Bld: 80 mg/dL (ref 70–99)
POTASSIUM: 3.9 meq/L (ref 3.5–5.1)
Sodium: 139 mEq/L (ref 135–145)

## 2017-10-28 LAB — TSH: TSH: 0.73 u[IU]/mL (ref 0.35–4.50)

## 2017-10-28 LAB — HEPATIC FUNCTION PANEL
ALK PHOS: 79 U/L (ref 39–117)
ALT: 16 U/L (ref 0–35)
AST: 16 U/L (ref 0–37)
Albumin: 4.2 g/dL (ref 3.5–5.2)
BILIRUBIN DIRECT: 0.1 mg/dL (ref 0.0–0.3)
BILIRUBIN TOTAL: 0.5 mg/dL (ref 0.2–1.2)
Total Protein: 7.2 g/dL (ref 6.0–8.3)

## 2017-10-28 MED ORDER — AMPHETAMINE-DEXTROAMPHETAMINE 20 MG PO TABS: 20.0000 mg | ORAL_TABLET | Freq: Two times a day (BID) | ORAL | 0 refills | Status: DC

## 2017-10-28 NOTE — Patient Instructions (Signed)
rov 6 months  Health Maintenance, Female Adopting a healthy lifestyle and getting preventive care can go a long way to promote health and wellness. Talk with your health care provider about what schedule of regular examinations is right for you. This is a good chance for you to check in with your provider about disease prevention and staying healthy. In between checkups, there are plenty of things you can do on your own. Experts have done a lot of research about which lifestyle changes and preventive measures are most likely to keep you healthy. Ask your health care provider for more information. Weight and diet Eat a healthy diet  Be sure to include plenty of vegetables, fruits, low-fat dairy products, and lean protein.  Do not eat a lot of foods high in solid fats, added sugars, or salt.  Get regular exercise. This is one of the most important things you can do for your health. ? Most adults should exercise for at least 150 minutes each week. The exercise should increase your heart rate and make you sweat (moderate-intensity exercise). ? Most adults should also do strengthening exercises at least twice a week. This is in addition to the moderate-intensity exercise.  Maintain a healthy weight  Body mass index (BMI) is a measurement that can be used to identify possible weight problems. It estimates body fat based on height and weight. Your health care provider can help determine your BMI and help you achieve or maintain a healthy weight.  For females 28 years of age and older: ? A BMI below 18.5 is considered underweight. ? A BMI of 18.5 to 24.9 is normal. ? A BMI of 25 to 29.9 is considered overweight. ? A BMI of 30 and above is considered obese.  Watch levels of cholesterol and blood lipids  You should start having your blood tested for lipids and cholesterol at 32 years of age, then have this test every 5 years.  You may need to have your cholesterol levels checked more often  if: ? Your lipid or cholesterol levels are high. ? You are older than 32 years of age. ? You are at high risk for heart disease.  Cancer screening Lung Cancer  Lung cancer screening is recommended for adults 25-52 years old who are at high risk for lung cancer because of a history of smoking.  A yearly low-dose CT scan of the lungs is recommended for people who: ? Currently smoke. ? Have quit within the past 15 years. ? Have at least a 30-pack-year history of smoking. A pack year is smoking an average of one pack of cigarettes a day for 1 year.  Yearly screening should continue until it has been 15 years since you quit.  Yearly screening should stop if you develop a health problem that would prevent you from having lung cancer treatment.  Breast Cancer  Practice breast self-awareness. This means understanding how your breasts normally appear and feel.  It also means doing regular breast self-exams. Let your health care provider know about any changes, no matter how small.  If you are in your 20s or 30s, you should have a clinical breast exam (CBE) by a health care provider every 1-3 years as part of a regular health exam.  If you are 49 or older, have a CBE every year. Also consider having a breast X-ray (mammogram) every year.  If you have a family history of breast cancer, talk to your health care provider about genetic screening.  If you  are at high risk for breast cancer, talk to your health care provider about having an MRI and a mammogram every year.  Breast cancer gene (BRCA) assessment is recommended for women who have family members with BRCA-related cancers. BRCA-related cancers include: ? Breast. ? Ovarian. ? Tubal. ? Peritoneal cancers.  Results of the assessment will determine the need for genetic counseling and BRCA1 and BRCA2 testing.  Cervical Cancer Your health care provider may recommend that you be screened regularly for cancer of the pelvic organs  (ovaries, uterus, and vagina). This screening involves a pelvic examination, including checking for microscopic changes to the surface of your cervix (Pap test). You may be encouraged to have this screening done every 3 years, beginning at age 72.  For women ages 67-65, health care providers may recommend pelvic exams and Pap testing every 3 years, or they may recommend the Pap and pelvic exam, combined with testing for human papilloma virus (HPV), every 5 years. Some types of HPV increase your risk of cervical cancer. Testing for HPV may also be done on women of any age with unclear Pap test results.  Other health care providers may not recommend any screening for nonpregnant women who are considered low risk for pelvic cancer and who do not have symptoms. Ask your health care provider if a screening pelvic exam is right for you.  If you have had past treatment for cervical cancer or a condition that could lead to cancer, you need Pap tests and screening for cancer for at least 20 years after your treatment. If Pap tests have been discontinued, your risk factors (such as having a new sexual partner) need to be reassessed to determine if screening should resume. Some women have medical problems that increase the chance of getting cervical cancer. In these cases, your health care provider may recommend more frequent screening and Pap tests.  Colorectal Cancer  This type of cancer can be detected and often prevented.  Routine colorectal cancer screening usually begins at 32 years of age and continues through 32 years of age.  Your health care provider may recommend screening at an earlier age if you have risk factors for colon cancer.  Your health care provider may also recommend using home test kits to check for hidden blood in the stool.  A small camera at the end of a tube can be used to examine your colon directly (sigmoidoscopy or colonoscopy). This is done to check for the earliest forms of  colorectal cancer.  Routine screening usually begins at age 74.  Direct examination of the colon should be repeated every 5-10 years through 32 years of age. However, you may need to be screened more often if early forms of precancerous polyps or small growths are found.  Skin Cancer  Check your skin from head to toe regularly.  Tell your health care provider about any new moles or changes in moles, especially if there is a change in a mole's shape or color.  Also tell your health care provider if you have a mole that is larger than the size of a pencil eraser.  Always use sunscreen. Apply sunscreen liberally and repeatedly throughout the day.  Protect yourself by wearing long sleeves, pants, a wide-brimmed hat, and sunglasses whenever you are outside.  Heart disease, diabetes, and high blood pressure  High blood pressure causes heart disease and increases the risk of stroke. High blood pressure is more likely to develop in: ? People who have blood pressure in  the high end of the normal range (130-139/85-89 mm Hg). ? People who are overweight or obese. ? People who are African American.  If you are 18-39 years of age, have your blood pressure checked every 3-5 years. If you are 40 years of age or older, have your blood pressure checked every year. You should have your blood pressure measured twice-once when you are at a hospital or clinic, and once when you are not at a hospital or clinic. Record the average of the two measurements. To check your blood pressure when you are not at a hospital or clinic, you can use: ? An automated blood pressure machine at a pharmacy. ? A home blood pressure monitor.  If you are between 55 years and 79 years old, ask your health care provider if you should take aspirin to prevent strokes.  Have regular diabetes screenings. This involves taking a blood sample to check your fasting blood sugar level. ? If you are at a normal weight and have a low risk  for diabetes, have this test once every three years after 32 years of age. ? If you are overweight and have a high risk for diabetes, consider being tested at a younger age or more often. Preventing infection Hepatitis B  If you have a higher risk for hepatitis B, you should be screened for this virus. You are considered at high risk for hepatitis B if: ? You were born in a country where hepatitis B is common. Ask your health care provider which countries are considered high risk. ? Your parents were born in a high-risk country, and you have not been immunized against hepatitis B (hepatitis B vaccine). ? You have HIV or AIDS. ? You use needles to inject street drugs. ? You live with someone who has hepatitis B. ? You have had sex with someone who has hepatitis B. ? You get hemodialysis treatment. ? You take certain medicines for conditions, including cancer, organ transplantation, and autoimmune conditions.  Hepatitis C  Blood testing is recommended for: ? Everyone born from 1945 through 1965. ? Anyone with known risk factors for hepatitis C.  Sexually transmitted infections (STIs)  You should be screened for sexually transmitted infections (STIs) including gonorrhea and chlamydia if: ? You are sexually active and are younger than 32 years of age. ? You are older than 32 years of age and your health care provider tells you that you are at risk for this type of infection. ? Your sexual activity has changed since you were last screened and you are at an increased risk for chlamydia or gonorrhea. Ask your health care provider if you are at risk.  If you do not have HIV, but are at risk, it may be recommended that you take a prescription medicine daily to prevent HIV infection. This is called pre-exposure prophylaxis (PrEP). You are considered at risk if: ? You are sexually active and do not regularly use condoms or know the HIV status of your partner(s). ? You take drugs by  injection. ? You are sexually active with a partner who has HIV.  Talk with your health care provider about whether you are at high risk of being infected with HIV. If you choose to begin PrEP, you should first be tested for HIV. You should then be tested every 3 months for as long as you are taking PrEP. Pregnancy  If you are premenopausal and you may become pregnant, ask your health care provider about preconception counseling.    If you may become pregnant, take 400 to 800 micrograms (mcg) of folic acid every day.  If you want to prevent pregnancy, talk to your health care provider about birth control (contraception). Osteoporosis and menopause  Osteoporosis is a disease in which the bones lose minerals and strength with aging. This can result in serious bone fractures. Your risk for osteoporosis can be identified using a bone density scan.  If you are 17 years of age or older, or if you are at risk for osteoporosis and fractures, ask your health care provider if you should be screened.  Ask your health care provider whether you should take a calcium or vitamin D supplement to lower your risk for osteoporosis.  Menopause may have certain physical symptoms and risks.  Hormone replacement therapy may reduce some of these symptoms and risks. Talk to your health care provider about whether hormone replacement therapy is right for you. Follow these instructions at home:  Schedule regular health, dental, and eye exams.  Stay current with your immunizations.  Do not use any tobacco products including cigarettes, chewing tobacco, or electronic cigarettes.  If you are pregnant, do not drink alcohol.  If you are breastfeeding, limit how much and how often you drink alcohol.  Limit alcohol intake to no more than 1 drink per day for nonpregnant women. One drink equals 12 ounces of beer, 5 ounces of wine, or 1 ounces of hard liquor.  Do not use street drugs.  Do not share needles.  Ask  your health care provider for help if you need support or information about quitting drugs.  Tell your health care provider if you often feel depressed.  Tell your health care provider if you have ever been abused or do not feel safe at home. This information is not intended to replace advice given to you by your health care provider. Make sure you discuss any questions you have with your health care provider. Document Released: 12/31/2010 Document Revised: 11/23/2015 Document Reviewed: 03/21/2015 Elsevier Interactive Patient Education  [employer].

## 2018-02-03 ENCOUNTER — Other Ambulatory Visit: Payer: Self-pay | Admitting: Internal Medicine

## 2018-02-03 NOTE — Telephone Encounter (Signed)
Last filled 10/28/17 #60 x 3 rx's at last OV  Please advise Dr Fabian SharpPanosh, thanks.

## 2018-02-04 MED ORDER — AMPHETAMINE-DEXTROAMPHETAMINE 20 MG PO TABS: 20.0000 mg | ORAL_TABLET | Freq: Two times a day (BID) | ORAL | 0 refills | Status: DC

## 2018-02-04 NOTE — Telephone Encounter (Signed)
Sent in electronically .  

## 2018-04-01 ENCOUNTER — Other Ambulatory Visit: Payer: Self-pay | Admitting: Specialist

## 2018-04-01 DIAGNOSIS — M5412 Radiculopathy, cervical region: Secondary | ICD-10-CM

## 2018-04-29 ENCOUNTER — Ambulatory Visit: Payer: PRIVATE HEALTH INSURANCE | Admitting: Internal Medicine

## 2018-04-30 NOTE — Progress Notes (Signed)
Chief Complaint  Patient presents with  . Medication Management    Adderall - good tolerance, no current issues.     HPI: Alexandra [name] 32 y.o. come in for Chronic disease management   ADHD  Medication  : still helps  Stay  focused  Works on sundays    Not taking when not working .    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  Sleep : 5-7  Hours.  hh of 3  tobacco   Etoh.  No . Rd  No.    Taking  fioricet  For neck pain at bedtime. qhs  And     And hydro if needed.  Nota s much any more  Since on galcanesxumab  ( neck and migraines)  ROS: See pertinent positives and negatives per HPI. Denies sig mood issues  No cp sob  Inability to exercise    Past Medical History:  Diagnosis Date  . ADHD (attention deficit hyperactivity disorder)   . Adjustment disorder with anxiety 08/10/2013  . Allergic rhinitis, cause unspecified   . Anal fissure   . Anemia   . Atypical chest pain   . H/O headache following lumbar puncture    done to R/O MS  . Headache(784.0)   . History of Clostridium difficile infection    2007  . Hx of varicella   . IBS (irritable bowel syndrome)   . Indication for care in labor and delivery, antepartum 03/01/2015  . Interstitial cystitis   . Kidney stones    11 16   . Low back pain   . Mononeuritis of lower limb, unspecified   . Myalgia and myositis, unspecified   . NSVD (normal spontaneous vaginal delivery) 03/02/2015  . PVC (premature ventricular contraction)    had negative ECHO  . Screening examination for pulmonary tuberculosis   . Urinary tract infection, site not specified   . Vaginal bleeding in pregnancy   . Varicose veins    ablation    Family History  Problem Relation Age of Onset  . Hypertension Mother   .  Hyperlipidemia Father   . Asthma Father   . Hyperlipidemia Mother        controlled with lsi    Social History   Socioeconomic History  . Marital status: Married    Spouse name: Not on file  . Number of children: Not on file  . Years of education: Not on file  . Highest education level: Not on file  Occupational History  . Not on file  Social Needs  . Financial resource strain: Not on file  . Food insecurity:    Worry: Not on file    Inability: Not on file  . Transportation needs:    Medical: Not on file    Non-medical: Not on file  Tobacco Use  . Smoking status: Never Smoker  . Smokeless tobacco: Never Used  Substance and Sexual Activity  . Alcohol use: No    Alcohol/week: 0.0 standard drinks  . Drug use: No  . Sexual activity: Yes  Lifestyle  . Physical activity:    Days per week: Not on file    Minutes per session:  Not on file  . Stress: Not on file  Relationships  . Social connections:    Talks on phone: Not on file    Gets together: Not on file    Attends religious service: Not on file    Active member of club or organization: Not on file    Attends meetings of clubs or organizations: Not on file    Relationship status: Not on file  Other Topics Concern  . Not on file  Social History Narrative    graduated [employer].   Now married spring summer 15  Husband a Curator    2 hh  [name]    Pet  2 dogs and cat exposure   ? 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    med reconciliation at [facility].     Insurance at [employer] healthy baby 8 16 studying for coding certification fall 16   Coaches lacrosse  [employer]    Allergies as of 05/01/2018      Reactions   Elmiron [pentosan Polysulfate Sodium] Swelling   Lexapro [escitalopram Oxalate] Other (See Comments)   Aggravated migraines  2015   Septra [bactrim] Rash   Sulfa  Antibiotics Rash      Medication List        Accurate as of 05/01/18 12:32 PM. Always use your most recent med list.          albuterol 108 (90 Base) MCG/ACT inhaler Commonly known as:  PROVENTIL HFA;VENTOLIN HFA Inhale 2 puffs into the lungs every 6 (six) hours as needed.   amphetamine-dextroamphetamine 20 MG tablet Commonly known as:  ADDERALL Take 1 tablet (20 mg total) by mouth 2 (two) times daily.   amphetamine-dextroamphetamine 20 MG tablet Commonly known as:  ADDERALL Take 1 tablet (20 mg total) by mouth 2 (two) times daily.   amphetamine-dextroamphetamine 20 MG tablet Commonly known as:  ADDERALL Take 1 tablet (20 mg total) by mouth 2 (two) times daily.   carisoprodol 350 MG tablet Commonly known as:  SOMA   EMGALITY 120 MG/ML Soaj Generic drug:  Galcanezumab-gnlm Inject 120 mg into the skin every 30 (thirty) days.   FIORICET 50-300-40 MG Caps Generic drug:  Butalbital-APAP-Caffeine Take 1 capsule by mouth at bedtime.   gabapentin 800 MG tablet Commonly known as:  NEURONTIN Take 800 mg by mouth 5 (five) times daily.   HYDROcodone-acetaminophen 10-325 MG tablet Commonly known as:  NORCO Take 1 tablet by mouth every 6 (six) hours as needed.   MAXALT 10 MG tablet Generic drug:  rizatriptan Take 10 mg by mouth as needed for migraine. May repeat in 2 hours if needed   montelukast 10 MG tablet Commonly known as:  SINGULAIR Take 1 tablet (10 mg total) by mouth at bedtime.   NUVARING 0.12-0.015 MG/24HR vaginal ring Generic drug:  etonogestrel-ethinyl estradiol   ondansetron 8 MG tablet Commonly known as:  ZOFRAN Take 1 tablet as needed at the onset of nausea       Outpatient Medications Prior to Visit  Medication Sig Dispense Refill  . albuterol (PROVENTIL HFA;VENTOLIN HFA) 108 (90 Base) MCG/ACT inhaler Inhale 2 puffs into the lungs every 6 (six) hours as needed. 1 Inhaler 1  . Butalbital-APAP-Caffeine (FIORICET) 50-300-40 MG CAPS Take 1 capsule by  mouth at bedtime.    . carisoprodol (SOMA) 350 MG  tablet     . gabapentin (NEURONTIN) 800 MG tablet Take 800 mg by mouth 5 (five) times daily.    . Galcanezumab-gnlm (EMGALITY) 120 MG/ML SOAJ Inject 120 mg into the skin every 30 (thirty) days.    Marland Kitchen HYDROcodone-acetaminophen (NORCO) 10-325 MG tablet Take 1 tablet by mouth every 6 (six) hours as needed.    . montelukast (SINGULAIR) 10 MG tablet Take 1 tablet (10 mg total) by mouth at bedtime. 90 tablet 2  . NUVARING 0.12-0.015 MG/24HR vaginal ring     . ondansetron (ZOFRAN) 8 MG tablet Take 1 tablet as needed at the onset of nausea    . rizatriptan (MAXALT) 10 MG tablet Take 10 mg by mouth as needed for migraine. May repeat in 2 hours if needed    . amphetamine-dextroamphetamine (ADDERALL) 20 MG tablet Take 1 tablet (20 mg total) by mouth 2 (two) times daily. 60 tablet 0  . amphetamine-dextroamphetamine (ADDERALL) 20 MG tablet Take 1 tablet (20 mg total) by mouth 2 (two) times daily. 60 tablet 0  . amphetamine-dextroamphetamine (ADDERALL) 20 MG tablet Take 1 tablet (20 mg total) by mouth 2 (two) times daily. 60 tablet 0  . amitriptyline (ELAVIL) 25 MG tablet Take 75 mg by mouth at bedtime.     No facility-administered medications prior to visit.      EXAM:  BP 122/82 (BP Location: Right Arm, Patient Position: Sitting, Cuff Size: Normal)   Pulse 98   Temp 97.9 F (36.6 C) (Oral)   Wt 189 lb 3.2 oz (85.8 kg)   BMI 28.77 kg/m   Body mass index is 28.77 kg/m.  GENERAL: vitals reviewed and listed above, alert, oriented, appears well hydrated and in no acute distress HEENT: atraumatic, conjunctiva  clear, no obvious abnormalities on inspection of external nose and ears  NECK: no obvious masses on inspection palpation  LUNGS: clear to auscultation bilaterally, no wheezes, rales or rhonchi, good air movement CV: HRRR, no clubbing cyanosis or  peripheral edema nl cap refill  Abdomen:  Sof,t normal bowel sounds without hepatosplenomegaly, no  guarding rebound or masses no CVA tenderness MS: moves all extremities without noticeable focal  abnormality PSYCH: pleasant and cooperative, no obvious depression or anxiety Lab Results  Component Value Date   WBC 7.5 10/28/2017   HGB 16.0 (H) 10/28/2017   HCT 47.0 (H) 10/28/2017   PLT 206.0 10/28/2017   GLUCOSE 80 10/28/2017   CHOL 256 (H) 10/28/2017   TRIG 87.0 10/28/2017   HDL 76.50 10/28/2017   LDLCALC 162 (H) 10/28/2017   ALT 16 10/28/2017   AST 16 10/28/2017   NA 139 10/28/2017   K 3.9 10/28/2017   CL 103 10/28/2017   CREATININE 0.75 10/28/2017   BUN 11 10/28/2017   CO2 27 10/28/2017   TSH 0.73 10/28/2017   BP Readings from Last 3 Encounters:  05/01/18 122/82  10/28/17 108/64  05/01/17 108/78    ASSESSMENT AND PLAN:  Discussed the following assessment and plan:  Attention deficit hyperactivity disorder (ADHD), unspecified ADHD type  Medication management  Hyperlipidemia, unspecified hyperlipidemia type Benefit more than risk of medications  to continue.   Work on getting more sleep  Continue lifestyle intervention healthy eating and exercise . For the h of very high  lipids ( orig in 300 range)    Can get at [facility]px in April may    next check and labs then   Risk benefit of medications discussed. Over all seems top be using less medication for pain  managmed by neuro .  She is on a goo bit of medication   Still on  Many at risk meds .   -Patient advised to return or notify health care team  if  new concerns arise.  Patient Instructions  Continue lifestyle intervention healthy eating and exercise .  Med sendt in.  Plan  CPX with fasting labs  At the visit  Or if needed  To check lipids etc .      Neta Mends. Evone Arseneau M.D.

## 2018-05-01 ENCOUNTER — Encounter: Payer: Self-pay | Admitting: Internal Medicine

## 2018-05-01 ENCOUNTER — Ambulatory Visit: Payer: PRIVATE HEALTH INSURANCE | Admitting: Internal Medicine

## 2018-05-01 VITALS — BP 122/82 | HR 98 | Temp 97.9°F | Wt 189.2 lb

## 2018-05-01 DIAGNOSIS — E785 Hyperlipidemia, unspecified: Secondary | ICD-10-CM

## 2018-05-01 DIAGNOSIS — F909 Attention-deficit hyperactivity disorder, unspecified type: Secondary | ICD-10-CM | POA: Diagnosis not present

## 2018-05-01 DIAGNOSIS — Z79899 Other long term (current) drug therapy: Secondary | ICD-10-CM | POA: Diagnosis not present

## 2018-05-01 MED ORDER — AMPHETAMINE-DEXTROAMPHETAMINE 20 MG PO TABS: 20.0000 mg | ORAL_TABLET | Freq: Two times a day (BID) | ORAL | 0 refills | Status: AC

## 2018-05-01 MED ORDER — AMPHETAMINE-DEXTROAMPHETAMINE 20 MG PO TABS: 20.0000 mg | ORAL_TABLET | Freq: Two times a day (BID) | ORAL | 0 refills | Status: DC

## 2018-05-01 NOTE — Patient Instructions (Addendum)
Continue lifestyle intervention healthy eating and exercise .  Med sendt in.  Plan  CPX with fasting labs  At the visit  Or if needed  To check lipids etc .

## 2018-07-01 NOTE — L&D Delivery Note (Signed)
DELIVERY NOTE  Pt complete and at +2 station with urge to push. Epidural controlling pain. Pt pushed and delivered a viable female infant in ROA position. Anterior and posterior shoulders spontaneously delivered with next two pushes; body easily followed next. Infant placed on mothers abdomen and bulb suction of mouth and nose performed. Cord was then clamped and cut by FOB. Cord blood obtained, 3VC. Baby had a vigorous spontaneous cry noted. Placenta then delivered at 2107 intact. Fundal massage performed and pitocin per protocol. Fundus firm. The following lacerations were noted: BL periurethral. Repaired in routine fashion with 3-0 monocryl figure of eights Mother and baby stable. Counts correct   Infant time: 2101 Gender: female Placenta time: 2107 Apgars: 9/9 Weight: pending skin-to-skin

## 2018-07-10 ENCOUNTER — Other Ambulatory Visit: Payer: PRIVATE HEALTH INSURANCE

## 2018-10-16 ENCOUNTER — Ambulatory Visit (INDEPENDENT_AMBULATORY_CARE_PROVIDER_SITE_OTHER): Payer: PRIVATE HEALTH INSURANCE | Admitting: Internal Medicine

## 2018-10-16 ENCOUNTER — Encounter: Payer: Self-pay | Admitting: Internal Medicine

## 2018-10-16 ENCOUNTER — Other Ambulatory Visit: Payer: Self-pay

## 2018-10-16 DIAGNOSIS — H1013 Acute atopic conjunctivitis, bilateral: Secondary | ICD-10-CM | POA: Diagnosis not present

## 2018-10-16 DIAGNOSIS — Z79899 Other long term (current) drug therapy: Secondary | ICD-10-CM

## 2018-10-16 DIAGNOSIS — J3089 Other allergic rhinitis: Secondary | ICD-10-CM | POA: Diagnosis not present

## 2018-10-16 DIAGNOSIS — J309 Allergic rhinitis, unspecified: Secondary | ICD-10-CM

## 2018-10-16 MED ORDER — MONTELUKAST SODIUM 10 MG PO TABS: 10.0000 mg | ORAL_TABLET | Freq: Every day | ORAL | 3 refills | Status: AC

## 2018-10-16 MED ORDER — PREDNISONE 20 MG PO TABS: 20.0000 mg | ORAL_TABLET | Freq: Two times a day (BID) | ORAL | 0 refills | Status: DC

## 2018-10-16 NOTE — Progress Notes (Signed)
Virtual Visit via Video Note  I connected with@ on 10/16/18 at  1:15 PM EDT by a video enabled telemedicine application and verified that I am speaking with the correct person using two identifiers. Location patient: home Location provider:work  office Persons participating in the virtual visit: patient, provider  WIth national recommendations  regarding COVID 19 pandemic   video visit is advised over in office visit for this patient.  Discussed the limitations of evaluation and management by telemedicine and   Limited  availability of in person appointments. The patient expressed understanding and agreed to proceed.   HPI: Alexandra [name] Presents with  Sx of uncontrolled allergies . usually spring pollen    Using Singulair Flonase and zyrtec  And added otc allergy eye drops.  But still having sneezing fits and  Itchy eyes    At times tickle in throat feeling like need to cough but no sob .  Now working from home with covid restrictions  But has young child who likes to go outside ( in the pollen)    Last visit 11 19 med check  Last pv October 28 2017 Dog at home for a while  But no cats ROS: See pertinent positives and negatives per HPI. No fever illness  covid sx   Past Medical History:  Diagnosis Date  . ADHD (attention deficit hyperactivity disorder)   . Adjustment disorder with anxiety 08/10/2013  . Allergic rhinitis, cause unspecified   . Anal fissure   . Anemia   . Atypical chest pain   . H/O headache following lumbar puncture    done to R/O MS  . Headache(784.0)   . History of Clostridium difficile infection    2007  . Hx of varicella   . IBS (irritable bowel syndrome)   . Indication for care in labor and delivery, antepartum 03/01/2015  . Interstitial cystitis   . Kidney stones    11 16   . Low back pain   . Mononeuritis of lower limb, unspecified   . Myalgia and myositis, unspecified   . NSVD (normal spontaneous vaginal delivery) 03/02/2015  . PVC (premature  ventricular contraction)    had negative ECHO  . Screening examination for pulmonary tuberculosis   . Urinary tract infection, site not specified   . Vaginal bleeding in pregnancy   . Varicose veins    ablation    Past Surgical History:  Procedure Laterality Date  . BREAST SURGERY     augmentation  . ENDOVENOUS ABLATION SAPHENOUS VEIN W/ LASER      Family History  Problem Relation Age of Onset  . Hypertension Mother   . Hyperlipidemia Father   . Asthma Father   . Hyperlipidemia Mother        controlled with lsi    Social History   Tobacco Use  . Smoking status: Never Smoker  . Smokeless tobacco: Never Used  Substance Use Topics  . Alcohol use: No    Alcohol/week: 0.0 standard drinks  . Drug use: No      Current Outpatient Medications:  .  albuterol (PROVENTIL HFA;VENTOLIN HFA) 108 (90 Base) MCG/ACT inhaler, Inhale 2 puffs into the lungs every 6 (six) hours as needed., Disp: 1 Inhaler, Rfl: 1 .  amphetamine-dextroamphetamine (ADDERALL) 20 MG tablet, Take 1 tablet (20 mg total) by mouth 2 (two) times daily., Disp: 60 tablet, Rfl: 0 .  amphetamine-dextroamphetamine (ADDERALL) 20 MG tablet, Take 1 tablet (20 mg total) by mouth 2 (two) times daily.,  Disp: 60 tablet, Rfl: 0 .  amphetamine-dextroamphetamine (ADDERALL) 20 MG tablet, Take 1 tablet (20 mg total) by mouth 2 (two) times daily., Disp: 60 tablet, Rfl: 0 .  Butalbital-APAP-Caffeine (FIORICET) 50-300-40 MG CAPS, Take 1 capsule by mouth at bedtime., Disp: , Rfl:  .  carisoprodol (SOMA) 350 MG tablet, , Disp: , Rfl:  .  gabapentin (NEURONTIN) 800 MG tablet, Take 800 mg by mouth 5 (five) times daily., Disp: , Rfl:  .  Galcanezumab-gnlm (EMGALITY) 120 MG/ML SOAJ, Inject 120 mg into the skin every 30 (thirty) days., Disp: , Rfl:  .  HYDROcodone-acetaminophen (NORCO) 10-325 MG tablet, Take 1 tablet by mouth every 6 (six) hours as needed., Disp: , Rfl:  .  montelukast (SINGULAIR) 10 MG tablet, Take 1 tablet (10 mg total) by  mouth at bedtime., Disp: 90 tablet, Rfl: 3 .  NUVARING 0.12-0.015 MG/24HR vaginal ring, , Disp: , Rfl:  .  ondansetron (ZOFRAN) 8 MG tablet, Take 1 tablet as needed at the onset of nausea, Disp: , Rfl:  .  predniSONE (DELTASONE) 20 MG tablet, Take 1 tablet (20 mg total) by mouth 2 (two) times daily with a meal. For 3-5 days for allergy, Disp: 10 tablet, Rfl: 0 .  rizatriptan (MAXALT) 10 MG tablet, Take 10 mg by mouth as needed for migraine. May repeat in 2 hours if needed, Disp: , Rfl:   EXAM: BP Readings from Last 3 Encounters:  05/01/18 122/82  10/28/17 108/64  05/01/17 108/78    VITALS per patient if applicable:  GENERAL: alert, oriented, appears well and in no acute distress  HEENT: atraumatic, conjunttiva clear, no obvious abnormalities on inspection of external nose and ears  Looks allergic and rubs eyes at time( interview from her car)   NECK: normal movements of the head and neck  LUNGS: on inspection no signs of respiratory distress, breathing rate appears normal, no obvious gross SOB, gasping or wheezing no cough during interview and no resp distress   CV: no obvious cyanosis  MS: moves all visible extremities without noticeable abnormality  PSYCH/NEURO: pleasant and cooperative, no obvious depression or anxiety, speech and thought processing grossly intact   ASSESSMENT AND PLAN:  Discussed the following assessment and plan:  Seasonal allergic rhinitis due to other allergic trigger  Allergic conjunctivitis of both eyes and rhinitis  Medication management Allergic diathesis suspected from  Inhalants pollen etc   On triple therapy and break through  disc increase  Flonase to bid and continue  Bathe in evening to wash off pollen  Ac if possible  Etc avoidance   Try antihistamine eye drops of mast cell stabilizers  If needed can add 3-5 days of prednisone to get under  control but option up to her   No alarm sx today  Counseled.  If  persistent or progressive and  returns consider allergy referral  For eval in future  Expectant management and discussion of plan and treatment with patient with opportunity to ask questions and all were answered. The patient agreed with the plan and demonstrated an understanding of the instructions.  ( no need for adderall refill yet) The patient was advised to call back or seek an in-person evaluation if worsening  or having concerns .   Berniece AndreasWanda Najmah Carradine, MD

## 2018-12-03 ENCOUNTER — Other Ambulatory Visit: Payer: Self-pay

## 2018-12-04 MED ORDER — AMPHETAMINE-DEXTROAMPHETAMINE 20 MG PO TABS: 20.0000 mg | ORAL_TABLET | Freq: Two times a day (BID) | ORAL | 0 refills | Status: DC

## 2018-12-04 NOTE — Telephone Encounter (Signed)
Please fill in absence of Dr.Panosh

## 2018-12-04 NOTE — Telephone Encounter (Signed)
Done for 30 days  

## 2018-12-07 LAB — CBC AND DIFFERENTIAL
HCT: 43 (ref 36–46)
Hemoglobin: 15.6 (ref 12.0–16.0)
Platelets: 220 (ref 150–399)

## 2018-12-07 LAB — HIV ANTIBODY (ROUTINE TESTING W REFLEX): HIV 1&2 Ab, 4th Generation: NEGATIVE

## 2018-12-08 LAB — OB RESULTS CONSOLE GC/CHLAMYDIA
Chlamydia: NEGATIVE
Gonorrhea: NEGATIVE

## 2018-12-10 LAB — OB RESULTS CONSOLE HEPATITIS B SURFACE ANTIGEN: Hepatitis B Surface Ag: NEGATIVE

## 2018-12-10 LAB — OB RESULTS CONSOLE VARICELLA ZOSTER ANTIBODY, IGG: Varicella: IMMUNE

## 2018-12-10 LAB — OB RESULTS CONSOLE RPR: RPR: NONREACTIVE

## 2018-12-10 LAB — OB RESULTS CONSOLE RUBELLA ANTIBODY, IGM: Rubella: IMMUNE

## 2019-01-18 ENCOUNTER — Encounter: Payer: Self-pay | Admitting: Internal Medicine

## 2019-01-29 IMAGING — DX DG CHEST 2V
2 series · 3 of 3 positions shown · non-contrast
Comparison: None in PACs

CLINICAL DATA: Intermittent dyspnea and generalized chest pain for
the past month.

EXAM:
CHEST  2 VIEW

[Series 1: chest pa · 0.14mm/px · 2 of 2 slices shown]
[im 1/2]
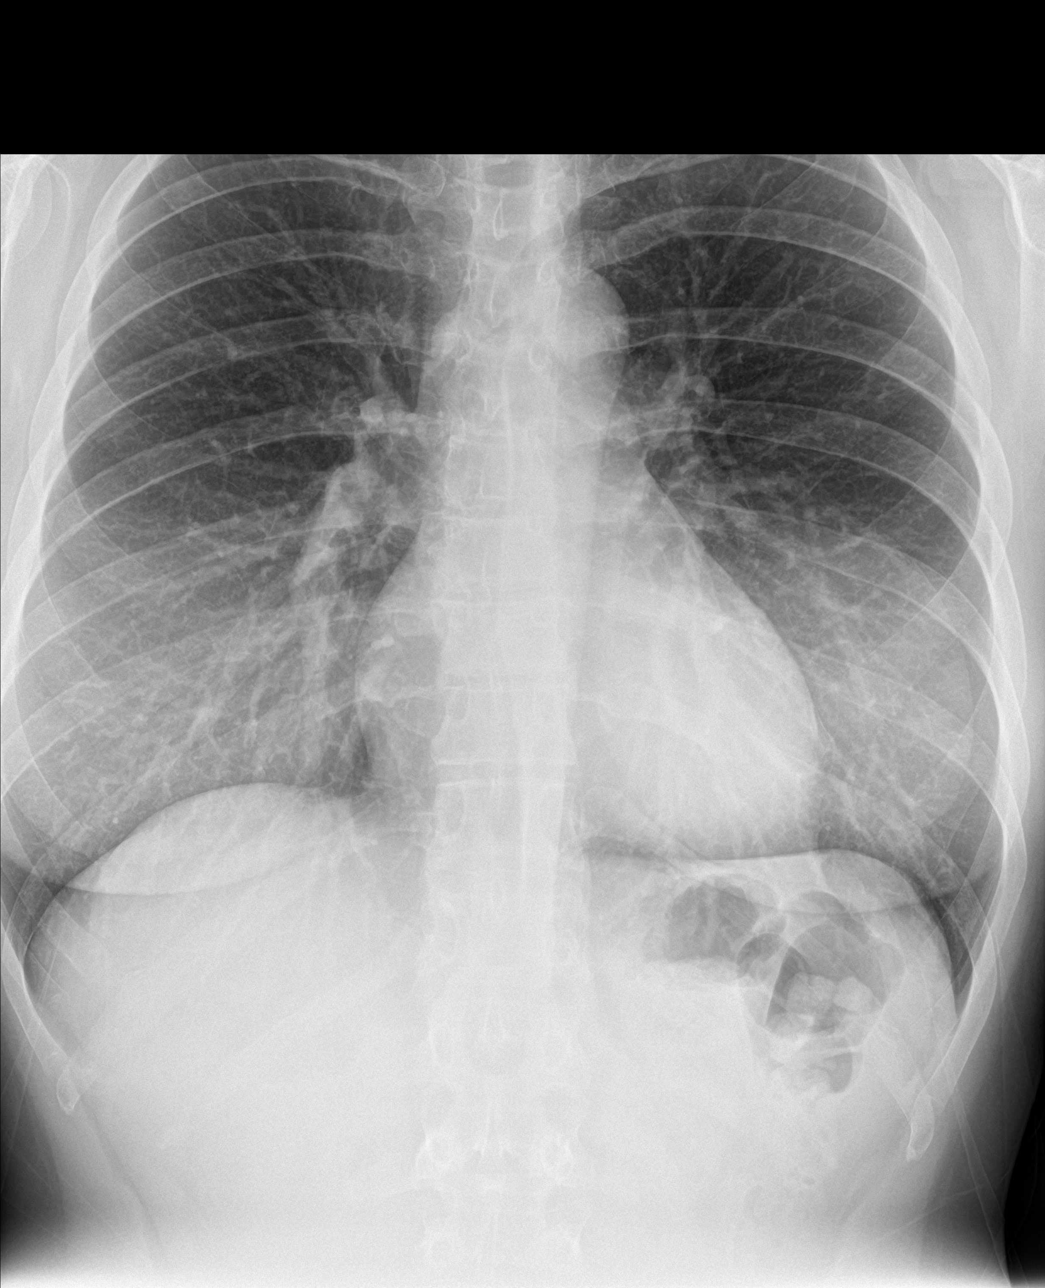
[im 2/2]
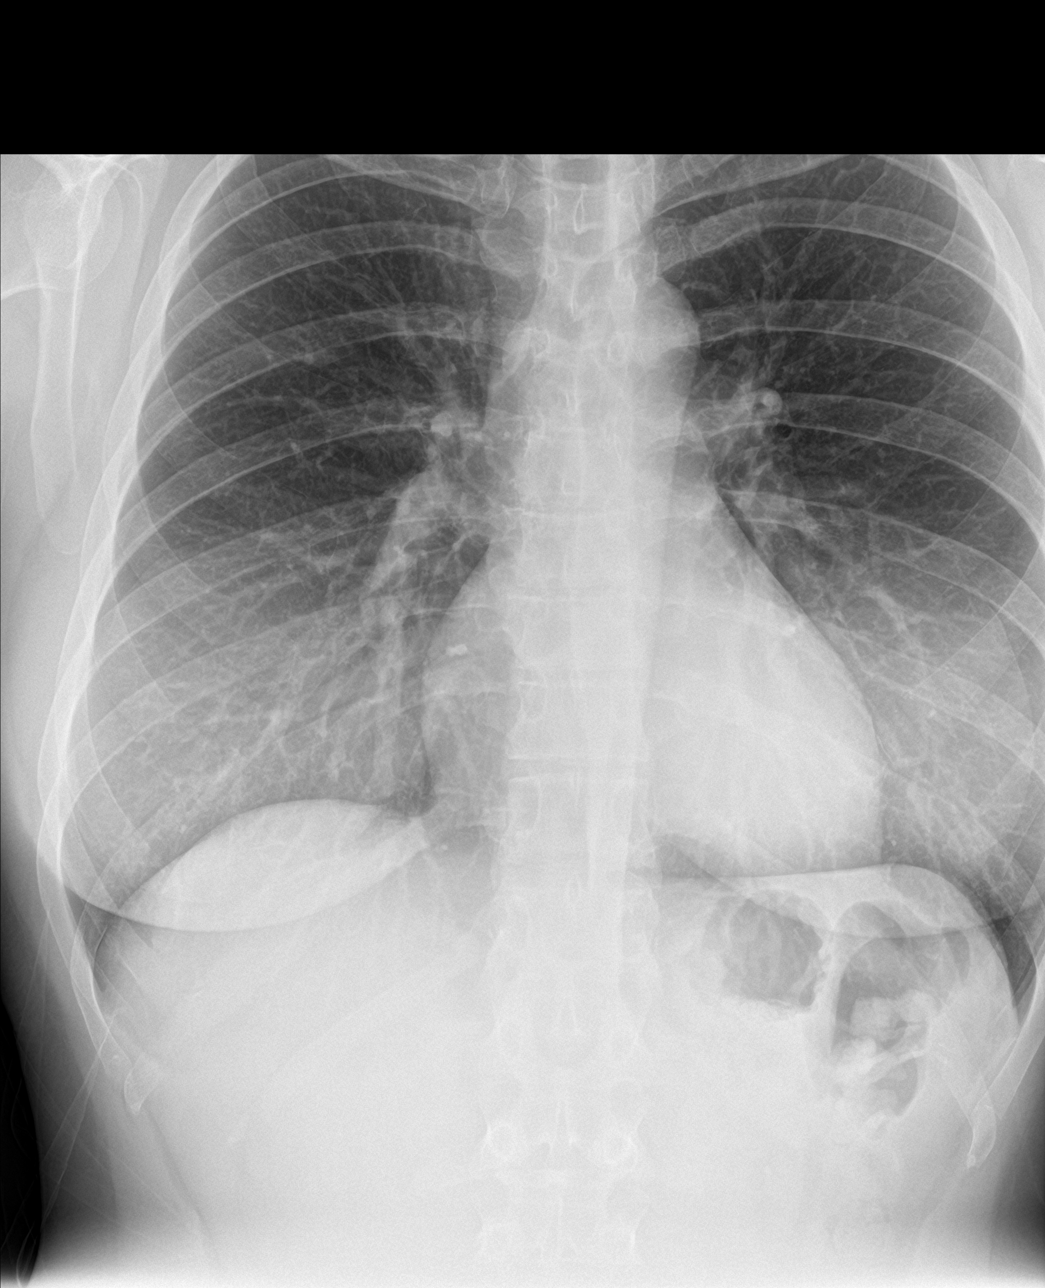

[chest lat]
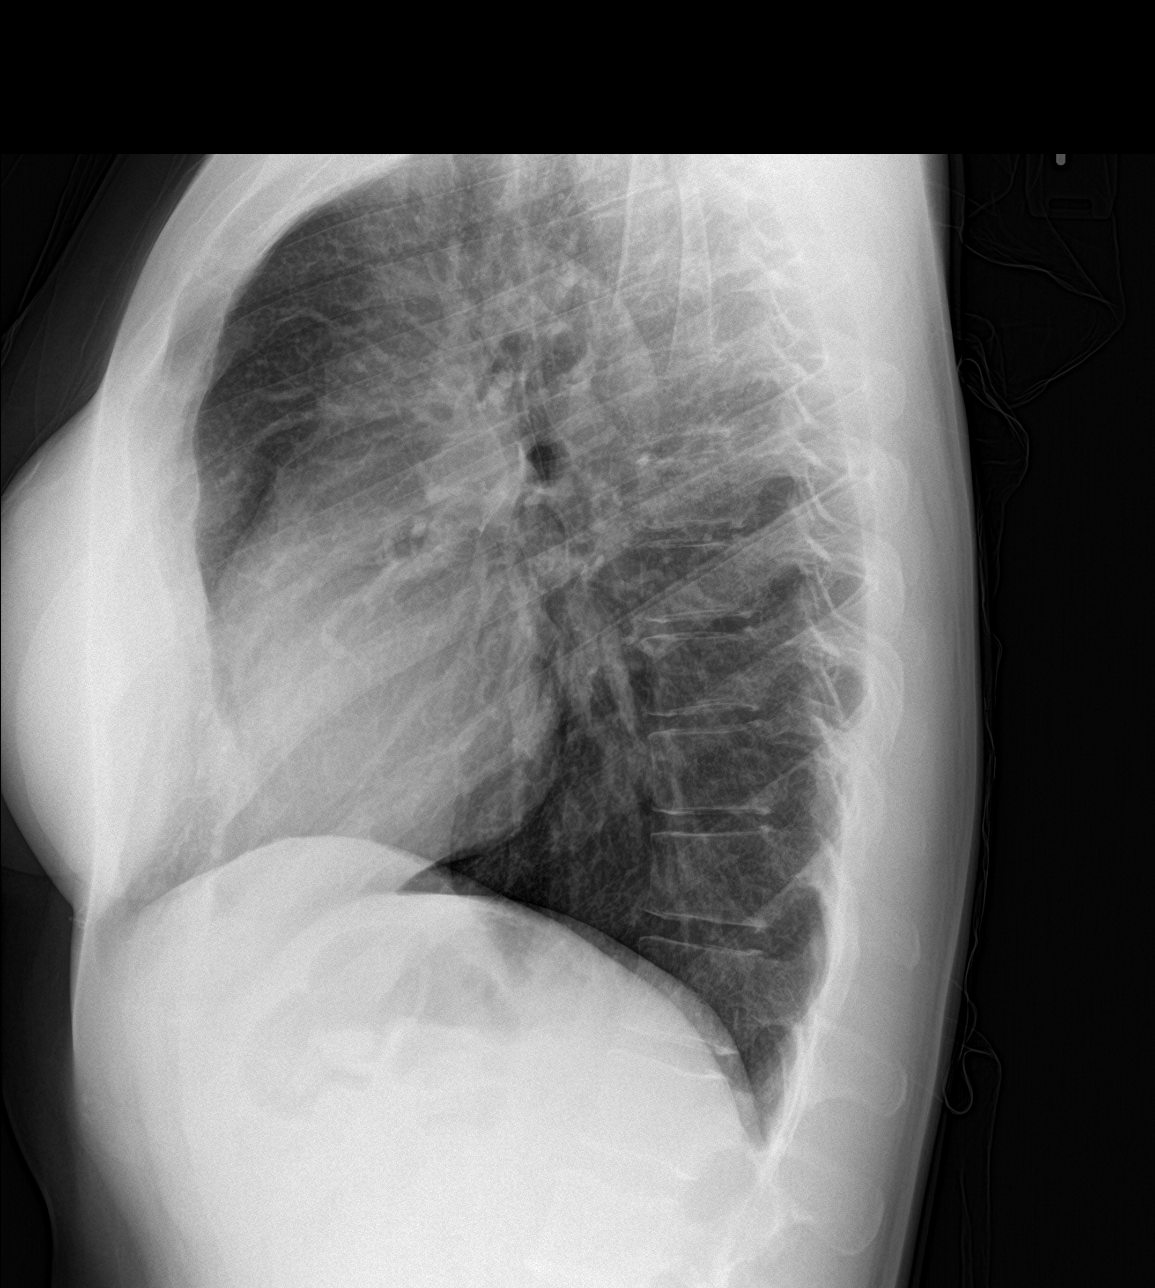

[3 of 3 positions shown; findings below may reference images not displayed]

FINDINGS: The lungs are mildly hyperinflated. There is no focal infiltrate.
There is no pleural effusion. The heart and pulmonary vascularity
are normal. The mediastinum is normal in width. The trachea is
midline. The bony thorax exhibits no acute abnormality. There are
breast implants present bilaterally.
IMPRESSION: Mild hyperinflation may be voluntary or may reflect reactive airway
disease or acute bronchitis. There is acute pneumonia.

## 2019-03-01 ENCOUNTER — Other Ambulatory Visit: Payer: Self-pay | Admitting: Family Medicine

## 2019-03-01 ENCOUNTER — Other Ambulatory Visit: Payer: Self-pay

## 2019-03-01 NOTE — Telephone Encounter (Signed)
Panosh patient.  

## 2019-03-02 NOTE — Telephone Encounter (Signed)
See rx refill request 

## 2019-03-02 NOTE — Telephone Encounter (Signed)
Last ov:10/16/2018

## 2019-03-02 NOTE — Telephone Encounter (Signed)
Last filled 11/2018

## 2019-03-02 NOTE — Telephone Encounter (Signed)
Possible duplicate rx

## 2019-03-04 NOTE — Telephone Encounter (Signed)
Patient records show that she is pre gant and do not advise taking med in pregnancy unlness  Discussion also with her OB   She should consult with her OBGyne  about taking   medication in pregnancy   And if still wished to take medication needs video visit after obgyne consults

## 2019-03-04 NOTE — Telephone Encounter (Signed)
Pt has been notified and is going to consult obgyn

## 2019-03-12 NOTE — Telephone Encounter (Signed)
Opened in error

## 2019-05-12 ENCOUNTER — Encounter (HOSPITAL_COMMUNITY): Payer: Self-pay

## 2019-05-12 ENCOUNTER — Other Ambulatory Visit: Payer: Self-pay

## 2019-05-12 ENCOUNTER — Inpatient Hospital Stay (HOSPITAL_COMMUNITY)
Admission: AD | Admit: 2019-05-12 | Discharge: 2019-05-12 | Disposition: A | Discharge status: Home / Self Care | Payer: PRIVATE HEALTH INSURANCE | Attending: Obstetrics and Gynecology | Admitting: Obstetrics and Gynecology

## 2019-05-12 DIAGNOSIS — O133 Gestational [pregnancy-induced] hypertension without significant proteinuria, third trimester: Secondary | ICD-10-CM | POA: Insufficient documentation

## 2019-05-12 DIAGNOSIS — O10913 Unspecified pre-existing hypertension complicating pregnancy, third trimester: Secondary | ICD-10-CM

## 2019-05-12 DIAGNOSIS — Z3A31 31 weeks gestation of pregnancy: Secondary | ICD-10-CM | POA: Insufficient documentation

## 2019-05-12 DIAGNOSIS — R030 Elevated blood-pressure reading, without diagnosis of hypertension: Secondary | ICD-10-CM

## 2019-05-12 DIAGNOSIS — H538 Other visual disturbances: Secondary | ICD-10-CM | POA: Diagnosis present

## 2019-05-12 LAB — CBC
HCT: 36.7 % (ref 36.0–46.0)
Hemoglobin: 12.9 g/dL (ref 12.0–15.0)
MCH: 30.9 pg (ref 26.0–34.0)
MCHC: 35.1 g/dL (ref 30.0–36.0)
MCV: 87.8 fL (ref 80.0–100.0)
Platelets: 205 10*3/uL (ref 150–400)
RBC: 4.18 MIL/uL (ref 3.87–5.11)
RDW: 12.1 % (ref 11.5–15.5)
WBC: 14.5 10*3/uL — ABNORMAL HIGH (ref 4.0–10.5)
nRBC: 0 % (ref 0.0–0.2)

## 2019-05-12 LAB — COMPREHENSIVE METABOLIC PANEL
ALT: 13 U/L (ref 0–44)
AST: 20 U/L (ref 15–41)
Albumin: 2.7 g/dL — ABNORMAL LOW (ref 3.5–5.0)
Alkaline Phosphatase: 88 U/L (ref 38–126)
Anion gap: 10 (ref 5–15)
BUN: 8 mg/dL (ref 6–20)
CO2: 20 mmol/L — ABNORMAL LOW (ref 22–32)
Calcium: 8.7 mg/dL — ABNORMAL LOW (ref 8.9–10.3)
Chloride: 106 mmol/L (ref 98–111)
Creatinine, Ser: 0.74 mg/dL (ref 0.44–1.00)
GFR calc Af Amer: >60 mL/min (ref >60)
GFR calc non Af Amer: >60 mL/min (ref >60)
Glucose, Bld: 85 mg/dL (ref 70–99)
Potassium: 3.5 mmol/L (ref 3.5–5.1)
Sodium: 136 mmol/L (ref 135–145)
Total Bilirubin: 0.3 mg/dL (ref 0.3–1.2)
Total Protein: 6.3 g/dL — ABNORMAL LOW (ref 6.5–8.1)

## 2019-05-12 LAB — URINALYSIS, ROUTINE W REFLEX MICROSCOPIC
Bilirubin Urine: NEGATIVE
Glucose, UA: NEGATIVE mg/dL
Ketones, ur: NEGATIVE mg/dL
Nitrite: NEGATIVE
Protein, ur: NEGATIVE mg/dL
Specific Gravity, Urine: 1.01 (ref 1.005–1.030)
pH: 7 (ref 5.0–8.0)

## 2019-05-12 LAB — PROTEIN / CREATININE RATIO, URINE
Creatinine, Urine: 73.05 mg/dL
Protein Creatinine Ratio: 0.11 mg/mg{Cre} (ref 0.00–0.15)
Total Protein, Urine: 8 mg/dL

## 2019-05-12 NOTE — Discharge Instructions (Signed)
Hypertension During Pregnancy °Hypertension is also called high blood pressure. High blood pressure means that the force of your blood moving in your body is too strong. It can cause problems for you and your baby. Different types of high blood pressure can happen during pregnancy. The types are: °· High blood pressure before you got pregnant. This is called chronic hypertension.  This can continue during your pregnancy. Your doctor will want to keep checking your blood pressure. You may need medicine to keep your blood pressure under control while you are pregnant. You will need follow-up visits after you have your baby. °· High blood pressure that goes up during pregnancy when it was normal before. This is called gestational hypertension. It will usually get better after you have your baby, but your doctor will need to watch your blood pressure to make sure that it is getting better. °· Very high blood pressure during pregnancy. This is called preeclampsia. Very high blood pressure is an emergency that needs to be checked and treated right away. °· You may develop very high blood pressure after giving birth. This is called postpartum preeclampsia. This usually occurs within 48 hours after childbirth but may occur up to 6 weeks after giving birth. This is rare. °How does this affect me? °If you have high blood pressure during pregnancy, you have a higher chance of developing high blood pressure: °· As you get older. °· If you get pregnant again. °In some cases, high blood pressure during pregnancy can cause: °· Stroke. °· Heart attack. °· Damage to the kidneys, lungs, or liver. °· Preeclampsia. °· Jerky movements you cannot control (convulsions or seizures). °· Problems with the placenta. °How does this affect my baby? °Your baby may: °· Be born early. °· Not weigh as much as he or she should. °· Not handle labor well, leading to a c-section birth. °What are the risks? °· Having high blood pressure during a past  pregnancy. °· Being overweight. °· Being 35 years old or older. °· Being pregnant for the first time. °· Being pregnant with more than one baby. °· Becoming pregnant using fertility methods, such as IVF. °· Having other problems, such as diabetes, or kidney disease. °· Having family members who have high blood pressure. °What can I do to lower my risk? ° °· Keep a healthy weight. °· Eat a healthy diet. °· Follow what your doctor tells you about treating any medical problems that you had before becoming pregnant. °It is very important to go to all of your doctor visits. Your doctor will check your blood pressure and make sure that your pregnancy is progressing as it should. Treatment should start early if a problem is found. °How is this treated? °Treatment for high blood pressure during pregnancy can differ depending on the type of high blood pressure you have and how serious it is. °· You may need to take blood pressure medicine. °· If you have been taking medicine for your blood pressure, you may need to change the medicine during pregnancy if it is not safe for your baby. °· If your doctor thinks that you could get very high blood pressure, he or she may tell you to take a low-dose aspirin during your pregnancy. °· If you have very high blood pressure, you may need to stay in the hospital so you and your baby can be watched closely. You may also need to take medicine to lower your blood pressure. This medicine may be given by mouth   or through an IV tube. °· In some cases, if your condition gets worse, you may need to have your baby early. °Follow these instructions at home: °Eating and drinking ° °· Drink enough fluid to keep your pee (urine) pale yellow. °· Avoid caffeine. °Lifestyle °· Do not use any products that contain nicotine or tobacco, such as cigarettes, e-cigarettes, and chewing tobacco. If you need help quitting, ask your doctor. °· Do not use alcohol or drugs. °· Avoid stress. °· Rest and get plenty  of sleep. °· Regular exercise can help. Ask your doctor what kinds of exercise are best for you. °General instructions °· Take over-the-counter and prescription medicines only as told by your doctor. °· Keep all prenatal and follow-up visits as told by your doctor. This is important. °Contact a doctor if: °· You have symptoms that your doctor told you to watch for, such as: °? Headaches. °? Nausea. °? Vomiting. °? Belly (abdominal) pain. °? Dizziness. °? Light-headedness. °Get help right away if: °· You have: °? Very bad belly pain that does not get better with treatment. °? A very bad headache that does not get better. °? Vomiting that does not get better. °? Sudden, fast weight gain. °? Sudden swelling in your hands, ankles, or face. °? Bleeding from your vagina. °? Blood in your pee. °? Blurry vision. °? Double vision. °? Shortness of breath. °? Chest pain. °? Weakness on one side of your body. °? Trouble talking. °· Your baby is not moving as much as usual. °Summary °· High blood pressure is also called hypertension. °· High blood pressure means that the force of your blood moving in your body is too strong. °· High blood pressure can cause problems for you and your baby. °· Keep all follow-up visits as told by your doctor. This is important. °This information is not intended to replace advice given to you by your health care provider. Make sure you discuss any questions you have with your health care provider. °Document Released: 07/20/2010 Document Revised: 10/08/2018 Document Reviewed: 07/14/2018 °Elsevier Patient Education © 2020 [employer] ° °

## 2019-05-12 NOTE — MAU Note (Signed)
Pt messaged office yesterday with complaints of blurred vision, abdominal pain, and pelvic pain pt was told to take her blood pressure and it was 135/90 and 135/85 15 minutes later. Pt was told to keep her appointment that she had scheduled for today and while she there she was told to come here for evaluation.  Pt reports feeling dizzy one time today and also had a headache this morning. She states that she took fiorcet. Pt states the headache is now gone.   Reports +FM   Denies vaginal bleeding or LOF.

## 2019-05-12 NOTE — MAU Provider Note (Signed)
Patient Alexandra [name] is a 33 y.o. G2P1001 At [redacted]w[redacted]d here with complaints of history of blurry vision, headache that resolved with fioricet and abdominal pain this morming. Right now is a 1/10; "I know its there but it doesn't bother me".   She denies vaginal bleeding, decreased fetal movements, LOF, NV, dysuria. She denies any complications in her pregnancy. She does not have a history of hypertension in pregnancy or CHTN.  She was seen at [facility] today and found to have elevated blood pressures (130/80-90s). Given that she also had a recent headache and changes in vision, she was sent here for further evaluation.    Her last baby was born at term, NVSD in 2016.   History     CSN: [id number]  Arrival date and time: 05/12/19 1435   None     Chief Complaint  Patient presents with  . Hypertension   Hypertension This is a new problem. Episode onset: Monday. The problem is unchanged. Pertinent negatives include no headaches. (Had blurry vision over the weekend. States that she lost vision out of her left eye peripherally ("it was a blurry white"). This was Monday night (two days ago) when she was cooking dinner. She took Tylenol and then it went away.  She has had this before when she was not pregnant. She normally gets this when she gets migaine, but did not develop a migraine this time. She woke up with a headache this morning. She took a fioricet this morning and then it went away. )  Abdominal Pain This is a new problem. The current episode started today. The pain is at a severity of 1/10. The quality of the pain is cramping. Pain radiation: feels like a stabbing pain that moves around her abdomen. Pertinent negatives include no headaches. Nothing aggravates the pain. The pain is relieved by nothing.  It is worse when she sits. It is gone now. She has not had any problems with eating, drinking, nausea, vomiting, diarrhea or contractions.   OB History    Gravida  2   Para   1   Term  1   Preterm      AB      Living  1     SAB      TAB      Ectopic      Multiple  0   Live Births  1           Past Medical History:  Diagnosis Date  . ADHD (attention deficit hyperactivity disorder)   . Adjustment disorder with anxiety 08/10/2013  . Allergic rhinitis, cause unspecified   . Anal fissure   . Anemia   . Atypical chest pain   . H/O headache following lumbar puncture    done to R/O MS  . Headache(784.0)   . History of Clostridium difficile infection    2007  . Hx of varicella   . IBS (irritable bowel syndrome)   . Indication for care in labor and delivery, antepartum 03/01/2015  . Interstitial cystitis   . Kidney stones    11 16   . Low back pain   . Mononeuritis of lower limb, unspecified   . Myalgia and myositis, unspecified   . NSVD (normal spontaneous vaginal delivery) 03/02/2015  . PVC (premature ventricular contraction)    had negative ECHO  . Screening examination for pulmonary tuberculosis   . Urinary tract infection, site not specified   . Vaginal bleeding in pregnancy   .  Varicose veins    ablation    Past Surgical History:  Procedure Laterality Date  . BREAST ENHANCEMENT SURGERY  2011  . BREAST IMPLANT REMOVAL    . BREAST SURGERY     augmentation  . ENDOVENOUS ABLATION SAPHENOUS VEIN W/ LASER      Family History  Problem Relation Age of Onset  . Hypertension Mother   . Hyperlipidemia Mother        controlled with lsi  . Hyperlipidemia Father   . Asthma Father     Social History   Tobacco Use  . Smoking status: Never Smoker  . Smokeless tobacco: Never Used  Substance Use Topics  . Alcohol use: No    Alcohol/week: 0.0 standard drinks  . Drug use: No    Allergies:  Allergies  Allergen Reactions  . Elmiron [Pentosan Polysulfate Sodium] Swelling  . Lexapro [Escitalopram Oxalate] Other (See Comments)    Aggravated migraines  2015  . Septra [Bactrim] Rash  . Sulfa Antibiotics Rash    Medications  Prior to Admission  Medication Sig Dispense Refill Last Dose  . Butalbital-APAP-Caffeine (FIORICET) 50-300-40 MG CAPS Take 1 capsule by mouth at bedtime.   05/12/2019 at Unknown time  . cetirizine (ZYRTEC) 10 MG tablet Take 10 mg by mouth daily.   05/11/2019 at Unknown time  . diphenhydrAMINE (BENADRYL) 25 MG tablet Take 25 mg by mouth every 6 (six) hours as needed.   05/11/2019 at Unknown time  . pantoprazole (PROTONIX) 40 MG tablet Take 40 mg by mouth daily.   05/11/2019 at Unknown time  . prenatal vitamin w/FE, FA (PRENATAL 1 + 1) 27-1 MG TABS tablet Take 1 tablet by mouth daily at 12 noon.   05/11/2019 at Unknown time  . albuterol (PROVENTIL HFA;VENTOLIN HFA) 108 (90 Base) MCG/ACT inhaler Inhale 2 puffs into the lungs every 6 (six) hours as needed. 1 Inhaler 1 More than a month at Unknown time  . amphetamine-dextroamphetamine (ADDERALL) 20 MG tablet Take 1 tablet (20 mg total) by mouth 2 (two) times daily. 60 tablet 0 More than a month at Unknown time  . amphetamine-dextroamphetamine (ADDERALL) 20 MG tablet Take 1 tablet (20 mg total) by mouth 2 (two) times daily. 60 tablet 0 More than a month at Unknown time  . amphetamine-dextroamphetamine (ADDERALL) 20 MG tablet Take 1 tablet (20 mg total) by mouth 2 (two) times daily for 30 days. 60 tablet 0   . carisoprodol (SOMA) 350 MG tablet    More than a month at Unknown time  . gabapentin (NEURONTIN) 800 MG tablet Take 800 mg by mouth 5 (five) times daily.   More than a month at Unknown time  . Galcanezumab-gnlm (EMGALITY) 120 MG/ML SOAJ Inject 120 mg into the skin every 30 (thirty) days.   More than a month at Unknown time  . HYDROcodone-acetaminophen (NORCO) 10-325 MG tablet Take 1 tablet by mouth every 6 (six) hours as needed.   More than a month at Unknown time  . montelukast (SINGULAIR) 10 MG tablet Take 1 tablet (10 mg total) by mouth at bedtime. 90 tablet 3 More than a month at Unknown time  . NUVARING 0.12-0.015 MG/24HR vaginal ring      .  ondansetron (ZOFRAN) 8 MG tablet Take 1 tablet as needed at the onset of nausea     . predniSONE (DELTASONE) 20 MG tablet Take 1 tablet (20 mg total) by mouth 2 (two) times daily with a meal. For 3-5 days for allergy 10 tablet 0 More  than a month at Unknown time  . rizatriptan (MAXALT) 10 MG tablet Take 10 mg by mouth as needed for migraine. May repeat in 2 hours if needed   More than a month at Unknown time    Review of Systems  Constitutional: Negative.   HENT: Negative.   Respiratory: Negative.   Cardiovascular: Negative.   Gastrointestinal: Negative for abdominal pain.  Musculoskeletal: Negative.   Neurological: Negative for headaches.  Psychiatric/Behavioral: Negative.    Physical Exam   Blood pressure 121/77, pulse 80, temperature 98.5 F (36.9 C), resp. rate 20, weight 104 kg, SpO2 98 %.  Physical Exam  Constitutional: She appears well-developed.  HENT:  Head: Normocephalic.  Neck: Normal range of motion.  Respiratory: Effort normal and breath sounds normal.  GI: Soft.  Musculoskeletal: Normal range of motion.  Neurological: She is alert.  Skin: Skin is warm and dry.    MAU Course  Procedures  MDM Patient Vitals for the past 24 hrs:  BP Temp Pulse Resp SpO2 Weight  05/12/19 1716 121/77 - 80 - - -  05/12/19 1701 117/77 - 78 - - -  05/12/19 1646 119/75 - 77 - - -  05/12/19 1631 121/66 - 87 - - -  05/12/19 1616 125/83 - 88 - - -  05/12/19 1601 121/88 - 91 - - -  05/12/19 1546 127/87 - 91 - - -  05/12/19 1531 (!) 134/91 - 86 - - -  05/12/19 1513 120/86 - 84 - - -  05/12/19 1512 120/86 98.5 F (36.9 C) 89 20 98 % -  05/12/19 1503 - - - - - 104 kg     -NST: 135 bpm, mod var, present acel, negative decels, no contractions.  -CBC, CMP and PCR are all normal.     Assessment and Plan   1. Transient hypertension    2. Patient without all normotensive BPs in MAU except for one at 1730. Patient continues to deny HA, vision changes, abdominal pain at this  time. -Keep appt on 11/25 -check blood pressure at home this weekend or early next week; call her provider if elevated greater than 140/90.  -strict return precautions reviewed.  -Patient verbalized understanding.   Charlesetta GaribaldiKathryn Lorraine Kooistra 05/12/2019, 5:35 PM

## 2019-06-10 LAB — OB RESULTS CONSOLE GBS: GBS: NEGATIVE

## 2019-06-22 ENCOUNTER — Telehealth (HOSPITAL_COMMUNITY): Payer: Self-pay | Admitting: *Deleted

## 2019-06-22 ENCOUNTER — Encounter (HOSPITAL_COMMUNITY): Payer: Self-pay | Admitting: *Deleted

## 2019-06-22 NOTE — Telephone Encounter (Signed)
Preadmission screen  

## 2019-06-30 ENCOUNTER — Encounter (HOSPITAL_COMMUNITY): Payer: Self-pay | Admitting: Obstetrics and Gynecology

## 2019-06-30 ENCOUNTER — Other Ambulatory Visit: Payer: Self-pay

## 2019-06-30 ENCOUNTER — Inpatient Hospital Stay (HOSPITAL_COMMUNITY)
Admission: AD | Admit: 2019-06-30 | Discharge: 2019-07-02 | DRG: 807 | Disposition: A | Discharge status: Home / Self Care | Payer: PRIVATE HEALTH INSURANCE | Attending: Obstetrics and Gynecology | Admitting: Obstetrics and Gynecology

## 2019-06-30 ENCOUNTER — Inpatient Hospital Stay (HOSPITAL_COMMUNITY): Payer: PRIVATE HEALTH INSURANCE | Admitting: Anesthesiology

## 2019-06-30 DIAGNOSIS — Z20822 Contact with and (suspected) exposure to covid-19: Secondary | ICD-10-CM | POA: Diagnosis present

## 2019-06-30 DIAGNOSIS — Z3A38 38 weeks gestation of pregnancy: Secondary | ICD-10-CM

## 2019-06-30 DIAGNOSIS — O134 Gestational [pregnancy-induced] hypertension without significant proteinuria, complicating childbirth: Principal | ICD-10-CM | POA: Diagnosis present

## 2019-06-30 DIAGNOSIS — O133 Gestational [pregnancy-induced] hypertension without significant proteinuria, third trimester: Secondary | ICD-10-CM | POA: Diagnosis not present

## 2019-06-30 LAB — CBC
HCT: 34.7 % — ABNORMAL LOW (ref 36.0–46.0)
Hemoglobin: 11.7 g/dL — ABNORMAL LOW (ref 12.0–15.0)
MCH: 27.8 pg (ref 26.0–34.0)
MCHC: 33.7 g/dL (ref 30.0–36.0)
MCV: 82.4 fL (ref 80.0–100.0)
Platelets: 246 10*3/uL (ref 150–400)
RBC: 4.21 MIL/uL (ref 3.87–5.11)
RDW: 12.6 % (ref 11.5–15.5)
WBC: 11.7 10*3/uL — ABNORMAL HIGH (ref 4.0–10.5)
nRBC: 0 % (ref 0.0–0.2)

## 2019-06-30 LAB — TYPE AND SCREEN
ABO/RH(D): B POS
Antibody Screen: NEGATIVE

## 2019-06-30 LAB — COMPREHENSIVE METABOLIC PANEL
ALT: 14 U/L (ref 0–44)
AST: 22 U/L (ref 15–41)
Albumin: 2.3 g/dL — ABNORMAL LOW (ref 3.5–5.0)
Alkaline Phosphatase: 124 U/L (ref 38–126)
Anion gap: 9 (ref 5–15)
BUN: 7 mg/dL (ref 6–20)
CO2: 22 mmol/L (ref 22–32)
Calcium: 9.1 mg/dL (ref 8.9–10.3)
Chloride: 108 mmol/L (ref 98–111)
Creatinine, Ser: 0.93 mg/dL (ref 0.44–1.00)
GFR calc Af Amer: >60 mL/min (ref >60)
GFR calc non Af Amer: >60 mL/min (ref >60)
Glucose, Bld: 90 mg/dL (ref 70–99)
Potassium: 3.7 mmol/L (ref 3.5–5.1)
Sodium: 139 mmol/L (ref 135–145)
Total Bilirubin: 0.2 mg/dL — ABNORMAL LOW (ref 0.3–1.2)
Total Protein: 5.5 g/dL — ABNORMAL LOW (ref 6.5–8.1)

## 2019-06-30 LAB — PROTEIN / CREATININE RATIO, URINE
Creatinine, Urine: 34.31 mg/dL
Protein Creatinine Ratio: 0.17 mg/mg{Cre} — ABNORMAL HIGH (ref 0.00–0.15)
Total Protein, Urine: 6 mg/dL

## 2019-06-30 LAB — RPR: RPR Ser Ql: NONREACTIVE

## 2019-06-30 LAB — SARS CORONAVIRUS 2 (TAT 6-24 HRS): SARS Coronavirus 2: NEGATIVE

## 2019-06-30 LAB — ABO/RH: ABO/RH(D): B POS

## 2019-06-30 MED ORDER — OXYTOCIN 40 UNITS IN NORMAL SALINE INFUSION - SIMPLE MED: 2.5000 [IU]/h | INTRAVENOUS | Status: DC

## 2019-06-30 MED ORDER — DIPHENHYDRAMINE HCL 25 MG PO CAPS: 25.0000 mg | ORAL_CAPSULE | Freq: Four times a day (QID) | ORAL | Status: DC | PRN

## 2019-06-30 MED ORDER — PHENYLEPHRINE 40 MCG/ML (10ML) SYRINGE FOR IV PUSH (FOR BLOOD PRESSURE SUPPORT): 80.0000 ug | PREFILLED_SYRINGE | INTRAVENOUS | Status: DC | PRN

## 2019-06-30 MED ORDER — PRENATAL MULTIVITAMIN CH
1.0000 | ORAL_TABLET | Freq: Every day | ORAL | Status: DC
  Administered 2019-07-01: 1 via ORAL
  Filled 2019-06-30 (×2): qty 1

## 2019-06-30 MED ORDER — SIMETHICONE 80 MG PO CHEW: 80.0000 mg | CHEWABLE_TABLET | ORAL | Status: DC | PRN

## 2019-06-30 MED ORDER — SOD CITRATE-CITRIC ACID 500-334 MG/5ML PO SOLN: 30.0000 mL | ORAL | Status: DC | PRN

## 2019-06-30 MED ORDER — LACTATED RINGERS IV SOLN: 500.0000 mL | Freq: Once | INTRAVENOUS | Status: DC

## 2019-06-30 MED ORDER — ONDANSETRON HCL 4 MG/2ML IJ SOLN: 4.0000 mg | INTRAMUSCULAR | Status: DC | PRN

## 2019-06-30 MED ORDER — COCONUT OIL OIL: 1.0000 "application " | TOPICAL_OIL | Status: DC | PRN

## 2019-06-30 MED ORDER — WITCH HAZEL-GLYCERIN EX PADS: 1.0000 "application " | MEDICATED_PAD | CUTANEOUS | Status: DC | PRN

## 2019-06-30 MED ORDER — DIPHENHYDRAMINE HCL 50 MG/ML IJ SOLN: 12.5000 mg | INTRAMUSCULAR | Status: DC | PRN

## 2019-06-30 MED ORDER — ONDANSETRON HCL 4 MG/2ML IJ SOLN: 4.0000 mg | Freq: Four times a day (QID) | INTRAMUSCULAR | Status: DC | PRN

## 2019-06-30 MED ORDER — OXYTOCIN BOLUS FROM INFUSION
500.0000 mL | Freq: Once | INTRAVENOUS | Status: AC
  Administered 2019-06-30: 500 mL via INTRAVENOUS

## 2019-06-30 MED ORDER — DOCUSATE SODIUM 100 MG PO CAPS
100.0000 mg | ORAL_CAPSULE | Freq: Two times a day (BID) | ORAL | Status: DC
  Administered 2019-07-01 – 2019-07-02 (×3): 100 mg via ORAL
  Filled 2019-06-30 (×3): qty 1

## 2019-06-30 MED ORDER — LIDOCAINE HCL (PF) 1 % IJ SOLN: 30.0000 mL | INTRAMUSCULAR | Status: DC | PRN

## 2019-06-30 MED ORDER — SENNOSIDES-DOCUSATE SODIUM 8.6-50 MG PO TABS
2.0000 | ORAL_TABLET | ORAL | Status: DC
  Administered 2019-07-01: 2 via ORAL
  Filled 2019-06-30: qty 2

## 2019-06-30 MED ORDER — IBUPROFEN 600 MG PO TABS
600.0000 mg | ORAL_TABLET | Freq: Four times a day (QID) | ORAL | Status: DC
  Administered 2019-07-01 – 2019-07-02 (×5): 600 mg via ORAL
  Filled 2019-06-30 (×6): qty 1

## 2019-06-30 MED ORDER — SODIUM CHLORIDE (PF) 0.9 % IJ SOLN
INTRAMUSCULAR | Status: DC | PRN
  Administered 2019-06-30: 12 mL/h via EPIDURAL

## 2019-06-30 MED ORDER — TETANUS-DIPHTH-ACELL PERTUSSIS 5-2.5-18.5 LF-MCG/0.5 IM SUSP: 0.5000 mL | Freq: Once | INTRAMUSCULAR | Status: DC

## 2019-06-30 MED ORDER — OXYCODONE-ACETAMINOPHEN 5-325 MG PO TABS: 2.0000 | ORAL_TABLET | ORAL | Status: DC | PRN

## 2019-06-30 MED ORDER — ACETAMINOPHEN 325 MG PO TABS: 650.0000 mg | ORAL_TABLET | ORAL | Status: DC | PRN

## 2019-06-30 MED ORDER — ONDANSETRON HCL 4 MG PO TABS: 4.0000 mg | ORAL_TABLET | ORAL | Status: DC | PRN

## 2019-06-30 MED ORDER — ZOLPIDEM TARTRATE 5 MG PO TABS: 5.0000 mg | ORAL_TABLET | Freq: Every evening | ORAL | Status: DC | PRN

## 2019-06-30 MED ORDER — DIBUCAINE (PERIANAL) 1 % EX OINT: 1.0000 "application " | TOPICAL_OINTMENT | CUTANEOUS | Status: DC | PRN

## 2019-06-30 MED ORDER — FENTANYL-BUPIVACAINE-NACL 0.5-0.125-0.9 MG/250ML-% EP SOLN: 12.0000 mL/h | EPIDURAL | Status: DC | PRN

## 2019-06-30 MED ORDER — EPHEDRINE 5 MG/ML INJ: 10.0000 mg | INTRAVENOUS | Status: DC | PRN

## 2019-06-30 MED ORDER — LACTATED RINGERS IV SOLN: INTRAVENOUS | Status: DC

## 2019-06-30 MED ORDER — OXYCODONE-ACETAMINOPHEN 5-325 MG PO TABS: 1.0000 | ORAL_TABLET | ORAL | Status: DC | PRN

## 2019-06-30 MED ORDER — TERBUTALINE SULFATE 1 MG/ML IJ SOLN: 0.2500 mg | Freq: Once | INTRAMUSCULAR | Status: DC | PRN

## 2019-06-30 MED ORDER — BUTORPHANOL TARTRATE 1 MG/ML IJ SOLN
1.0000 mg | INTRAMUSCULAR | Status: DC | PRN
  Administered 2019-06-30 (×2): 1 mg via INTRAVENOUS
  Filled 2019-06-30 (×2): qty 1

## 2019-06-30 MED ORDER — FENTANYL-BUPIVACAINE-NACL 0.5-0.125-0.9 MG/250ML-% EP SOLN
12.0000 mL/h | EPIDURAL | Status: DC | PRN
  Filled 2019-06-30 (×2): qty 250

## 2019-06-30 MED ORDER — LIDOCAINE HCL (PF) 1 % IJ SOLN
INTRAMUSCULAR | Status: DC | PRN
  Administered 2019-06-30: 6 mL via EPIDURAL

## 2019-06-30 MED ORDER — PANTOPRAZOLE SODIUM 40 MG PO TBEC
40.0000 mg | DELAYED_RELEASE_TABLET | Freq: Every day | ORAL | Status: DC
  Filled 2019-06-30 (×2): qty 1

## 2019-06-30 MED ORDER — LACTATED RINGERS IV SOLN: 500.0000 mL | INTRAVENOUS | Status: DC | PRN

## 2019-06-30 MED ORDER — BENZOCAINE-MENTHOL 20-0.5 % EX AERO
1.0000 "application " | INHALATION_SPRAY | CUTANEOUS | Status: DC | PRN
  Administered 2019-07-01: 1 via TOPICAL
  Filled 2019-06-30: qty 56

## 2019-06-30 MED ORDER — OXYTOCIN 40 UNITS IN NORMAL SALINE INFUSION - SIMPLE MED
1.0000 m[IU]/min | INTRAVENOUS | Status: DC
  Administered 2019-06-30: 2 m[IU]/min via INTRAVENOUS
  Filled 2019-06-30: qty 1000

## 2019-06-30 NOTE — Progress Notes (Signed)
Labor Note  S: comfortable s/p epidural, more rpessure in pelvis. Had "shakes" earlier but denies fevesr, chills  O: BP 132/90   Pulse 87   Temp (P) 99.6 F (37.6 C) (Oral)   Resp 18   Ht 5\' 10"  (1.778 m)   Wt 109.9 kg   SpO2 99%   BMI 34.76 kg/m  CE: 5/75/-2, improved fetal station FHR: Baseline 155, +accels, -decels, mod variability TOCO 3-58m, pitocin at 18 mU/min  A/P: This is a 33 y.o. G2P1001 at [redacted]w[redacted]d  admitted in early labor, GSB neg. Elevated BP on admission, PreE labs WNL FWB: Category 1, however baseline elevated from prior exam. Monitor for s/sxs of infection closely. MWB: comfortable s/p epidural Labor course: Progressing, now active, IUPC placed, continue to titrate pitocin  Anticipate SVD

## 2019-06-30 NOTE — H&P (Signed)
Alexandra [name] is a 33 y.o. female presenting for r/o labor. Notes contractions q2-66min. +FM, denies VB, LOF, CTX> Of note, BP elevated in MAU (disastolics in 90s).Patient had one episode of elevated BP at about 30wks with intermittent HA, however PreE labs WNL and BP has remained stable for remainder of pregnancy  [facility] c/b umbilical hernia , resolved LLP, GERD w/ protonix. GBS neg. Pelvis proven to 7lb7oz  At this time, s/p stadol with mild improvement in pain. Desires epidural.Husband coming between 8-9  OB History    Gravida  2   Para  1   Term  1   Preterm      AB      Living  1     SAB      TAB      Ectopic      Multiple  0   Live Births  1          Past Medical History:  Diagnosis Date  . ADHD (attention deficit hyperactivity disorder)   . Adjustment disorder with anxiety 08/10/2013  . Allergic rhinitis, cause unspecified   . Anal fissure   . Anemia   . Atypical chest pain   . H/O headache following lumbar puncture    done to R/O MS  . Headache(784.0)   . History of Clostridium difficile infection    2007  . Hx of varicella   . IBS (irritable bowel syndrome)   . Indication for care in labor and delivery, antepartum 03/01/2015  . Interstitial cystitis   . Kidney stones    11 16   . Low back pain   . Mononeuritis of lower limb, unspecified   . Myalgia and myositis, unspecified   . NSVD (normal spontaneous vaginal delivery) 03/02/2015  . PVC (premature ventricular contraction)    had negative ECHO  . Screening examination for pulmonary tuberculosis   . Urinary tract infection, site not specified   . Vaginal bleeding in pregnancy   . Varicose veins    ablation   Past Surgical History:  Procedure Laterality Date  . BREAST ENHANCEMENT SURGERY  2011  . BREAST IMPLANT REMOVAL    . BREAST SURGERY     augmentation  . ENDOVENOUS ABLATION SAPHENOUS VEIN W/ LASER     Family History: family history includes Asthma in her father; Hyperlipidemia in her  father and mother; Hypertension in her mother. Social History:  reports that she has never smoked. She has never used smokeless tobacco. She reports that she does not drink alcohol or use drugs.     Maternal Diabetes: No (elevated 1hr @ 159, however 3hr 0 of 4) Genetic Screening: Normal Maternal Ultrasounds/Referrals: Normal Fetal Ultrasounds or other Referrals:  None Maternal Substance Abuse:  No Significant Maternal Medications:  Meds include: Protonix Significant Maternal Lab Results:  Group B Strep negative Other Comments:  None  Review of Systems  Constitutional: Negative for chills and fever.  Respiratory: Negative for shortness of breath.   Cardiovascular: Negative for chest pain, palpitations and leg swelling.  Gastrointestinal: Positive for abdominal pain (w/ contractions). Negative for vomiting.  Neurological: Negative for dizziness, weakness and headaches.  Psychiatric/Behavioral: Negative for suicidal ideas.   Maternal Medical History:  Contractions: Onset was 1-2 hours ago.   Frequency: regular.   Perceived severity is moderate.    Fetal activity: Perceived fetal activity is normal.    Prenatal complications: Placental abnormality (resolved low lying placenta).   No bleeding, IUGR or preterm labor.   Prenatal Complications -  Diabetes: none. Elevated 1hr, 3hr WNL  Dilation: (P) 3.5 Effacement (%): (P) 60 Station: (P) -3 Exam by:: (P) Taym Twist Blood pressure 123/84, pulse 84, temperature 98.7 F (37.1 C), temperature source Oral, resp. rate 17, height 5\' 10"  (1.778 m), weight 109.9 kg, SpO2 96 %. Exam Physical Exam  Constitutional: She is oriented to person, place, and time. She appears well-developed and well-nourished. No distress.  HENT:  Head: Normocephalic and atraumatic.  Eyes: Pupils are equal, round, and reactive to light.  Cardiovascular: Normal rate and regular rhythm. Exam reveals no gallop.  No murmur heard. GI: There is abdominal tenderness.  There is no rebound and no guarding.  Genitourinary:    Vagina and uterus normal.   Musculoskeletal:        General: Normal range of motion.     Cervical back: Normal range of motion and neck supple.  Neurological: She is alert and oriented to person, place, and time.  Skin: Skin is warm and dry.    Prenatal labs: ABO, Rh: --/--/B POS, B POS Performed at [facility], [place] [address] [place], [place] [address]  [phone]) Antibody: NEG (12/30 0248) Rubella: Immune (06/11 0000) RPR: Nonreactive (06/11 0000)  HBsAg: Negative (06/11 0000)  HIV: negative (06/08 0000)  GBS: Negative/-- (12/10 0000) neg  Category 1 tracing w/ baseline 130 TOCO q46min  11.7/34.7/246, uPC 0.17, 22/14, Cr 0.93  Assessment/Plan: This is a 33yo G2P1001 admitted in early labor with GHTN. Dated by LMP c/w 1st trim scan. GBS neg, pelvis proven to 7lb7oz. EFW 3300g.  GHTN: Labs @ 31wks WNL, BP WNL antepartum as well as on admission, initial elevation may be 2/2 pain. Asx at this time, continue to monitor  Admitted to L&D, in early labor, progressing appropriately. Augment PRN, desires epidural at this time. Hold AROM until FOB arrives. CLD, CEFM, anticipate SVD.    [place] 06/30/2019, 7:46 AM

## 2019-06-30 NOTE — MAU Provider Note (Signed)
Chief Complaint  Patient presents with  . Contractions   S: Alexandra [name]  is a 33 y.o. y.o. year old G28P1001 female at [redacted]w[redacted]d weeks gestation who presents to MAU for labor evaluation. She reports contractions are occurring "back to back". Rates abdominal pain 9/10. Upon arrival to MAU she had elevated BP- denies hx of diagnoses of hypertension. She reports being followed in the office for elevated BP over the past 6 weeks.  She denies HA, vision changes. She reports RUQ tenderness that radiates across epigastric region.   Vaginal bleeding: none Fetal movement: present   Patient reports being 1cm/50/-2 in the office on Monday.  O:  Patient Vitals for the past 24 hrs:  BP Temp Pulse Resp SpO2 Weight  06/30/19 0415 (!) 133/92 -- 90 -- -- --  06/30/19 0345 129/87 -- 92 -- 96 % --  06/30/19 0315 128/88 -- 92 -- 100 % --  06/30/19 0300 (!) 142/92 -- 85 -- 99 % --  06/30/19 0245 (!) 135/92 -- 89 -- 96 % --  06/30/19 0230 (!) 132/91 -- 90 -- 96 % --  06/30/19 0214 (!) 135/93 97.8 F (36.6 C) 90 20 93 % --  06/30/19 0206 -- -- -- -- -- 109.9 kg   General: NAD Heart: Regular rate Lungs: Normal rate and effort Abd: Soft, NT, Gravid, S=D Extremities: no pedal edema Neuro: 2+ deep tendon reflexes, No clonus Pelvic: NEFG, no bleeding or LOF.    Initial cervical examination @0215  Dilation: 1.5 Station: -2 Presentation: Vertex Exam by:: Anna Cioce RN   FHR: 135/ moderate/ +accels/ no decelerations  Toco: 2-3 minutes   Results for orders placed or performed during the hospital encounter of 06/30/19 (from the past 24 hour(s))  Protein / creatinine ratio, urine     Status: Abnormal   Collection Time: 06/30/19  2:46 AM  Result Value Ref Range   Creatinine, Urine 34.31 mg/dL   Total Protein, Urine 6 mg/dL   Protein Creatinine Ratio 0.17 (H) 0.00 - 0.15 mg/mg[Cre]  CBC     Status: Abnormal   Collection Time: 06/30/19  2:48 AM  Result Value Ref Range   WBC 11.7 (H) 4.0 - 10.5 K/uL    RBC 4.21 3.87 - 5.11 MIL/uL   Hemoglobin 11.7 (L) 12.0 - 15.0 g/dL   HCT 34.7 (L) 36.0 - 46.0 %   MCV 82.4 80.0 - 100.0 fL   MCH 27.8 26.0 - 34.0 pg   MCHC 33.7 30.0 - 36.0 g/dL   RDW 12.6 11.5 - 15.5 %   Platelets 246 150 - 400 K/uL   nRBC 0.0 0.0 - 0.2 %  Comprehensive metabolic panel     Status: Abnormal   Collection Time: 06/30/19  2:48 AM  Result Value Ref Range   Sodium 139 135 - 145 mmol/L   Potassium 3.7 3.5 - 5.1 mmol/L   Chloride 108 98 - 111 mmol/L   CO2 22 22 - 32 mmol/L   Glucose, Bld 90 70 - 99 mg/dL   BUN 7 6 - 20 mg/dL   Creatinine, Ser 0.93 0.44 - 1.00 mg/dL   Calcium 9.1 8.9 - 10.3 mg/dL   Total Protein 5.5 (L) 6.5 - 8.1 g/dL   Albumin 2.3 (L) 3.5 - 5.0 g/dL   AST 22 15 - 41 U/L   ALT 14 0 - 44 U/L   Alkaline Phosphatase 124 38 - 126 U/L   Total Bilirubin 0.2 (L) 0.3 - 1.2 mg/dL   GFR calc non  Af Amer >60 >60 mL/min   GFR calc Af Amer >60 >60 mL/min   Anion gap 9 5 - 15   PEC labs negative  Chart review of prenatal records noted elevated BP at [redacted]w[redacted]d which was 130/90 and sent to MAU for evaluation that day and another BP of 134/91  Cervix rechecked @ 0403 Dilation: 2.5 Effacement (%): 60 Station: -2 Presentation: Vertex Exam by:: Lanice Shirts CNM  Patient reports pain is 9/10 and requesting pain medication   A: [redacted]w[redacted]d week IUP Gestational Hypertension  Early labor  FHR reactive  P: Admit to labor and delivery  Orders for admission placed  Sharyon Cable, CNM 06/30/2019 4:21 AM

## 2019-06-30 NOTE — Anesthesia Procedure Notes (Signed)
Epidural Patient location during procedure: OB Start time: 06/30/2019 8:01 AM End time: 06/30/2019 8:05 AM  Staffing Anesthesiologist: Janeece Riggers, MD  Preanesthetic Checklist Completed: patient identified, IV checked, site marked, risks and benefits discussed, surgical consent, monitors and equipment checked, pre-op evaluation and timeout performed  Epidural Patient position: sitting Prep: DuraPrep and site prepped and draped Patient monitoring: continuous pulse ox and blood pressure Approach: midline Location: L3-L4 Injection technique: LOR air  Needle:  Needle type: Tuohy  Needle gauge: 17 G Needle length: 9 cm and 9 Needle insertion depth: 5 cm cm Catheter type: closed end flexible Catheter size: 19 Gauge Catheter at skin depth: 10 cm Test dose: negative  Assessment Events: blood not aspirated, injection not painful, no injection resistance, no paresthesia and negative IV test

## 2019-06-30 NOTE — Anesthesia Preprocedure Evaluation (Signed)
Anesthesia Evaluation  Patient identified by MRN, date of birth, ID band Patient awake    Reviewed: Allergy & Precautions, H&P , NPO status , Patient's Chart, lab work & pertinent test results, reviewed documented beta blocker date and time   Airway Mallampati: II  TM Distance: >3 FB Neck ROM: full    Dental no notable dental hx.    Pulmonary neg pulmonary ROS,    Pulmonary exam normal breath sounds clear to auscultation       Cardiovascular negative cardio ROS Normal cardiovascular exam Rhythm:regular Rate:Normal     Neuro/Psych negative neurological ROS  negative psych ROS   GI/Hepatic negative GI ROS, Neg liver ROS,   Endo/Other  negative endocrine ROS  Renal/GU negative Renal ROS  negative genitourinary   Musculoskeletal   Abdominal   Peds  Hematology negative hematology ROS (+)   Anesthesia Other Findings   Reproductive/Obstetrics (+) Pregnancy                             Anesthesia Physical Anesthesia Plan  ASA: II  Anesthesia Plan: Epidural   Post-op Pain Management:    Induction:   PONV Risk Score and Plan:   Airway Management Planned:   Additional Equipment:   Intra-op Plan:   Post-operative Plan:   Informed Consent: I have reviewed the patients History and Physical, chart, labs and discussed the procedure including the risks, benefits and alternatives for the proposed anesthesia with the patient or authorized representative who has indicated his/her understanding and acceptance.     Dental Advisory Given  Plan Discussed with: Anesthesiologist and CRNA  Anesthesia Plan Comments: (Labs checked- platelets confirmed with RN in room. Fetal heart tracing, per RN, reported to be stable enough for sitting procedure. Discussed epidural, and patient consents to the procedure:  included risk of possible headache,backache, failed block, allergic reaction, and nerve  injury. This patient was asked if she had any questions or concerns before the procedure started.)        Anesthesia Quick Evaluation  

## 2019-06-30 NOTE — MAU Note (Signed)
Patient reports contractions that feel back to back since 0000.  No VB/gushes of fluid.  Reports feeling pressure.  Endorses + FM.  1.5 cm on last exam.

## 2019-06-30 NOTE — Progress Notes (Signed)
Labor Note  S: comfortable s/p epidural, occ pressure.   O: BP 100/66   Pulse 80   Temp 98.7 F (37.1 C) (Oral)   Resp 17   Ht 5\' 10"  (1.778 m)   Wt 109.9 kg   SpO2 99%   BMI 34.76 kg/m  CE: Clear AROM @ 0935,  3/60/-3 FHR: Baseline 130, +accels, -decels, mod variability TOCO 3-25m  A/P: This is a 33 y.o. G2P1001 at [redacted]w[redacted]d  admitted in early labor, GSB neg. Elevated BP on admission, PreE labs WNL FWB: Category 1 MWB: comfortable s/p epidural Labor course: Latent labor, will augment with pitocin  Anticipate SVD

## 2019-06-30 NOTE — Progress Notes (Signed)
Labor Note  S: Inc pressure w/ ctx  O: BP 131/83   Pulse 95   Temp 99.4 F (37.4 C)   Resp 18   Ht 5\' 10"  (1.778 m)   Wt 109.9 kg   SpO2 99%   BMI 34.76 kg/m  CE: 7/80/-2 FHR: Baseline 145, + accel, occ early decels, min to mod var TOCO 3-47m, pitocin at 24 mU/min, MVUs  A/P: This is a 33 y.o. G2P1001 at [redacted]w[redacted]d  admitted in early labor, GSB neg. Elevated BP on admission, PreE labs WNL FWB: Category 1 MWB: comfortable s/p epidural Labor course: active labor, continue pitocin, IUPC in place  Anticipate SVD

## 2019-07-01 ENCOUNTER — Encounter (HOSPITAL_COMMUNITY): Payer: Self-pay | Admitting: Obstetrics and Gynecology

## 2019-07-01 ENCOUNTER — Other Ambulatory Visit (HOSPITAL_COMMUNITY): Payer: PRIVATE HEALTH INSURANCE

## 2019-07-01 LAB — CBC
HCT: 35.5 % — ABNORMAL LOW (ref 36.0–46.0)
Hemoglobin: 11.6 g/dL — ABNORMAL LOW (ref 12.0–15.0)
MCH: 27.4 pg (ref 26.0–34.0)
MCHC: 32.7 g/dL (ref 30.0–36.0)
MCV: 83.7 fL (ref 80.0–100.0)
Platelets: 248 10*3/uL (ref 150–400)
RBC: 4.24 MIL/uL (ref 3.87–5.11)
RDW: 13.1 % (ref 11.5–15.5)
WBC: 17.8 10*3/uL — ABNORMAL HIGH (ref 4.0–10.5)
nRBC: 0 % (ref 0.0–0.2)

## 2019-07-01 NOTE — Progress Notes (Signed)
Patient ID: Alexandra [name], female   DOB: [birth year], 33 y.o.   MRN: [id number] PPD#1 Pt doing well. Ambulating, voiding, tolerating diet well with no complaints. Pain well controlled with ibuprofen. She is bottle-feeding and bonding well with Baby Thedora Hinders.  VSS : 119-133/80-84, 80 GEN - NAD ABD - FF and 4cm below umbilicus EXT - trace edema only, no homans  17.8>11.6<248  A/P: PPD#1 s/p svd - stable         Routine pp care         She would like discharge to home early tomorrow am

## 2019-07-01 NOTE — Lactation Note (Signed)
This note was copied from a baby's chart. Lactation Consultation Note  Patient Name: Girl Alaijah Gibler Today's Date: 07/01/2019    [facility] Note:  Per MD, mother's feeding preference is formula.  No LC services will be needed.   Maternal Data    Feeding Feeding Type: Bottle Fed - Formula Nipple Type: Slow - flow  LATCH Score                   Interventions    Lactation Tools Discussed/Used     Consult Status      Gowri Suchan R Annick Dimaio 07/01/2019, 4:05 PM

## 2019-07-01 NOTE — Anesthesia Postprocedure Evaluation (Signed)
Anesthesia Post Note  Patient: Alexandra [name]  Procedure(s) Performed: AN AD HOC LABOR EPIDURAL     Patient location during evaluation: Mother Baby Anesthesia Type: Epidural Level of consciousness: awake Pain management: satisfactory to patient Vital Signs Assessment: post-procedure vital signs reviewed and stable Respiratory status: spontaneous breathing Cardiovascular status: stable Anesthetic complications: no    Last Vitals:  Vitals:   07/01/19 0013 07/01/19 0431  BP: 119/80 133/85  Pulse: 80 79  Resp: 18 18  Temp: 36.8 C 36.8 C  SpO2: 97%     Last Pain:  Vitals:   07/01/19 0820  TempSrc:   PainSc: 0-No pain   Pain Goal:                   [employer]

## 2019-07-01 NOTE — Progress Notes (Signed)
MOB was referred for history of depression/anxiety. * Referral screened out by Clinical Social Worker because none of the following criteria appear to apply: ~ History of anxiety/depression during this pregnancy, or of post-partum depression following prior delivery. ~ Diagnosis of anxiety and/or depression within last 3 years. Per further chart review, MOB diagnosed with anxiety in 2015.  OR * MOB's symptoms currently being treated with medication and/or therapy.    Please contact the Clinical Social Worker if needs arise, by MOB request, or if MOB scores greater than 9/yes to question 10 on Edinburgh Postpartum Depression Screen.    Hokulani Rogel S. Keelon Zurn, MSW, LCSW [facility] [phone]   

## 2019-07-02 MED ORDER — IBUPROFEN 600 MG PO TABS: 600.0000 mg | ORAL_TABLET | Freq: Four times a day (QID) | ORAL | 1 refills | Status: AC

## 2019-07-02 MED ORDER — PRENATAL PLUS 27-1 MG PO TABS: 1.0000 | ORAL_TABLET | Freq: Every day | ORAL | 3 refills | Status: AC

## 2019-07-02 NOTE — Progress Notes (Signed)
Post Partum Day 2 Subjective: no complaints, up ad lib, voiding, tolerating PO and nl lochia, pain controlled  Objective: Blood pressure 130/81, pulse (!) 53, temperature 97.6 F (36.4 C), temperature source Oral, resp. rate 18, height 5\' 10"  (1.778 m), weight 109.9 kg, SpO2 98 %, unknown if currently breastfeeding.  Physical Exam:  General: alert and no distress Lochia: appropriate Uterine Fundus: firm   Recent Labs    06/30/19 0248 07/01/19 0638  HGB 11.7* 11.6*  HCT 34.7* 35.5*    Assessment/Plan: Discharge home  D/C with Motrin and PNV.  F/u 6 weeks   LOS: 2 days   Alexandra [name] 07/02/2019, 8:44 AM

## 2019-07-02 NOTE — Discharge Summary (Signed)
OB Discharge Summary     Patient Name: Alexandra [name] DOB: 06-04-1986 MRN: [id number]  Date of admission: 06/30/2019 Delivering MD: Eula Flax M   Date of discharge: 07/02/2019  Admitting diagnosis: Gestational hypertension, third trimester [O13.3] Intrauterine pregnancy: [redacted]w[redacted]d     Secondary diagnosis:  Active Problems:   Gestational hypertension, third trimester  Additional problems: N/A     Discharge diagnosis: Term Pregnancy Delivered                                                                                                Post partum procedures:N/A  Augmentation: AROM and Pitocin  Complications: None  Hospital course:  Onset of Labor With Vaginal Delivery     34 y.o. yo [id number] at [redacted]w[redacted]d was admitted in Latent Labor on 06/30/2019. Patient had an uncomplicated labor course as follows:  Membrane Rupture Time/Date: 9:33 AM ,06/30/2019   Intrapartum Procedures: Episiotomy: None [1]                                         Lacerations:  Periurethral [8]  Patient had a delivery of a Viable infant. 06/30/2019  Information for the patient's newborn:  Alexandra, [name] [[id number]]  Delivery Method: Vaginal, Spontaneous(Filed from Delivery Summary)     Pateint had an uncomplicated postpartum course.  She is ambulating, tolerating a regular diet, passing flatus, and urinating well. Patient is discharged home in stable condition on 07/02/19.   Physical exam  Vitals:   07/01/19 0431 07/01/19 1433 07/01/19 2100 07/02/19 0500  BP: 133/85 118/86 114/88 130/81  Pulse: 79 68 67 (!) 53  Resp: 18 18 18 18   Temp: 98.2 F (36.8 C) 97.7 F (36.5 C) 98.3 F (36.8 C) 97.6 F (36.4 C)  TempSrc: Oral Oral  Oral  SpO2:   98% 98%  Weight:      Height:       General: alert and no distress Lochia: appropriate Uterine Fundus: firm  Labs: Lab Results  Component Value Date   WBC 17.8 (H) 07/01/2019   HGB 11.6 (L) 07/01/2019   HCT 35.5 (L) 07/01/2019   MCV 83.7  07/01/2019   PLT 248 07/01/2019   CMP Latest Ref Rng & Units 06/30/2019  Glucose 70 - 99 mg/dL 90  BUN 6 - 20 mg/dL 7  Creatinine 0.44 - 1.00 mg/dL 0.93  Sodium 135 - 145 mmol/L 139  Potassium 3.5 - 5.1 mmol/L 3.7  Chloride 98 - 111 mmol/L 108  CO2 22 - 32 mmol/L 22  Calcium 8.9 - 10.3 mg/dL 9.1  Total Protein 6.5 - 8.1 g/dL 5.5(L)  Total Bilirubin 0.3 - 1.2 mg/dL 0.2(L)  Alkaline Phos 38 - 126 U/L 124  AST 15 - 41 U/L 22  ALT 0 - 44 U/L 14    Discharge instruction: per After Visit Summary and "Baby and Me Booklet".  After visit meds:  Allergies as of 07/02/2019      Reactions   Elmiron [pentosan Polysulfate Sodium] Swelling  Lexapro [escitalopram Oxalate] Other (See Comments)   Aggravated migraines  2015   Septra [bactrim] Rash   Sulfa Antibiotics Rash      Medication List    STOP taking these medications   HYDROcodone-acetaminophen 10-325 MG tablet Commonly known as: NORCO   predniSONE 20 MG tablet Commonly known as: DELTASONE     TAKE these medications   albuterol 108 (90 Base) MCG/ACT inhaler Commonly known as: VENTOLIN HFA Inhale 2 puffs into the lungs every 6 (six) hours as needed.   amphetamine-dextroamphetamine 20 MG tablet Commonly known as: Adderall Take 1 tablet (20 mg total) by mouth 2 (two) times daily.   carisoprodol 350 MG tablet Commonly known as: SOMA   cetirizine 10 MG tablet Commonly known as: ZYRTEC Take 10 mg by mouth daily.   diphenhydrAMINE 25 MG tablet Commonly known as: BENADRYL Take 25 mg by mouth every 6 (six) hours as needed.   Emgality 120 MG/ML Soaj Generic drug: Galcanezumab-gnlm Inject 120 mg into the skin every 30 (thirty) days.   Fioricet 50-300-40 MG Caps Generic drug: Butalbital-APAP-Caffeine Take 1 capsule by mouth at bedtime.   gabapentin 800 MG tablet Commonly known as: NEURONTIN Take 800 mg by mouth 5 (five) times daily.   ibuprofen 600 MG tablet Commonly known as: ADVIL Take 1 tablet (600 mg total) by  mouth every 6 (six) hours.   montelukast 10 MG tablet Commonly known as: SINGULAIR Take 1 tablet (10 mg total) by mouth at bedtime.   ondansetron 8 MG tablet Commonly known as: ZOFRAN Take 1 tablet as needed at the onset of nausea   pantoprazole 40 MG tablet Commonly known as: PROTONIX Take 40 mg by mouth daily.   prenatal vitamin w/FE, FA 27-1 MG Tabs tablet Take 1 tablet by mouth daily at 12 noon.       Diet: routine diet  Activity: Advance as tolerated. Pelvic rest for 6 weeks.   Outpatient follow up:4 weeks Follow up Appt:No future appointments. Follow up Visit:No follow-ups on file.  Postpartum contraception: Undecided  Newborn Data: Live born female  Birth Weight: 7 lb 15.2 oz (3606 g) APGAR: 9, 9  Newborn Delivery   Birth date/time: 06/30/2019 21:01:00 Delivery type: Vaginal, Spontaneous      Baby Feeding: Bottle Disposition:home with mother   07/02/2019 Alexandra Rein, MD

## 2019-07-04 ENCOUNTER — Inpatient Hospital Stay (HOSPITAL_COMMUNITY)
Admission: AD | Admit: 2019-07-04 | Payer: PRIVATE HEALTH INSURANCE | Source: Home / Self Care | Admitting: Obstetrics and Gynecology

## 2019-07-04 ENCOUNTER — Inpatient Hospital Stay (HOSPITAL_COMMUNITY): Payer: PRIVATE HEALTH INSURANCE

## 2024-03-05 ENCOUNTER — Other Ambulatory Visit (HOSPITAL_COMMUNITY): Payer: Self-pay

## 2024-07-13 ENCOUNTER — Other Ambulatory Visit (HOSPITAL_COMMUNITY): Payer: Self-pay
